# Patient Record
Sex: Female | Born: 1965 | Race: White | Hispanic: No | Marital: Married | Smoking: Former smoker
Health system: Southern US, Community
[De-identification: ages and names within clinical notes are randomized; demographics above are authoritative.]

## PROBLEM LIST (undated history)

## (undated) DIAGNOSIS — M797 Fibromyalgia: Secondary | ICD-10-CM

## (undated) DIAGNOSIS — G4733 Obstructive sleep apnea (adult) (pediatric): Secondary | ICD-10-CM

## (undated) DIAGNOSIS — M1990 Unspecified osteoarthritis, unspecified site: Secondary | ICD-10-CM

## (undated) DIAGNOSIS — G629 Polyneuropathy, unspecified: Secondary | ICD-10-CM

## (undated) DIAGNOSIS — J45909 Unspecified asthma, uncomplicated: Secondary | ICD-10-CM

---

## 2004-09-22 ENCOUNTER — Emergency Department: Payer: Self-pay | Admitting: Emergency Medicine

## 2004-12-11 ENCOUNTER — Emergency Department: Payer: Self-pay | Admitting: Emergency Medicine

## 2005-06-23 ENCOUNTER — Emergency Department: Payer: Self-pay | Admitting: Emergency Medicine

## 2005-06-23 ENCOUNTER — Ambulatory Visit: Payer: Self-pay | Admitting: Emergency Medicine

## 2005-06-23 IMAGING — US US EXTREM LOW VENOUS*R*
1 series · 17 of 24 positions shown · non-contrast
Comparison: none

REASON FOR EXAM: Right lower extremity pain, evaluate for DVT
COMMENTS:

[Series 1: us extrem low venous*right* · 17 of 27 slices shown]
[im 1/27]
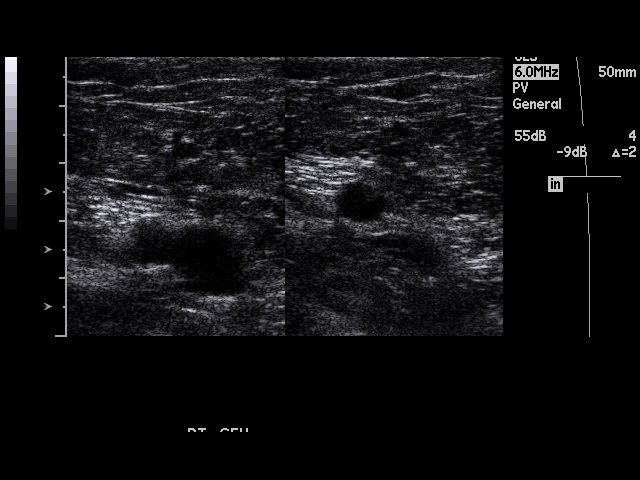
[im 3/27]
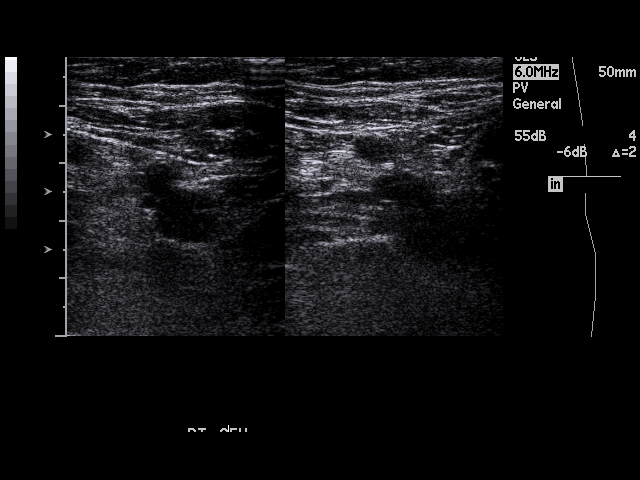
[im 4/27]
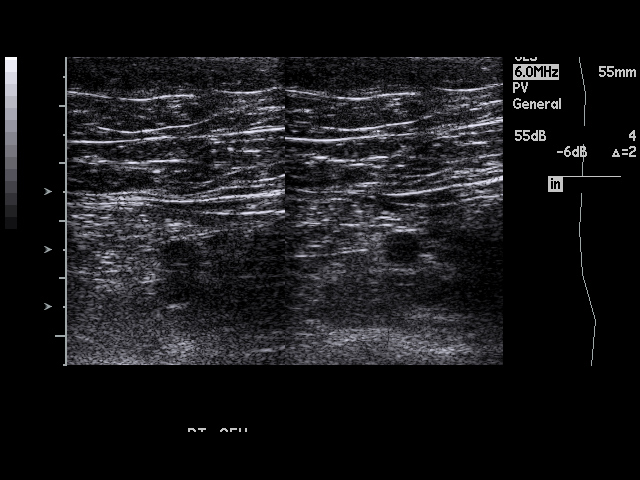
[im 5/27]
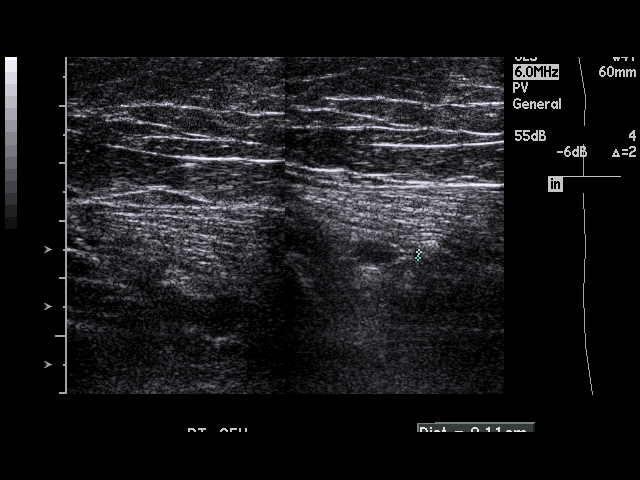
[im 7/27]
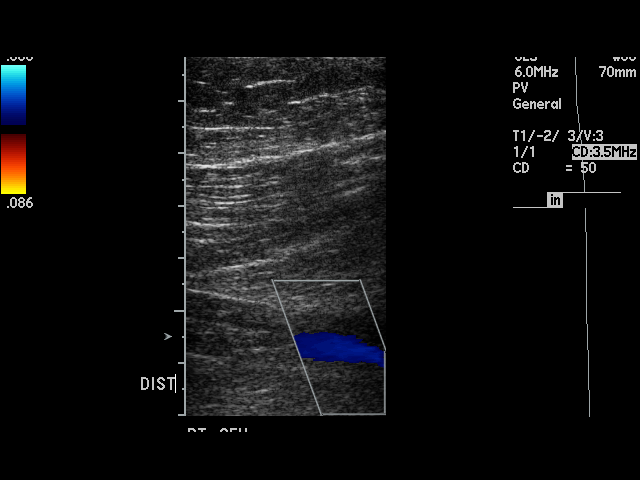
[im 8/27]
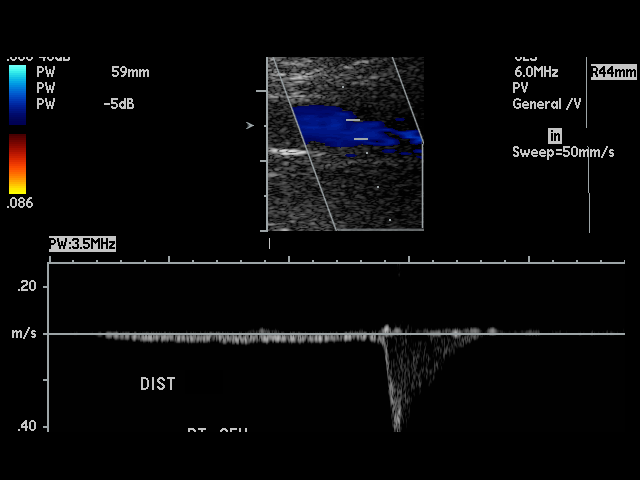
[im 11/27]
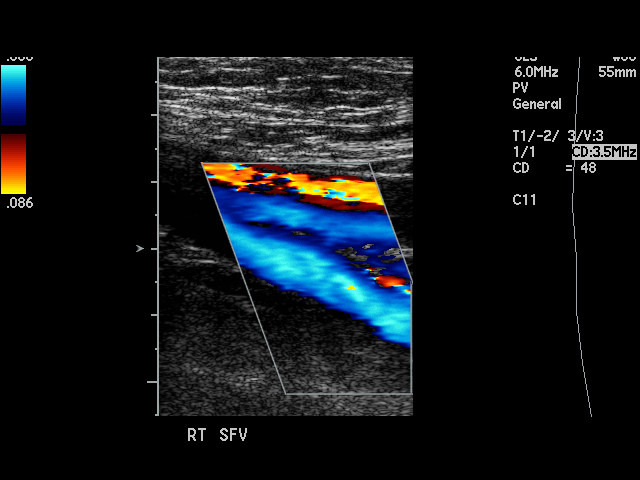
[im 12/27]
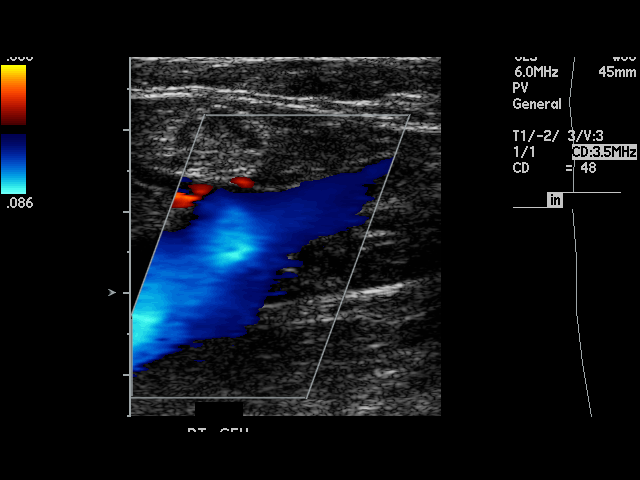
[im 14/27]
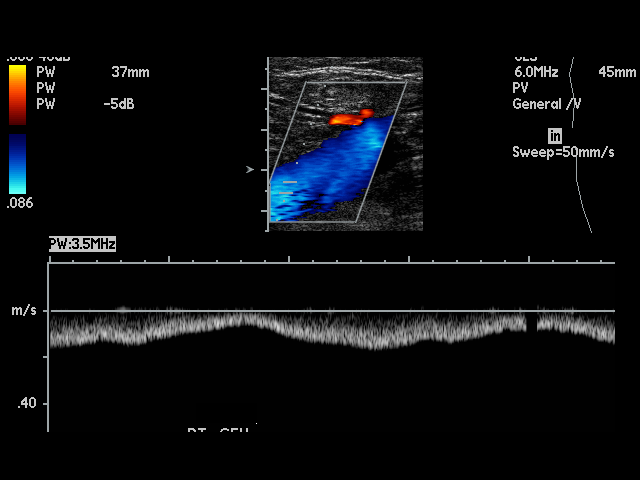
[im 15/27]
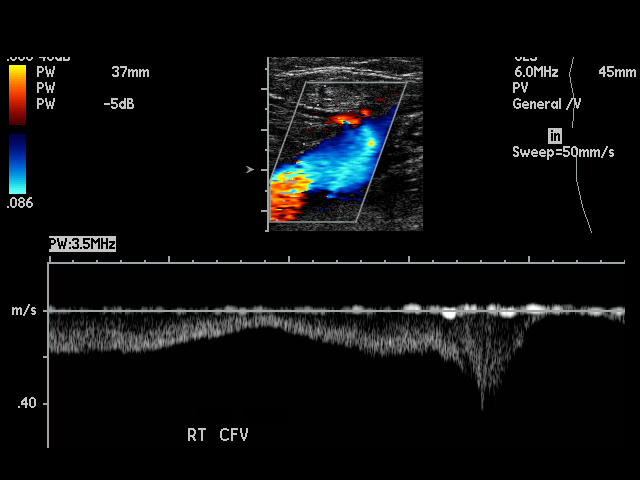
[im 16/27]
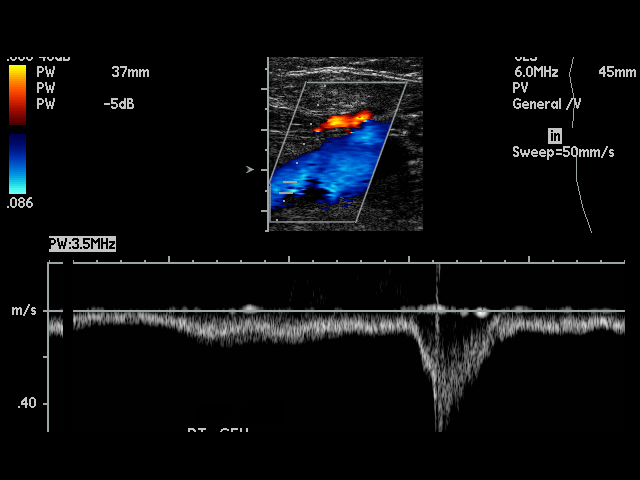
[im 19/27]
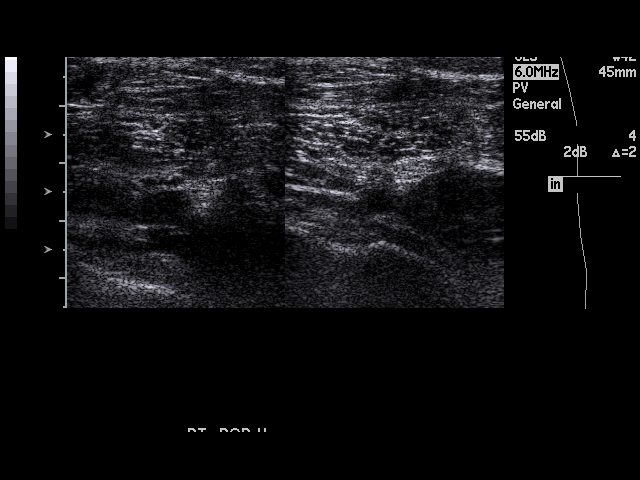
[im 20/27]
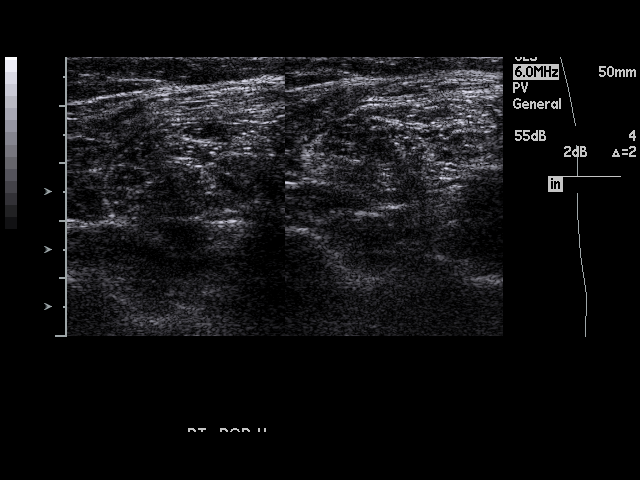
[im 22/27]
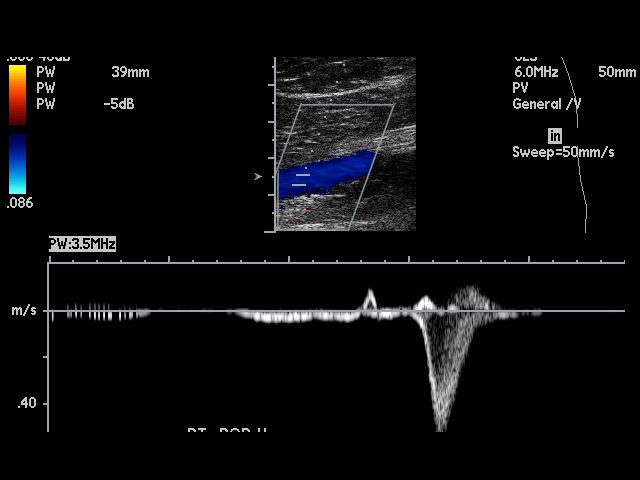
[im 23/27]
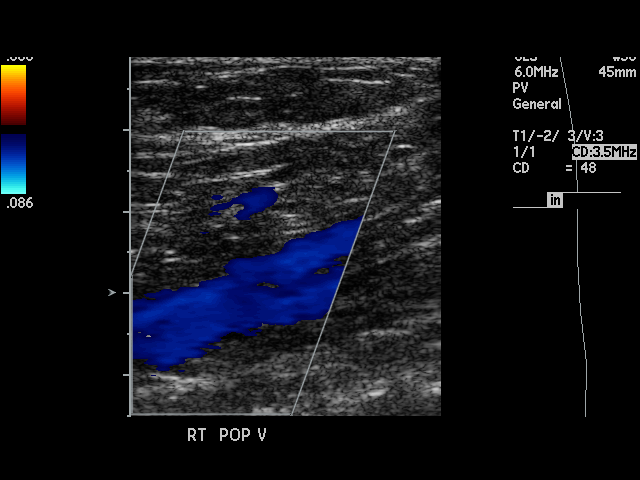
[im 24/27]
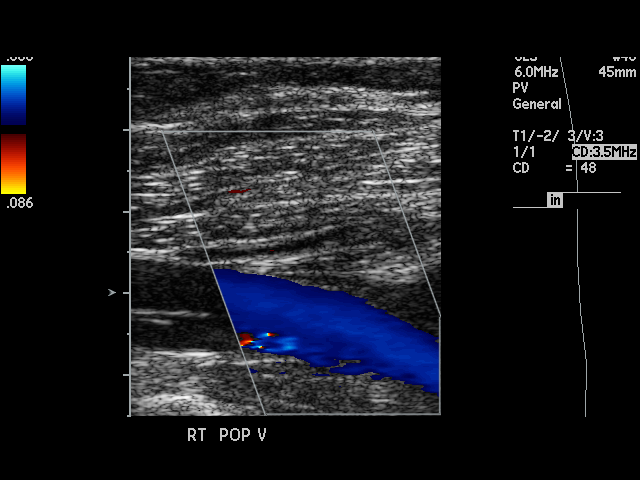
[im 27/27]
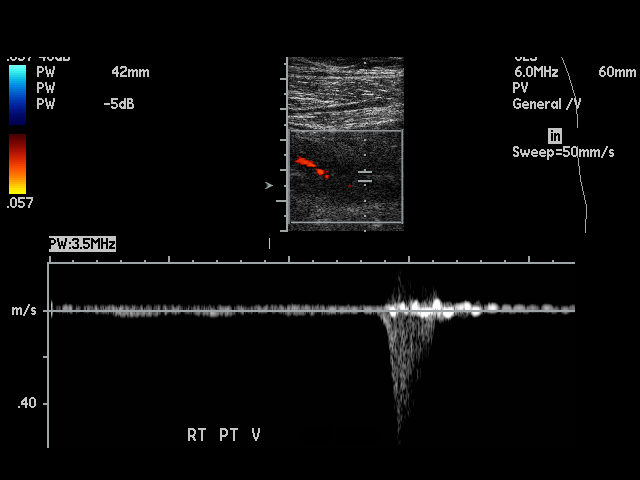

[17 of 24 positions shown; findings below may reference images not displayed]

PROCEDURE:     US  - US DOPPLER LOW EXTR RIGHT  - [DATE] [DATE]

RESULT:     RIGHT lower extremity deep venous ultrasound and color Doppler
examination is performed.

The phasic, augmentation and Valsalva flow waveforms are normal. The RIGHT
femoral and popliteal vein shows normal compressibility. Doppler examination
shows no occlusion or evidence of deep venous thrombosis.
IMPRESSION: Normal study. No deep venous thrombosis is identified in the
RIGHT lower extremity.

## 2005-07-09 ENCOUNTER — Ambulatory Visit: Payer: Self-pay | Admitting: Family Medicine

## 2010-03-08 ENCOUNTER — Encounter: Payer: Self-pay | Admitting: Neurology

## 2010-11-19 ENCOUNTER — Ambulatory Visit: Payer: Self-pay | Admitting: Internal Medicine

## 2010-11-19 IMAGING — CR DG KNEE COMPLETE 4+V*R*
1 series · 5 of 5 positions shown · non-contrast
Comparison: none

REASON FOR EXAM: pain of rt Knee
COMMENTS:

PROCEDURE:     DXR - DXR KNEE RT COMP WITH OBLIQUES  - [DATE]  [DATE]
RESULT:     Images of the right knee demonstrate no evidence of fracture,
dislocation or radiopaque foreign body. Moderate DJD is demonstrated.

[Series 1: view not recorded · 0.17mm/px · 5 of 5 slices shown]
[im 1/5]
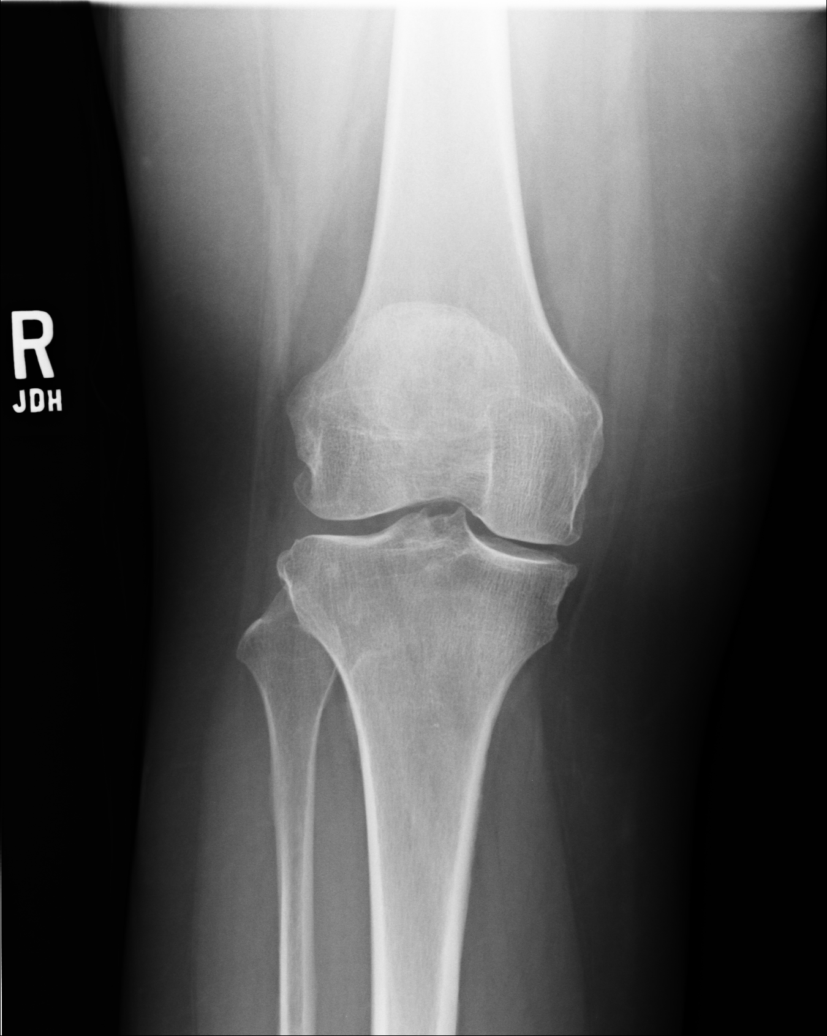
[im 2/5]
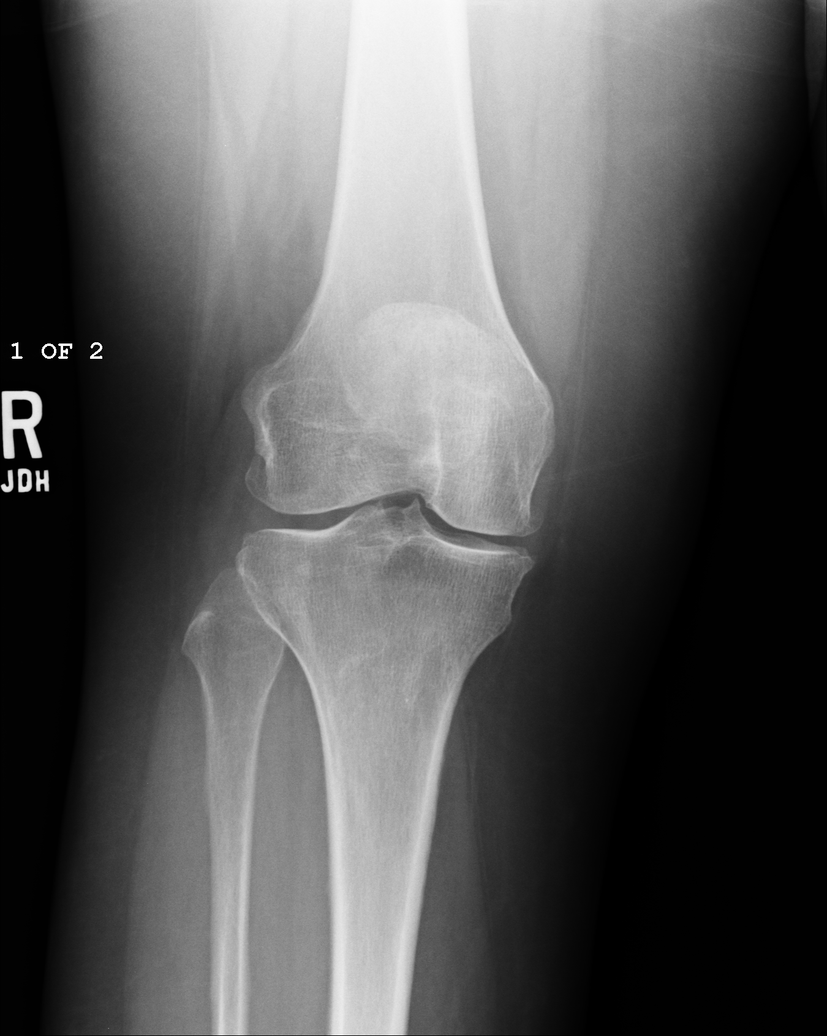
[im 3/5]
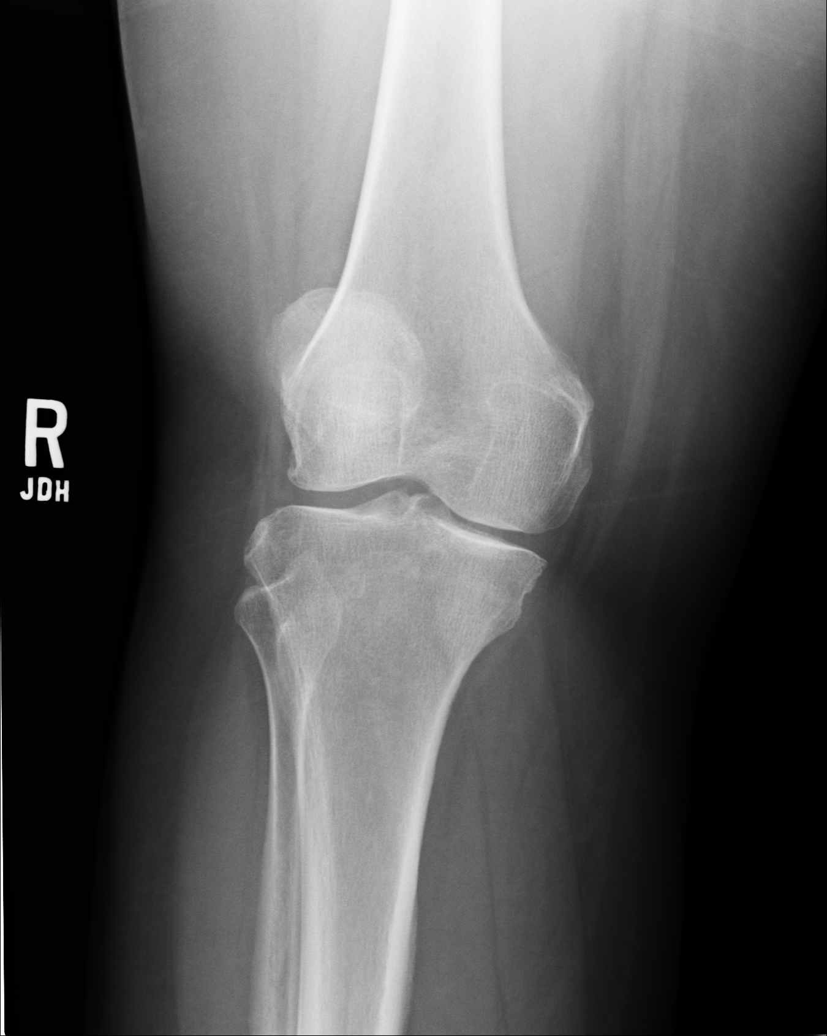
[im 4/5]
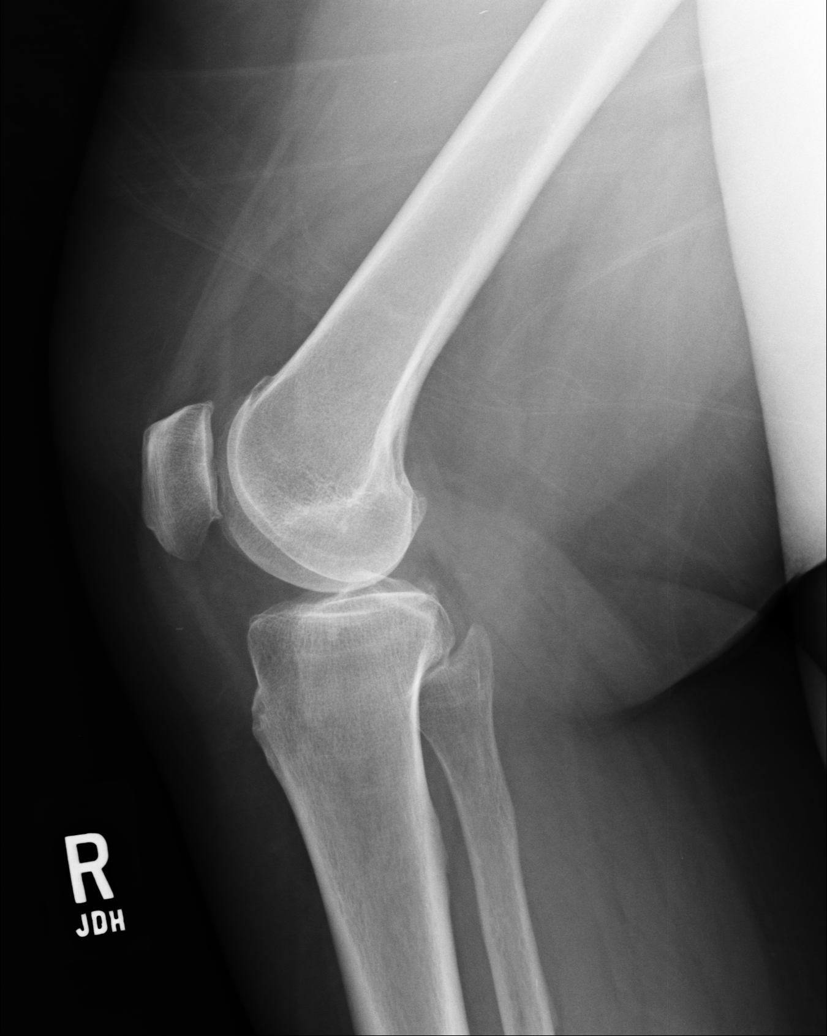
[im 5/5]
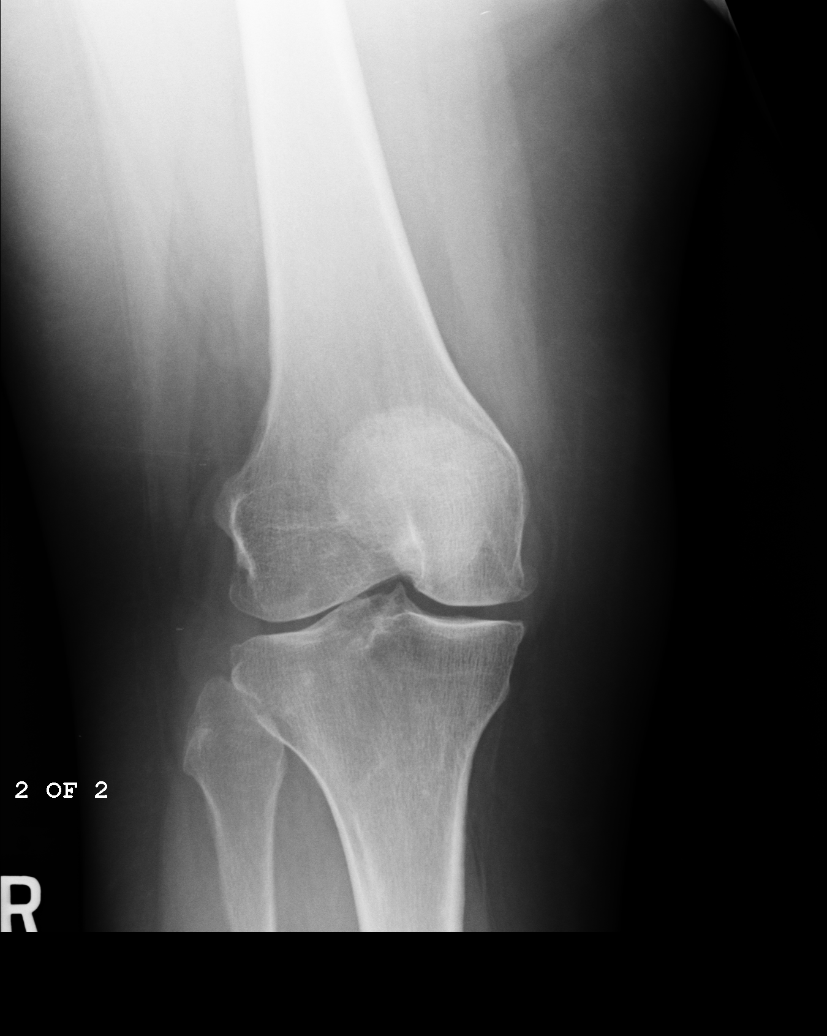

[5 of 5 positions shown; findings below may reference images not displayed]

IMPRESSION: Please see above.

## 2011-10-13 ENCOUNTER — Ambulatory Visit: Payer: Self-pay

## 2011-10-13 IMAGING — CR DG LUMBAR SPINE 2-3V
1 series · 3 of 3 positions shown · non-contrast
Comparison: none

REASON FOR EXAM: Chronic Pain, Back and Balance problems, FAX TO
[REDACTED]
COMMENTS:

PROCEDURE:     DXR - DXR LUMBAR SPINE AP AND LATERAL  - [DATE]  [DATE]
RESULT:     Lumbar spine images show grossly normal alignment. Vertebral
body heights are maintained. Minimal hypertrophic degenerative endplate
spurring is present. Facet hypertrophy is present in the lower lumbar region.

[Series 1: ap · 0.17mm/px · 3 of 3 slices shown]
[im 1/3]
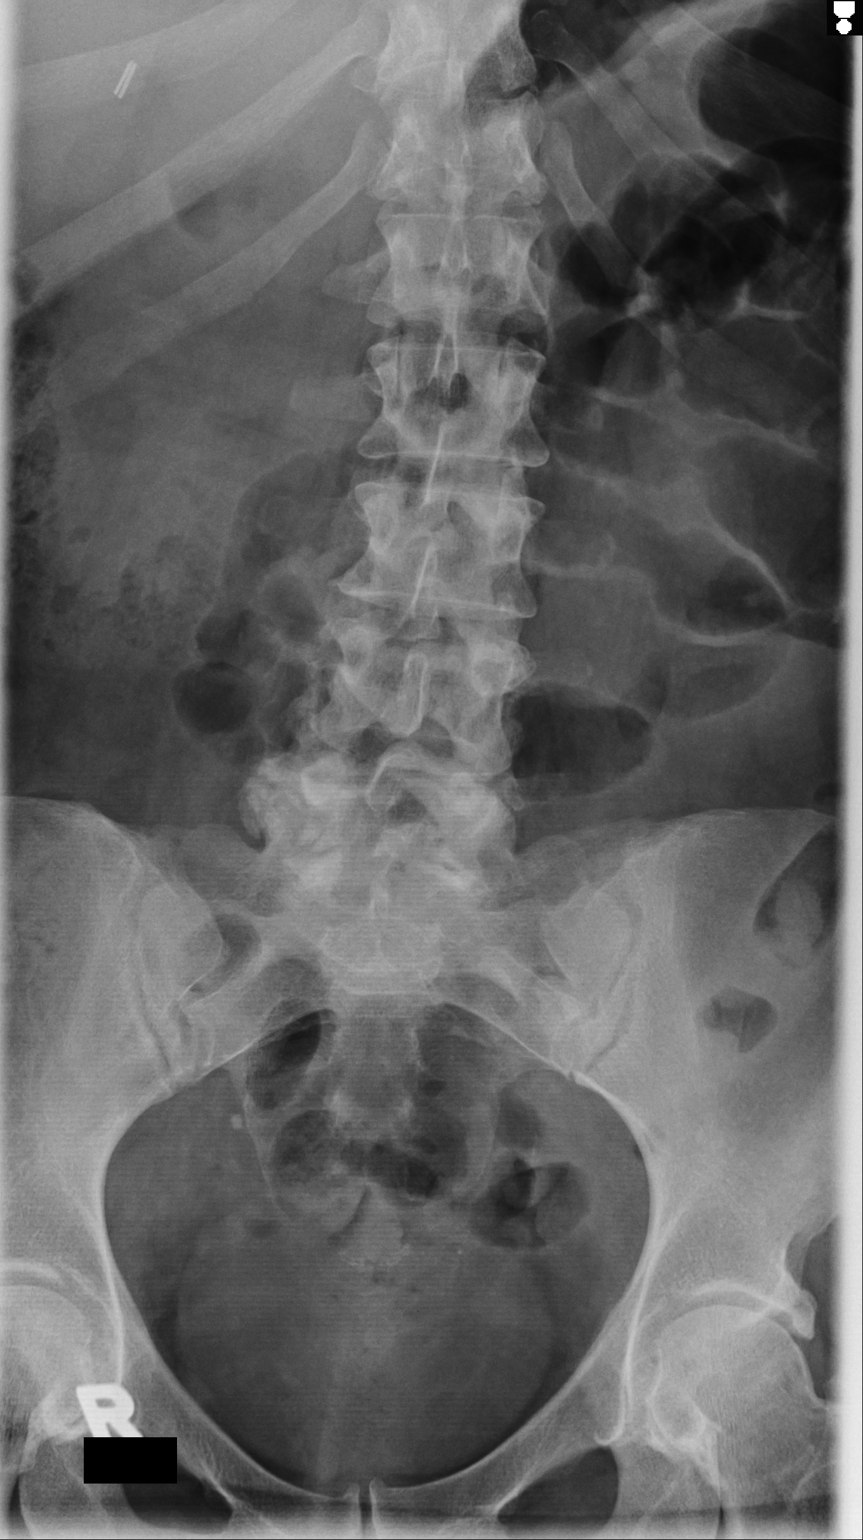
[im 2/3]
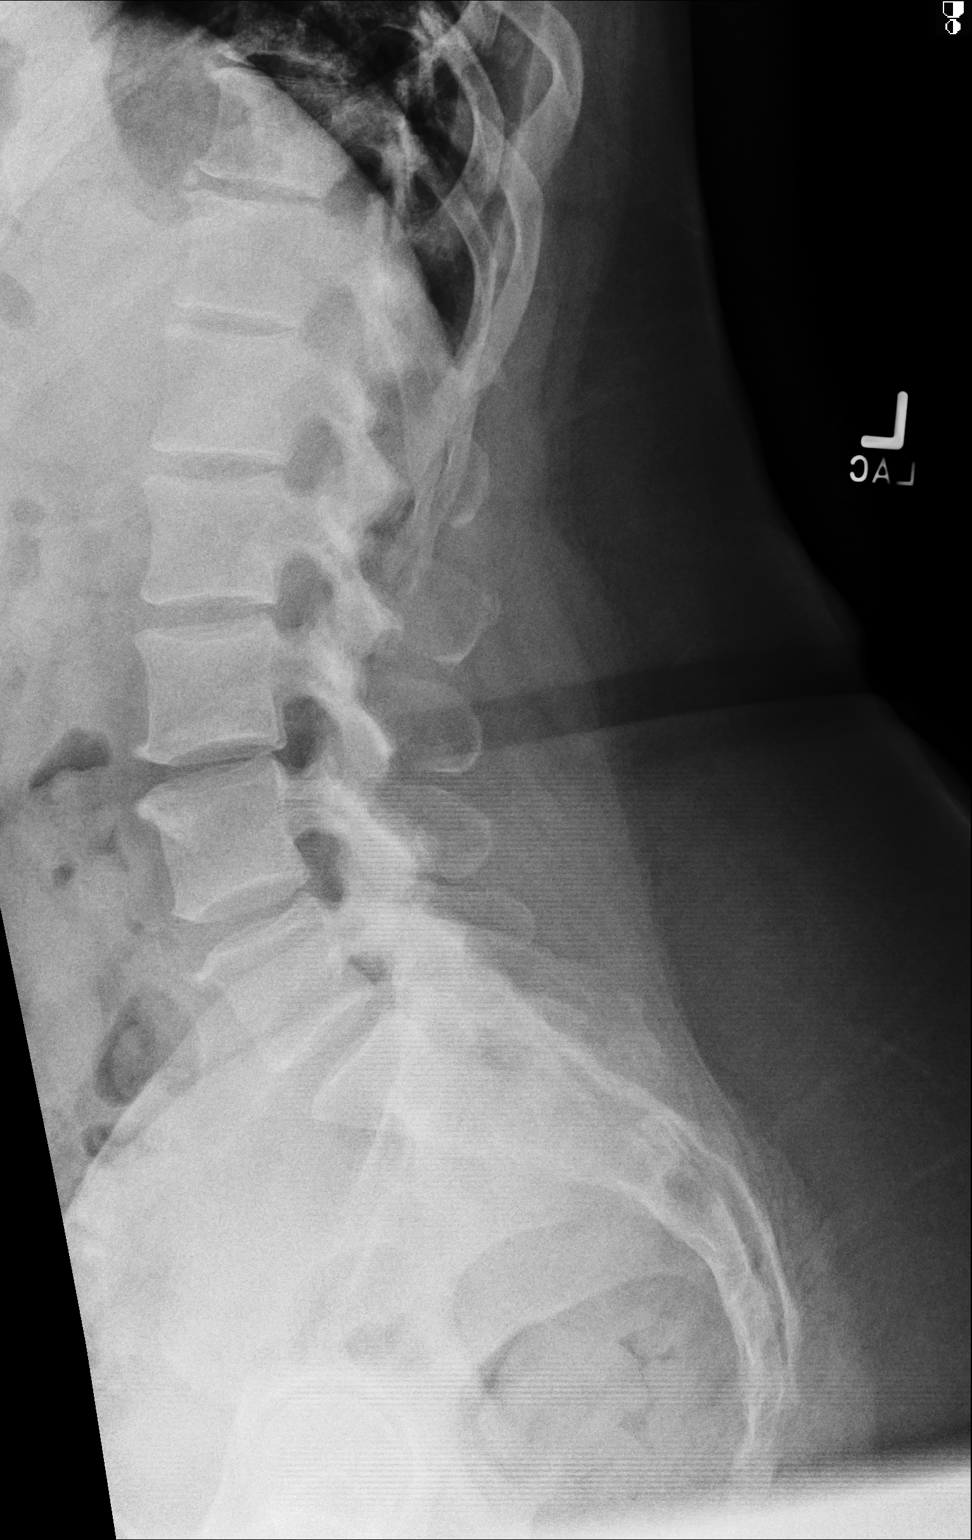
[im 3/3]
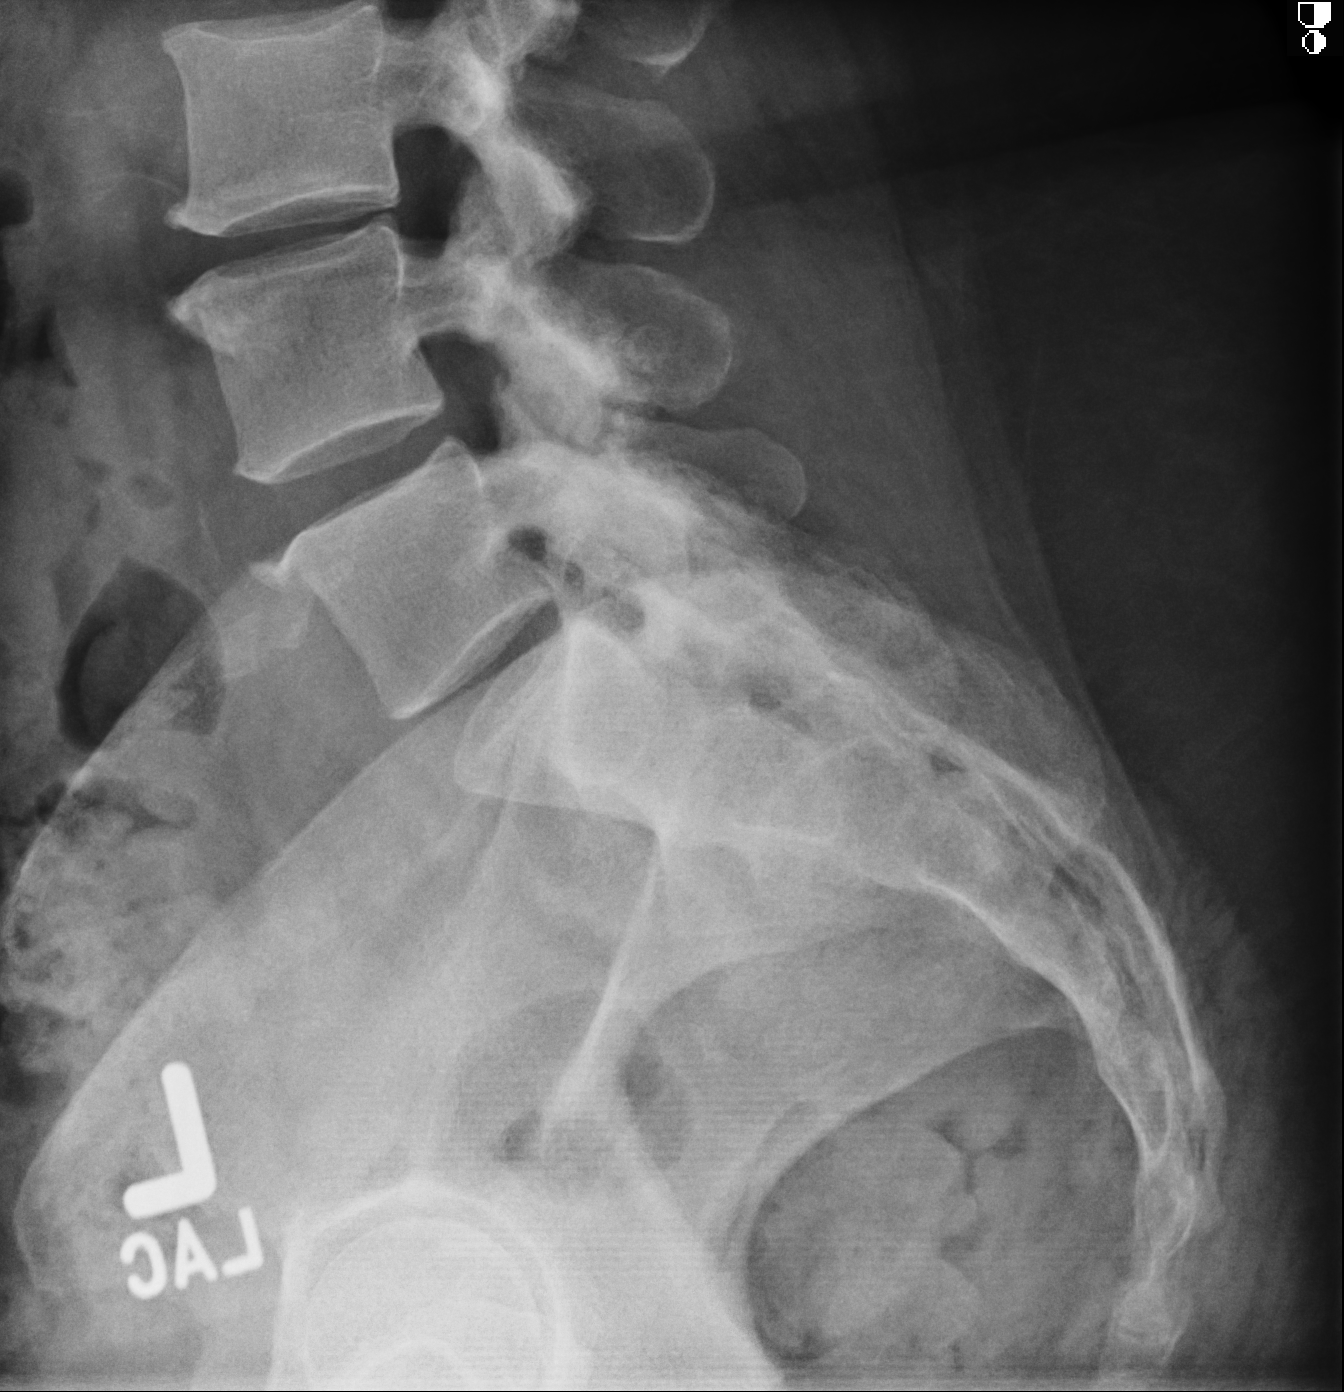

[3 of 3 positions shown; findings below may reference images not displayed]

IMPRESSION: 1. No acute bony abnormality evident.

[REDACTED]

## 2013-04-23 ENCOUNTER — Emergency Department: Payer: Self-pay | Admitting: Emergency Medicine

## 2013-04-23 LAB — URINALYSIS, COMPLETE
BILIRUBIN, UR: NEGATIVE
Blood: NEGATIVE
Glucose,UR: NEGATIVE mg/dL (ref 0–75)
KETONE: NEGATIVE
Leukocyte Esterase: NEGATIVE
Nitrite: NEGATIVE
PROTEIN: NEGATIVE
Ph: 7 (ref 4.5–8.0)
SPECIFIC GRAVITY: 1.004 (ref 1.003–1.030)

## 2013-04-23 LAB — DRUG SCREEN, URINE

## 2013-04-23 LAB — COMPREHENSIVE METABOLIC PANEL
AST: 10 U/L — AB (ref 15–37)
Albumin: 3.3 g/dL — ABNORMAL LOW (ref 3.4–5.0)
Alkaline Phosphatase: 125 U/L — ABNORMAL HIGH
Anion Gap: 4 — ABNORMAL LOW (ref 7–16)
BUN: 7 mg/dL (ref 7–18)
Bilirubin,Total: 0.2 mg/dL (ref 0.2–1.0)
CALCIUM: 9.2 mg/dL (ref 8.5–10.1)
CHLORIDE: 104 mmol/L (ref 98–107)
Co2: 28 mmol/L (ref 21–32)
Creatinine: 0.79 mg/dL (ref 0.60–1.30)
GLUCOSE: 87 mg/dL (ref 65–99)
OSMOLALITY: 269 (ref 275–301)
Potassium: 4 mmol/L (ref 3.5–5.1)
SGPT (ALT): 10 U/L — ABNORMAL LOW (ref 12–78)
SODIUM: 136 mmol/L (ref 136–145)
TOTAL PROTEIN: 8.4 g/dL — AB (ref 6.4–8.2)

## 2013-04-23 LAB — CBC
HCT: 43.8 % (ref 35.0–47.0)
HGB: 15 g/dL (ref 12.0–16.0)
MCH: 32.4 pg (ref 26.0–34.0)
MCHC: 34.1 g/dL (ref 32.0–36.0)
MCV: 95 fL (ref 80–100)
Platelet: 197 10*3/uL (ref 150–440)
RBC: 4.61 10*6/uL (ref 3.80–5.20)
RDW: 15.1 % — ABNORMAL HIGH (ref 11.5–14.5)
WBC: 8.6 10*3/uL (ref 3.6–11.0)

## 2013-04-23 LAB — ETHANOL: Ethanol %: <0.003 % (ref 0.000–0.080)

## 2013-04-23 LAB — ACETAMINOPHEN LEVEL

## 2013-04-23 LAB — SALICYLATE LEVEL: Salicylates, Serum: 5.4 mg/dL — ABNORMAL HIGH

## 2013-12-03 ENCOUNTER — Emergency Department: Payer: Self-pay | Admitting: Emergency Medicine

## 2013-12-03 LAB — CBC
HCT: 39.7 % (ref 35.0–47.0)
HGB: 12.7 g/dL (ref 12.0–16.0)
MCH: 30.6 pg (ref 26.0–34.0)
MCHC: 32.1 g/dL (ref 32.0–36.0)
MCV: 95 fL (ref 80–100)
PLATELETS: 202 10*3/uL (ref 150–440)
RBC: 4.17 10*6/uL (ref 3.80–5.20)
RDW: 16.4 % — ABNORMAL HIGH (ref 11.5–14.5)
WBC: 10.8 10*3/uL (ref 3.6–11.0)

## 2013-12-03 LAB — COMPREHENSIVE METABOLIC PANEL
ANION GAP: 6 — AB (ref 7–16)
AST: 15 U/L (ref 15–37)
Albumin: 3 g/dL — ABNORMAL LOW (ref 3.4–5.0)
Alkaline Phosphatase: 121 U/L — ABNORMAL HIGH
BUN: 4 mg/dL — ABNORMAL LOW (ref 7–18)
Bilirubin,Total: 0.2 mg/dL (ref 0.2–1.0)
CO2: 29 mmol/L (ref 21–32)
Calcium, Total: 8.6 mg/dL (ref 8.5–10.1)
Chloride: 104 mmol/L (ref 98–107)
Creatinine: 0.91 mg/dL (ref 0.60–1.30)
EGFR (Non-African Amer.): >60
Glucose: 108 mg/dL — ABNORMAL HIGH (ref 65–99)
OSMOLALITY: 275 (ref 275–301)
POTASSIUM: 4 mmol/L (ref 3.5–5.1)
SGPT (ALT): 13 U/L — ABNORMAL LOW
Sodium: 139 mmol/L (ref 136–145)
Total Protein: 7.7 g/dL (ref 6.4–8.2)

## 2013-12-03 LAB — URINALYSIS, COMPLETE
BILIRUBIN, UR: NEGATIVE
Blood: NEGATIVE
GLUCOSE, UR: NEGATIVE mg/dL (ref 0–75)
KETONE: NEGATIVE
LEUKOCYTE ESTERASE: NEGATIVE
Nitrite: NEGATIVE
PROTEIN: NEGATIVE
Ph: 6 (ref 4.5–8.0)
SPECIFIC GRAVITY: 1.003 (ref 1.003–1.030)
Squamous Epithelial: 7
WBC UR: 2 /HPF (ref 0–5)

## 2013-12-03 LAB — DRUG SCREEN, URINE
Amphetamines, Ur Screen: NEGATIVE (ref ?–1000)
Barbiturates, Ur Screen: NEGATIVE (ref ?–200)
Benzodiazepine, Ur Scrn: POSITIVE (ref ?–200)
CANNABINOID 50 NG, UR ~~LOC~~: NEGATIVE (ref ?–50)
Cocaine Metabolite,Ur ~~LOC~~: NEGATIVE (ref ?–300)
MDMA (ECSTASY) UR SCREEN: NEGATIVE (ref ?–500)
Methadone, Ur Screen: NEGATIVE (ref ?–300)
OPIATE, UR SCREEN: NEGATIVE (ref ?–300)
Phencyclidine (PCP) Ur S: NEGATIVE (ref ?–25)
TRICYCLIC, UR SCREEN: NEGATIVE (ref ?–1000)

## 2013-12-03 LAB — SALICYLATE LEVEL: Salicylates, Serum: 4.3 mg/dL — ABNORMAL HIGH

## 2013-12-03 LAB — ACETAMINOPHEN LEVEL: Acetaminophen: <2 ug/mL

## 2013-12-03 LAB — ETHANOL: Ethanol: <3 mg/dL (ref 0–80)

## 2014-06-08 NOTE — Consult Note (Signed)
PATIENT NAME:  Brandi Holland, Brandi Holland MR#:  [id number] DATE OF BIRTH:  06/06/1965  DATE OF CONSULTATION:  12/04/2013  REFERRING PHYSICIAN:   CONSULTING PHYSICIAN:  Brandi Amel, MD  IDENTIFYING INFORMATION AND REASON FOR CONSULTATION: A 49 year old woman with a long history of PTSD came to the hospital voluntarily for anxiety symptoms.   HISTORY OF PRESENT ILLNESS: Information obtained from the patient and the chart. The patient says that her flashbacks have been getting worse and she has been feeling more panicky. Feels anxious most of the time. Has panic-like symptoms several times a day. She has flashbacks but she is not psychotic about them. She had a particularly bad one yesterday when smell of kerosene got her upset and agitated and she could not calm down. The patient is not reporting psychotic symptoms. She does not have any suicidal or homicidal ideation whatsoever. She is currently getting follow-up treatment at [facility] regularly and has a doctor and a therapist there. She has not been compliant with the recommended medicine that they have given her.   PAST PSYCHIATRIC HISTORY: The patient denies any past psychiatric hospitalization, has no history of suicide attempts in the past. She is currently taking Seroquel 200 mg at night and Xanax 1 mg twice a day and has been prescribed Paxil but is not taking it. She says she has had other medicines in the past including Latuda, Celexa and Cymbalta which were not helpful.   SOCIAL HISTORY: The patient lives at home with her husband. Feels the husband is supportive, but he works during the day leaving her alone which makes her increasingly anxious. The patient is not able to work outside the home.   FAMILY HISTORY: Sister and mother both have significant mental health problems.   SUBSTANCE ABUSE HISTORY: Does not drink, does not abuse drugs, has no history of alcohol or drug abuse, does smoke cigarettes.   PAST MEDICAL HISTORY: The patient has  chronic pain related to fibromyalgia, is overweight but does not have any other clear medical problems.   CURRENT MEDICATIONS: Seroquel 200 mg at bedtime and Xanax 1 mg b.i.d. Does not report any other medicines.   ALLERGIES: PREVACID, SULFA DRUGS.   REVIEW OF SYSTEMS: The patient is reporting anxiety, nervousness, poor sleep at night, poor energy during the day. Denies suicidal ideation. Denies homicidal ideation. Denies any hallucinations or psychotic symptoms. The rest of the physical review of systems, besides chronic joint pain, is negative.   MENTAL STATUS EXAMINATION: A somewhat disheveled-looking woman, looks her stated age, cooperative with the interview. Eye contact good. Psychomotor activity normal. Speech normal rate, tone and volume. Affect dysphoric and anxious. Mood stated as being nervous. Thoughts are lucid. No loosening of associations or delusions. No evidence of psychotic thinking. Denies suicidal or homicidal ideation. She is alert and oriented x4. She can remember 3/3 objects immediately and at three minutes. Judgment and insight appear to be adequate. Normal intelligence. Normal fund of knowledge.   LABORATORY RESULTS: The pregnancy test is negative. Salicylates just slightly elevated at 4.3. Chemistry panel: Slightly elevated glucose 108, low ALT at 13, elevated alkaline phosphatase 121. CBC normal. Urinalysis unremarkable. Drug screen positive for benzodiazepines.   VITAL SIGNS: In the Emergency Room, blood pressure is 148/75, respirations 16, pulse 85, and temperature 98.6.   ASSESSMENT: A 49 year old woman with posttraumatic stress disorder, panic-like symptoms. No suicidality. No psychosis. No homicidality. No evidence of acute dangerousness to self or others. Has been recommended by her outpatient doctor to take Paxil,  but has resisted because she is afraid of it. She has an [facility] this [facility]. At this point, she does not meet criteria for inpatient  hospitalization.   TREATMENT PLAN: Supportive and educational counseling done. Reviewed use of medicine and encouraged her to try the Paxil. Encouraged her to get in touch with [facility] for questions about her treatment. She can be released from the Emergency Room. Involuntary commitment is not required.   DIAGNOSIS, PRINCIPAL AND PRIMARY:  AXIS I: Posttraumatic stress disorder.   SECONDARY DIAGNOSES: AXIS I: Anxiety disorder, not otherwise specified.  AXIS II: Deferred.  AXIS III: Fibromyalgia.  AXIS V: Functioning at time of evaluation is 49. ____________________________ Brandi AmelJohn T. Edin Kon, MD jtc:sb D: 12/04/2013 17:03:24 ET T: 12/04/2013 17:16:00 ET JOB#: [id number]  cc: Brandi AmelJohn T. Diamonds Lippard, MD, <Dictator> Brandi AmelJOHN T Heidee Audi MD ELECTRONICALLY SIGNED 12/05/2013 16:16
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
## 2020-06-18 ENCOUNTER — Emergency Department
Admission: EM | Admit: 2020-06-18 | Discharge: 2020-06-18 | Disposition: A | Discharge status: Home / Self Care | Payer: Medicaid Other | Attending: Emergency Medicine | Admitting: Emergency Medicine

## 2020-06-18 ENCOUNTER — Other Ambulatory Visit: Payer: Self-pay

## 2020-06-18 ENCOUNTER — Emergency Department: Payer: Medicaid Other

## 2020-06-18 DIAGNOSIS — Z5321 Procedure and treatment not carried out due to patient leaving prior to being seen by health care provider: Secondary | ICD-10-CM | POA: Diagnosis not present

## 2020-06-18 DIAGNOSIS — M7989 Other specified soft tissue disorders: Secondary | ICD-10-CM | POA: Insufficient documentation

## 2020-06-18 LAB — BASIC METABOLIC PANEL
Anion gap: 10 (ref 5–15)
BUN: 19 mg/dL (ref 6–20)
CO2: 22 mmol/L (ref 22–32)
Calcium: 8.8 mg/dL — ABNORMAL LOW (ref 8.9–10.3)
Chloride: 90 mmol/L — ABNORMAL LOW (ref 98–111)
Creatinine, Ser: 1.16 mg/dL — ABNORMAL HIGH (ref 0.44–1.00)
GFR, Estimated: 55 mL/min — ABNORMAL LOW (ref >60)
Glucose, Bld: 144 mg/dL — ABNORMAL HIGH (ref 70–99)
Potassium: 3.9 mmol/L (ref 3.5–5.1)
Sodium: 122 mmol/L — ABNORMAL LOW (ref 135–145)

## 2020-06-18 LAB — CBC
HCT: 29.2 % — ABNORMAL LOW (ref 36.0–46.0)
Hemoglobin: 9.8 g/dL — ABNORMAL LOW (ref 12.0–15.0)
MCH: 31.2 pg (ref 26.0–34.0)
MCHC: 33.6 g/dL (ref 30.0–36.0)
MCV: 93 fL (ref 80.0–100.0)
Platelets: 255 10*3/uL (ref 150–400)
RBC: 3.14 MIL/uL — ABNORMAL LOW (ref 3.87–5.11)
RDW: 14.5 % (ref 11.5–15.5)
WBC: 10.4 10*3/uL (ref 4.0–10.5)
nRBC: 0.2 % (ref 0.0–0.2)

## 2020-06-18 IMAGING — CR DG CHEST 2V
1 series · 2 of 2 positions shown · non-contrast
Comparison: Chest x-ray [DATE].

CLINICAL DATA: 55-year-old female with complaint of bilateral leg
swelling for the past several weeks.

EXAM:
CHEST - 2 VIEW

[Series 1: dg chest 2 view · 0.14mm/px · 2 of 2 slices shown]
[im 1/2]
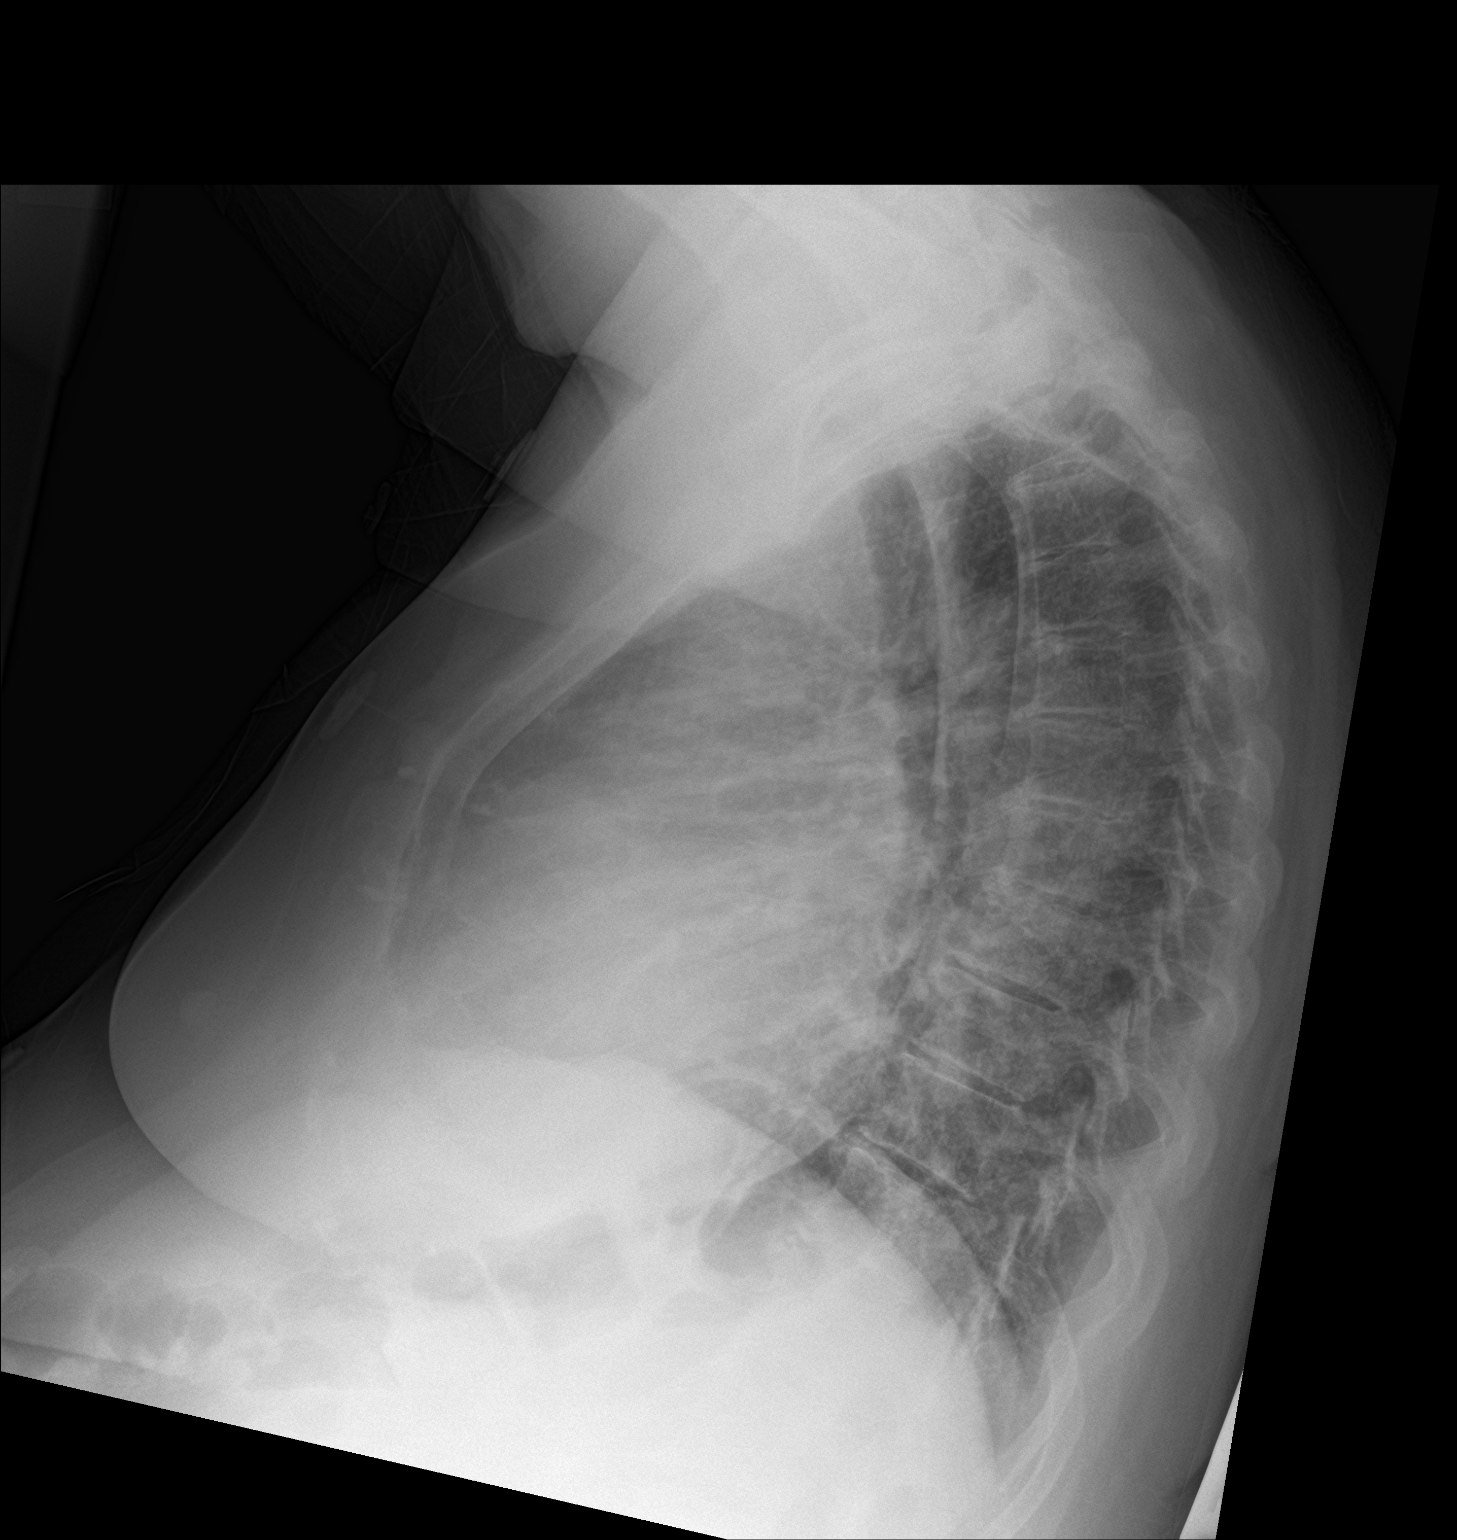
[im 2/2]
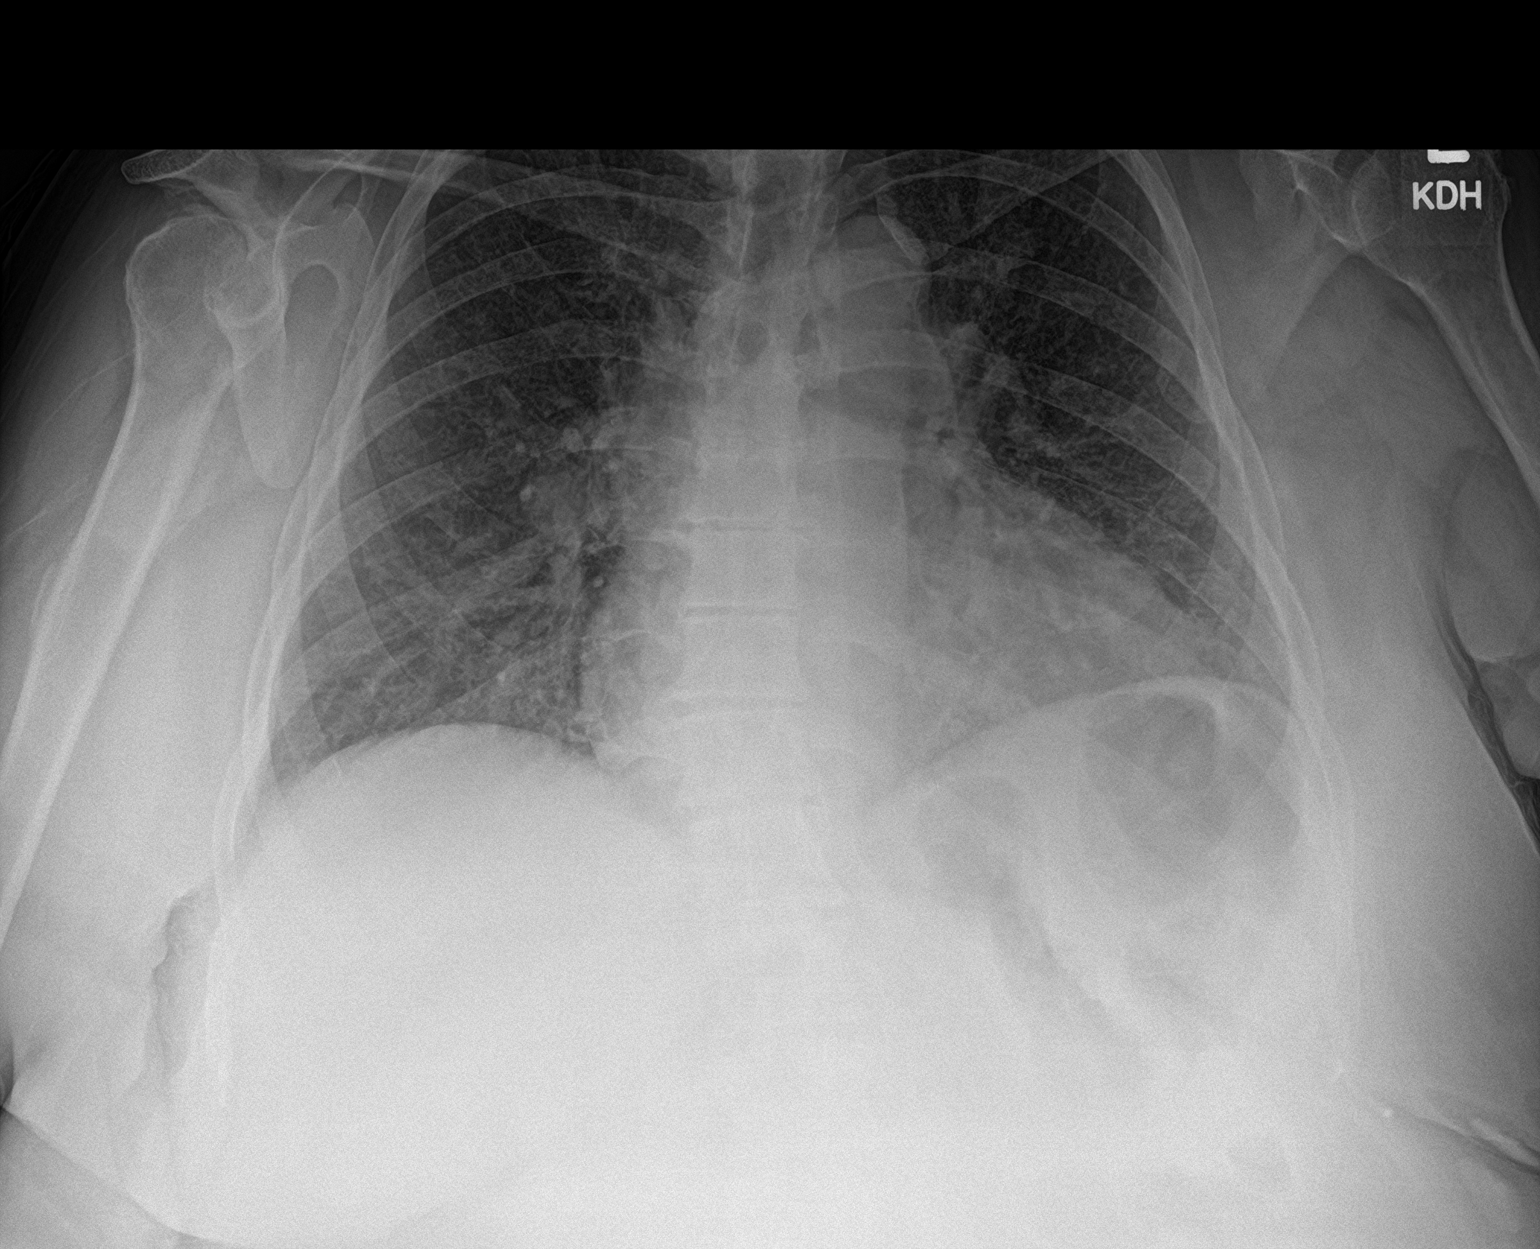

[2 of 2 positions shown; findings below may reference images not displayed]

FINDINGS: Lung volumes are normal. No consolidative airspace disease. There is
cephalization of the pulmonary vasculature and slight indistinctness
of the interstitial markings suggestive of mild pulmonary edema. No
definite pleural effusions. No pneumothorax. No definite suspicious
appearing pulmonary nodules or masses are noted. Mild cardiomegaly.
Upper mediastinal contours are within normal limits.
IMPRESSION: 1. The appearance of the chest suggests mild congestive heart
failure, as above.

## 2020-06-18 NOTE — ED Triage Notes (Signed)
Pt to ED POV for bilateral leg swelling for a couple weeks. Denies cp States she is unsure if an insect bit her but is having swelling in both legs Pain relieved with elevation  Minimal swelling noted in BLE

## 2020-06-18 NOTE — ED Triage Notes (Signed)
First Nurse Note: Arrives from [facility] for ED evaluation of bilateral lower extremity swelling x 1 week.  Patient states the concern was DVT vs CHF.

## 2020-06-20 ENCOUNTER — Telehealth: Payer: Self-pay | Admitting: Emergency Medicine

## 2020-06-20 NOTE — Telephone Encounter (Signed)
Called patient due to left emergency department before provider exam to inquire about condition and follow up plans. No answer and no voicemail 

## 2020-07-16 ENCOUNTER — Emergency Department: Payer: Medicaid Other

## 2020-07-16 ENCOUNTER — Inpatient Hospital Stay
Admission: EM | Admit: 2020-07-16 | Discharge: 2020-08-01 | DRG: 064 | Disposition: A | Discharge status: Home Health | Payer: Medicaid Other | Attending: Internal Medicine | Admitting: Internal Medicine

## 2020-07-16 DIAGNOSIS — F17210 Nicotine dependence, cigarettes, uncomplicated: Secondary | ICD-10-CM | POA: Diagnosis present

## 2020-07-16 DIAGNOSIS — Z9049 Acquired absence of other specified parts of digestive tract: Secondary | ICD-10-CM

## 2020-07-16 DIAGNOSIS — M797 Fibromyalgia: Secondary | ICD-10-CM | POA: Diagnosis present

## 2020-07-16 DIAGNOSIS — E611 Iron deficiency: Secondary | ICD-10-CM | POA: Diagnosis present

## 2020-07-16 DIAGNOSIS — G928 Other toxic encephalopathy: Secondary | ICD-10-CM | POA: Diagnosis present

## 2020-07-16 DIAGNOSIS — Z882 Allergy status to sulfonamides status: Secondary | ICD-10-CM

## 2020-07-16 DIAGNOSIS — I5033 Acute on chronic diastolic (congestive) heart failure: Secondary | ICD-10-CM

## 2020-07-16 DIAGNOSIS — I351 Nonrheumatic aortic (valve) insufficiency: Secondary | ICD-10-CM

## 2020-07-16 DIAGNOSIS — Z9119 Patient's noncompliance with other medical treatment and regimen: Secondary | ICD-10-CM

## 2020-07-16 DIAGNOSIS — E538 Deficiency of other specified B group vitamins: Secondary | ICD-10-CM

## 2020-07-16 DIAGNOSIS — Z8262 Family history of osteoporosis: Secondary | ICD-10-CM

## 2020-07-16 DIAGNOSIS — Z825 Family history of asthma and other chronic lower respiratory diseases: Secondary | ICD-10-CM

## 2020-07-16 DIAGNOSIS — Z23 Encounter for immunization: Secondary | ICD-10-CM

## 2020-07-16 DIAGNOSIS — W06XXXA Fall from bed, initial encounter: Secondary | ICD-10-CM | POA: Diagnosis present

## 2020-07-16 DIAGNOSIS — E874 Mixed disorder of acid-base balance: Secondary | ICD-10-CM | POA: Diagnosis present

## 2020-07-16 DIAGNOSIS — H9190 Unspecified hearing loss, unspecified ear: Secondary | ICD-10-CM | POA: Diagnosis present

## 2020-07-16 DIAGNOSIS — R739 Hyperglycemia, unspecified: Secondary | ICD-10-CM | POA: Diagnosis present

## 2020-07-16 DIAGNOSIS — R7989 Other specified abnormal findings of blood chemistry: Secondary | ICD-10-CM | POA: Diagnosis present

## 2020-07-16 DIAGNOSIS — Z8349 Family history of other endocrine, nutritional and metabolic diseases: Secondary | ICD-10-CM

## 2020-07-16 DIAGNOSIS — E871 Hypo-osmolality and hyponatremia: Secondary | ICD-10-CM

## 2020-07-16 DIAGNOSIS — N179 Acute kidney failure, unspecified: Secondary | ICD-10-CM

## 2020-07-16 DIAGNOSIS — I480 Paroxysmal atrial fibrillation: Secondary | ICD-10-CM | POA: Diagnosis present

## 2020-07-16 DIAGNOSIS — Z823 Family history of stroke: Secondary | ICD-10-CM

## 2020-07-16 DIAGNOSIS — J45901 Unspecified asthma with (acute) exacerbation: Secondary | ICD-10-CM | POA: Diagnosis present

## 2020-07-16 DIAGNOSIS — E785 Hyperlipidemia, unspecified: Secondary | ICD-10-CM | POA: Diagnosis present

## 2020-07-16 DIAGNOSIS — Z881 Allergy status to other antibiotic agents status: Secondary | ICD-10-CM

## 2020-07-16 DIAGNOSIS — Z8249 Family history of ischemic heart disease and other diseases of the circulatory system: Secondary | ICD-10-CM

## 2020-07-16 DIAGNOSIS — R0602 Shortness of breath: Secondary | ICD-10-CM

## 2020-07-16 DIAGNOSIS — G4733 Obstructive sleep apnea (adult) (pediatric): Secondary | ICD-10-CM

## 2020-07-16 DIAGNOSIS — R609 Edema, unspecified: Secondary | ICD-10-CM

## 2020-07-16 DIAGNOSIS — Z6841 Body Mass Index (BMI) 40.0 and over, adult: Secondary | ICD-10-CM

## 2020-07-16 DIAGNOSIS — Z8673 Personal history of transient ischemic attack (TIA), and cerebral infarction without residual deficits: Secondary | ICD-10-CM

## 2020-07-16 DIAGNOSIS — J45909 Unspecified asthma, uncomplicated: Secondary | ICD-10-CM

## 2020-07-16 DIAGNOSIS — Z79899 Other long term (current) drug therapy: Secondary | ICD-10-CM

## 2020-07-16 DIAGNOSIS — J9621 Acute and chronic respiratory failure with hypoxia: Secondary | ICD-10-CM | POA: Diagnosis present

## 2020-07-16 DIAGNOSIS — R630 Anorexia: Secondary | ICD-10-CM | POA: Diagnosis present

## 2020-07-16 DIAGNOSIS — I808 Phlebitis and thrombophlebitis of other sites: Secondary | ICD-10-CM | POA: Diagnosis present

## 2020-07-16 DIAGNOSIS — G9340 Encephalopathy, unspecified: Secondary | ICD-10-CM | POA: Diagnosis present

## 2020-07-16 DIAGNOSIS — E878 Other disorders of electrolyte and fluid balance, not elsewhere classified: Secondary | ICD-10-CM | POA: Diagnosis present

## 2020-07-16 DIAGNOSIS — J9601 Acute respiratory failure with hypoxia: Secondary | ICD-10-CM

## 2020-07-16 DIAGNOSIS — N1831 Chronic kidney disease, stage 3a: Secondary | ICD-10-CM | POA: Diagnosis present

## 2020-07-16 DIAGNOSIS — N3289 Other specified disorders of bladder: Secondary | ICD-10-CM | POA: Diagnosis present

## 2020-07-16 DIAGNOSIS — Z20822 Contact with and (suspected) exposure to covid-19: Secondary | ICD-10-CM | POA: Diagnosis present

## 2020-07-16 DIAGNOSIS — R4182 Altered mental status, unspecified: Secondary | ICD-10-CM | POA: Diagnosis present

## 2020-07-16 DIAGNOSIS — I130 Hypertensive heart and chronic kidney disease with heart failure and stage 1 through stage 4 chronic kidney disease, or unspecified chronic kidney disease: Secondary | ICD-10-CM | POA: Diagnosis present

## 2020-07-16 DIAGNOSIS — D693 Immune thrombocytopenic purpura: Secondary | ICD-10-CM

## 2020-07-16 DIAGNOSIS — D696 Thrombocytopenia, unspecified: Secondary | ICD-10-CM | POA: Diagnosis present

## 2020-07-16 DIAGNOSIS — J441 Chronic obstructive pulmonary disease with (acute) exacerbation: Secondary | ICD-10-CM | POA: Diagnosis present

## 2020-07-16 DIAGNOSIS — Z7401 Bed confinement status: Secondary | ICD-10-CM

## 2020-07-16 DIAGNOSIS — G629 Polyneuropathy, unspecified: Secondary | ICD-10-CM | POA: Diagnosis present

## 2020-07-16 DIAGNOSIS — I6340 Cerebral infarction due to embolism of unspecified cerebral artery: Principal | ICD-10-CM | POA: Diagnosis present

## 2020-07-16 DIAGNOSIS — D500 Iron deficiency anemia secondary to blood loss (chronic): Secondary | ICD-10-CM

## 2020-07-16 DIAGNOSIS — A419 Sepsis, unspecified organism: Secondary | ICD-10-CM

## 2020-07-16 DIAGNOSIS — I639 Cerebral infarction, unspecified: Secondary | ICD-10-CM

## 2020-07-16 DIAGNOSIS — E8809 Other disorders of plasma-protein metabolism, not elsewhere classified: Secondary | ICD-10-CM | POA: Diagnosis present

## 2020-07-16 DIAGNOSIS — D589 Hereditary hemolytic anemia, unspecified: Secondary | ICD-10-CM | POA: Diagnosis present

## 2020-07-16 DIAGNOSIS — Z809 Family history of malignant neoplasm, unspecified: Secondary | ICD-10-CM

## 2020-07-16 DIAGNOSIS — F05 Delirium due to known physiological condition: Secondary | ICD-10-CM | POA: Diagnosis present

## 2020-07-16 DIAGNOSIS — Z818 Family history of other mental and behavioral disorders: Secondary | ICD-10-CM

## 2020-07-16 HISTORY — DX: Unspecified osteoarthritis, unspecified site: M19.90

## 2020-07-16 HISTORY — DX: Polyneuropathy, unspecified: G62.9

## 2020-07-16 HISTORY — DX: Obstructive sleep apnea (adult) (pediatric): G47.33

## 2020-07-16 HISTORY — DX: Unspecified asthma, uncomplicated: J45.909

## 2020-07-16 HISTORY — DX: Fibromyalgia: M79.7

## 2020-07-16 LAB — BLOOD GAS, VENOUS
Acid-Base Excess: 0.8 mmol/L (ref 0.0–2.0)
Bicarbonate: 25.2 mmol/L (ref 20.0–28.0)
O2 Saturation: 80.2 %
Patient temperature: 37
pCO2, Ven: 38 mmHg — ABNORMAL LOW (ref 44.0–60.0)
pH, Ven: 7.43 (ref 7.250–7.430)
pO2, Ven: 43 mmHg (ref 32.0–45.0)

## 2020-07-16 LAB — COMPREHENSIVE METABOLIC PANEL
ALT: 12 U/L (ref 0–44)
AST: 30 U/L (ref 15–41)
Albumin: 2.8 g/dL — ABNORMAL LOW (ref 3.5–5.0)
Alkaline Phosphatase: 64 U/L (ref 38–126)
Anion gap: 15 (ref 5–15)
BUN: 70 mg/dL — ABNORMAL HIGH (ref 6–20)
CO2: 22 mmol/L (ref 22–32)
Calcium: 8.8 mg/dL — ABNORMAL LOW (ref 8.9–10.3)
Chloride: 93 mmol/L — ABNORMAL LOW (ref 98–111)
Creatinine, Ser: 3.15 mg/dL — ABNORMAL HIGH (ref 0.44–1.00)
GFR, Estimated: 17 mL/min — ABNORMAL LOW (ref >60)
Glucose, Bld: 123 mg/dL — ABNORMAL HIGH (ref 70–99)
Potassium: 4.3 mmol/L (ref 3.5–5.1)
Sodium: 130 mmol/L — ABNORMAL LOW (ref 135–145)
Total Bilirubin: 2.6 mg/dL — ABNORMAL HIGH (ref 0.3–1.2)
Total Protein: 7 g/dL (ref 6.5–8.1)

## 2020-07-16 LAB — RESP PANEL BY RT-PCR (FLU A&B, COVID) ARPGX2
Influenza A by PCR: NEGATIVE
Influenza B by PCR: NEGATIVE
SARS Coronavirus 2 by RT PCR: NEGATIVE

## 2020-07-16 LAB — LACTIC ACID, PLASMA: Lactic Acid, Venous: 3.2 mmol/L (ref 0.5–1.9)

## 2020-07-16 LAB — PROCALCITONIN: Procalcitonin: 0.35 ng/mL

## 2020-07-16 LAB — TROPONIN I (HIGH SENSITIVITY): Troponin I (High Sensitivity): 708 ng/L (ref <18)

## 2020-07-16 LAB — CK: Total CK: 266 U/L — ABNORMAL HIGH (ref 38–234)

## 2020-07-16 LAB — ETHANOL: Alcohol, Ethyl (B): <10 mg/dL (ref <10)

## 2020-07-16 LAB — LIPASE, BLOOD: Lipase: 32 U/L (ref 11–51)

## 2020-07-16 LAB — D-DIMER, QUANTITATIVE: D-Dimer, Quant: 2.6 ug/mL-FEU — ABNORMAL HIGH (ref 0.00–0.50)

## 2020-07-16 IMAGING — DX DG CHEST 1V PORT
1 series · 1 of 1 positions shown · non-contrast
Comparison: None.

CLINICAL DATA: Shortness of breath

EXAM:
PORTABLE CHEST 1 VIEW

[chest ap]
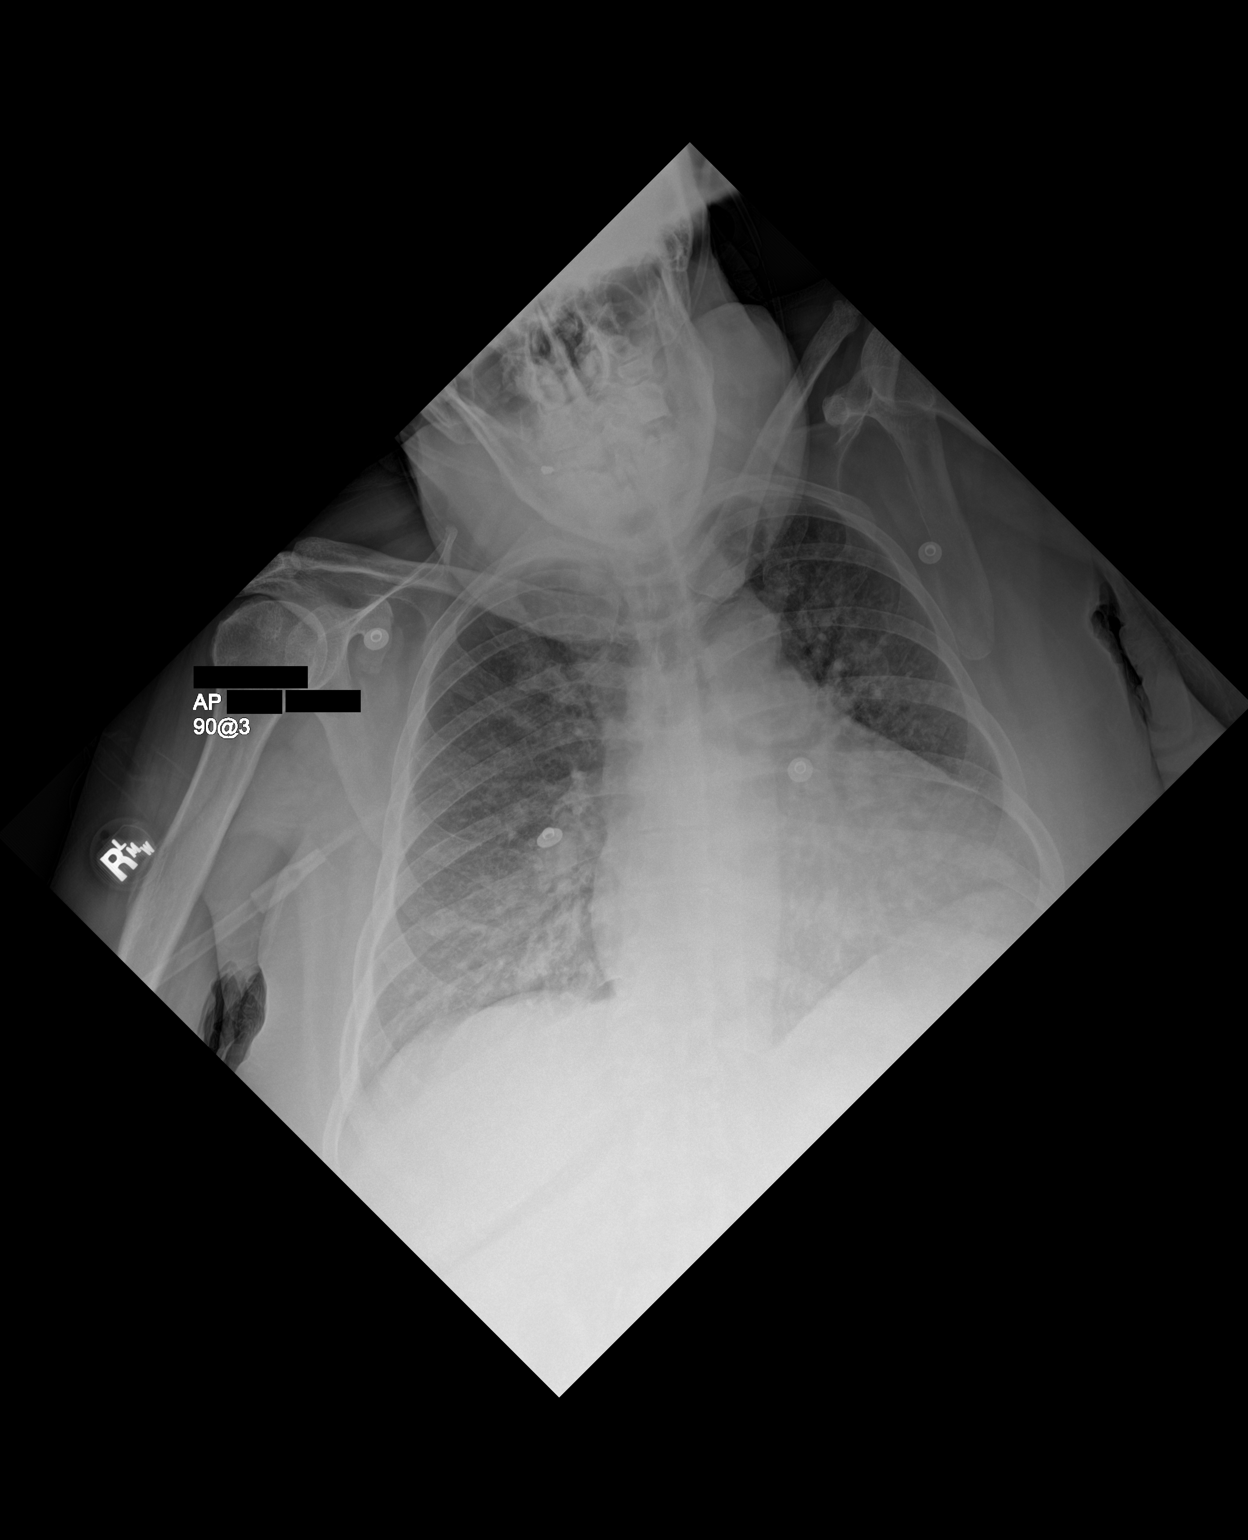

[1 of 1 positions shown; findings below may reference images not displayed]

FINDINGS: Low lung volumes. Mild cardiomegaly. Diffuse interstitial prominence
throughout the lungs. No effusions or pneumothorax. No acute bony
abnormality.
IMPRESSION: Cardiomegaly. Diffuse interstitial prominence could reflect
interstitial edema or less likely atypical infection.

## 2020-07-16 IMAGING — CT CT HEAD W/O CM
3 series · 16 of 46 positions shown, 19 images · non-contrast
Comparison: None.

CLINICAL DATA: Dizziness

EXAM:
CT HEAD WITHOUT CONTRAST
TECHNIQUE: Contiguous axial images were obtained from the base of the skull
through the vertex without intravenous contrast.

[Series 3: head wo · axial · 0.41mm/px · z∈[-61,+59]mm · 10 of 29 slices shown, 13 images]
[im 3/29  brain]
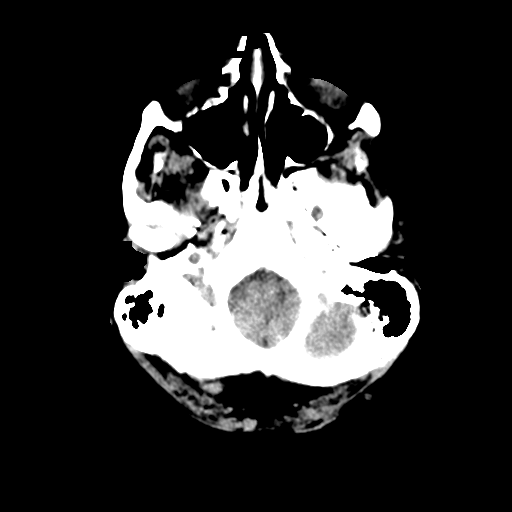
[im 3/29  bone]
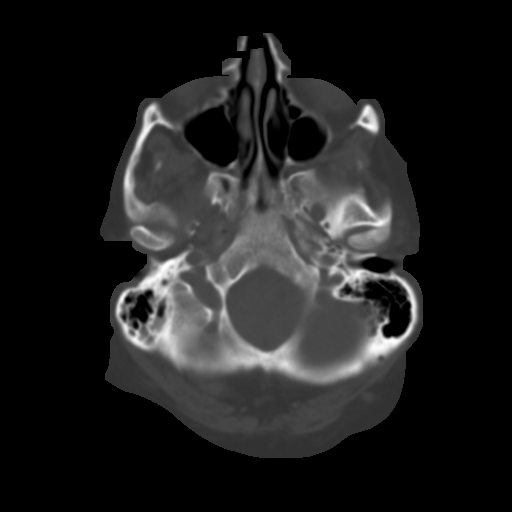
[im 6/29  brain]
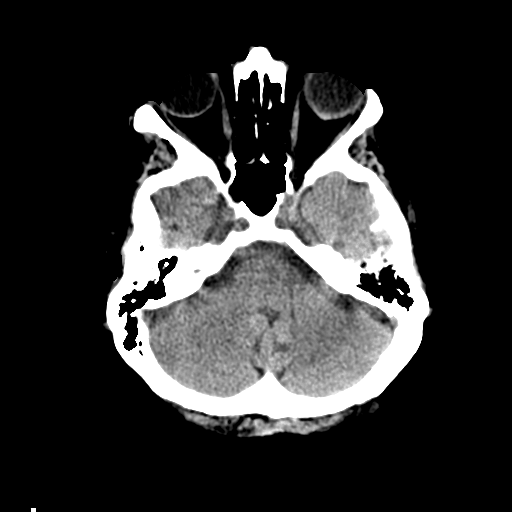
[im 8/29  brain]
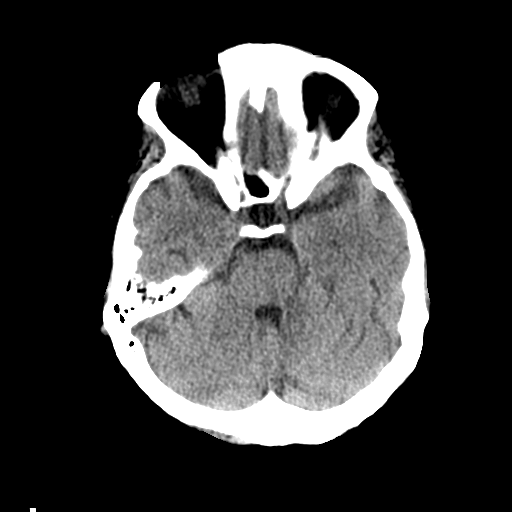
[im 11/29  brain]
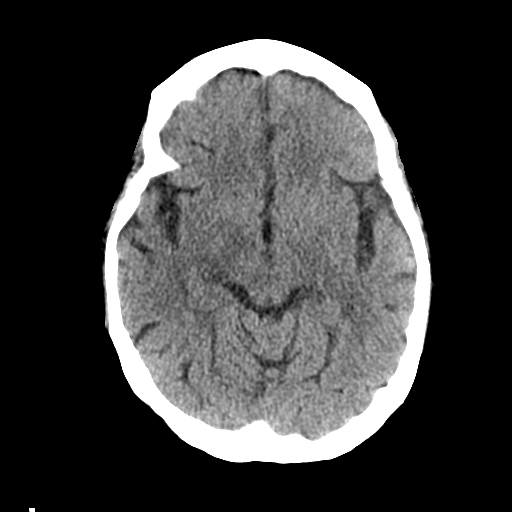
[im 14/29  brain]
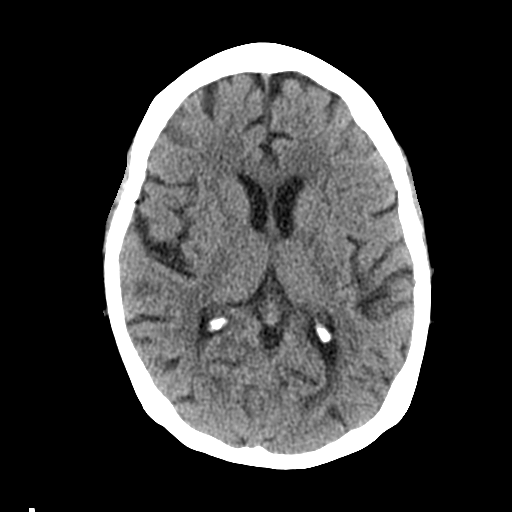
[im 14/29  bone]
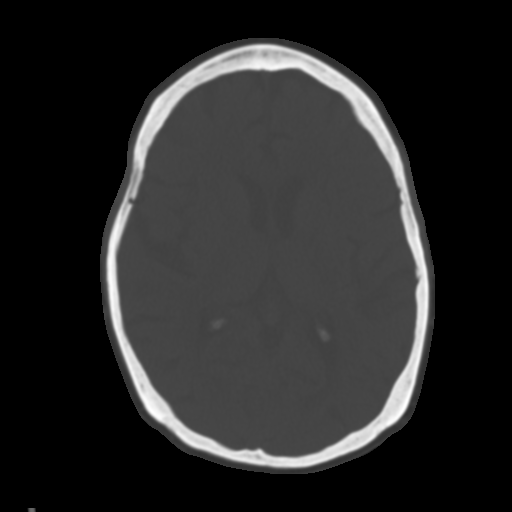
[im 16/29  brain]
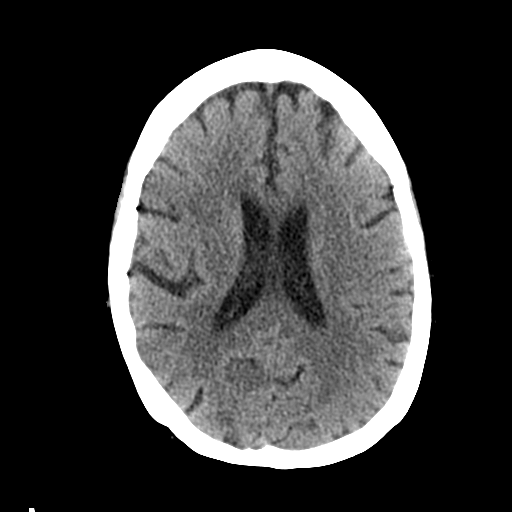
[im 19/29  brain]
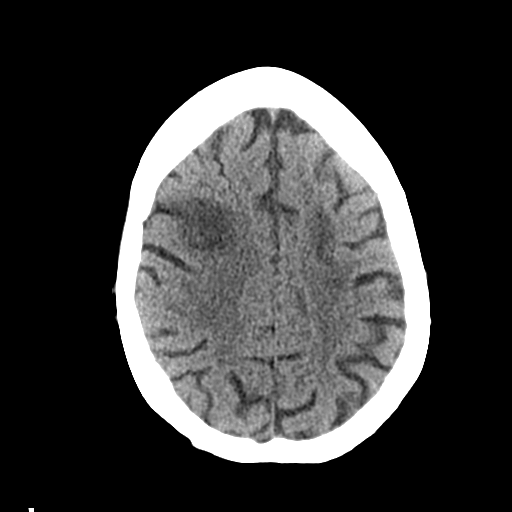
[im 22/29  brain]
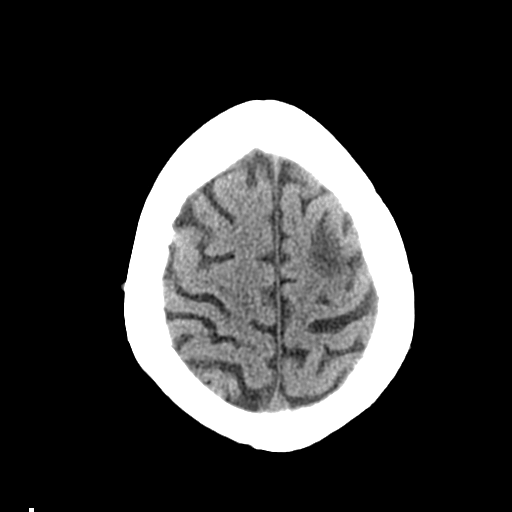
[im 24/29  brain]
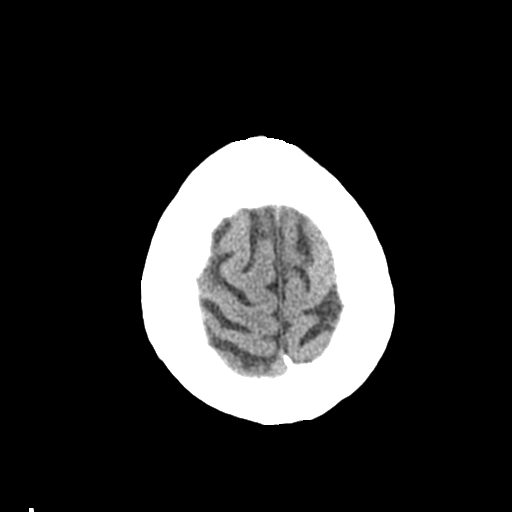
[im 24/29  bone]
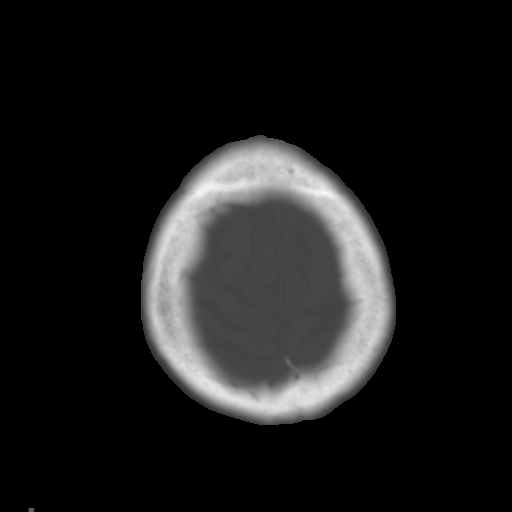
[im 27/29  brain]
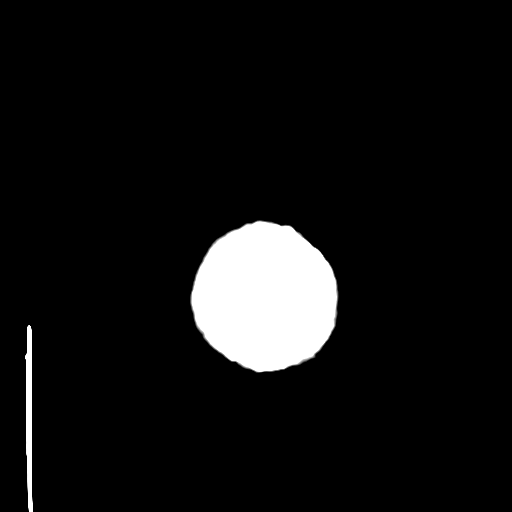

[Series 4: coronal soft tissue · coronal · 0.30mm/px · 3 of 61 slices shown]
[im 21/61  brain]
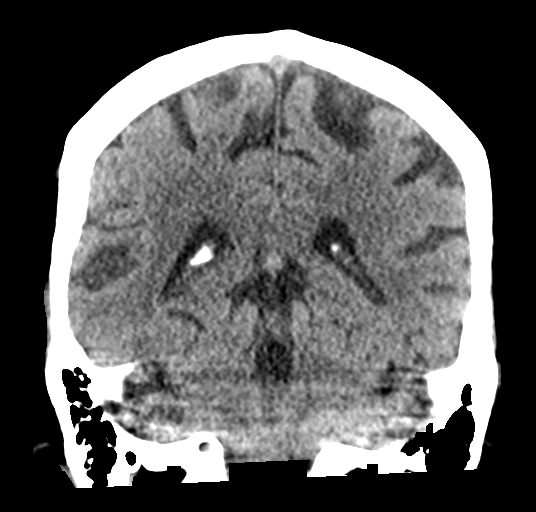
[im 27/61  brain]
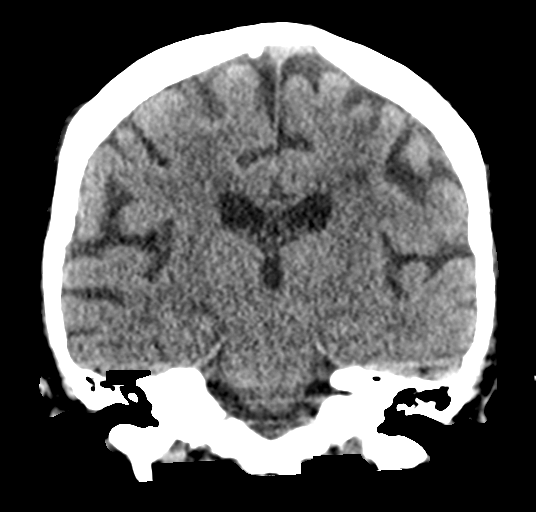
[im 34/61  brain]
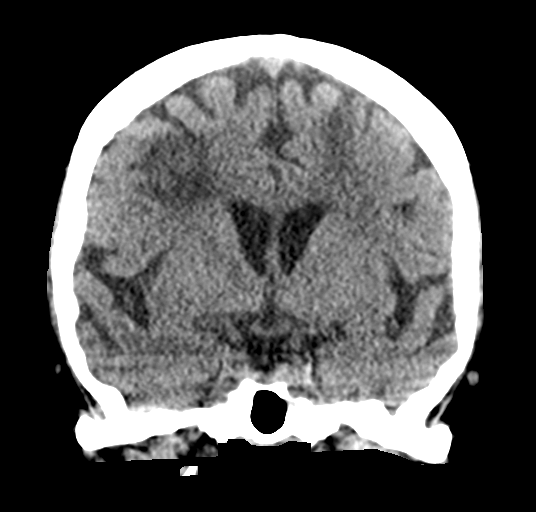

[Series 5: sagittal soft tissue · sagittal · 0.28mm/px · 3 of 48 slices shown]
[im 16/48  brain]
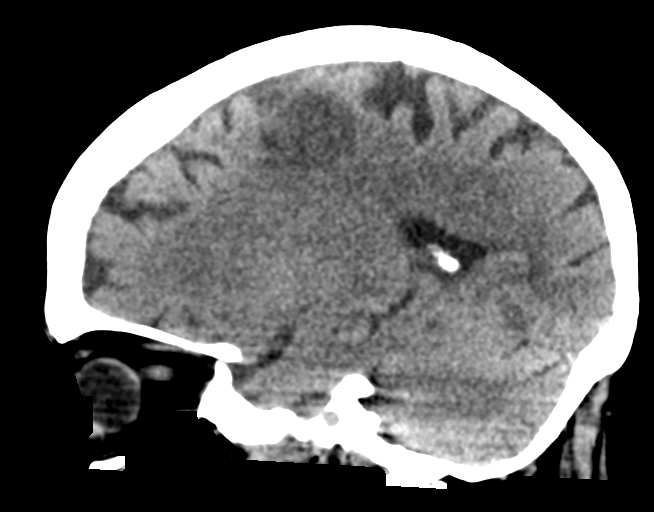
[im 24/48  brain]
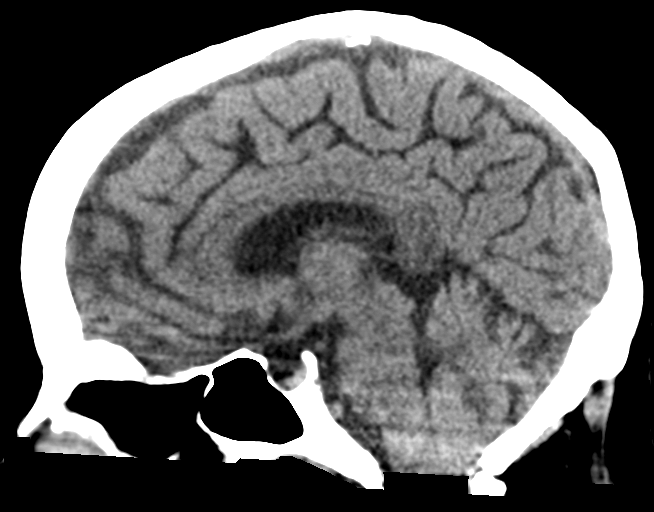
[im 32/48  brain]
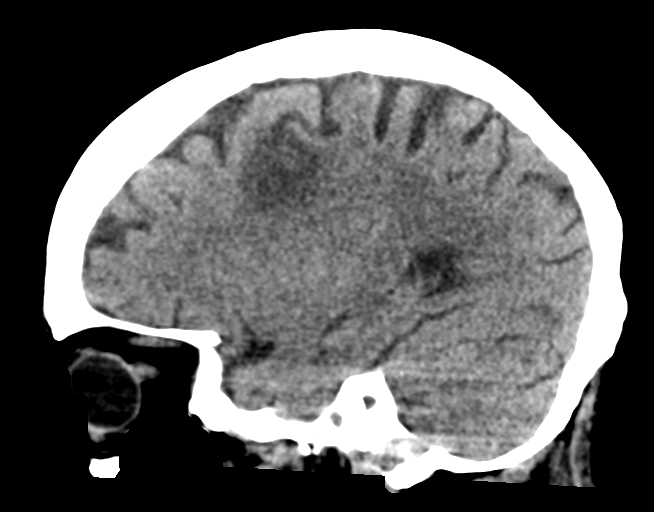

[16 of 46 positions shown; findings below may reference images not displayed]

FINDINGS: Brain: Areas of low-density noted in both frontal lobes concerning
for acute to subacute infarcts. No hemorrhage or hydrocephalus.

Vascular: No hyperdense vessel or unexpected calcification.

Skull: No acute calvarial abnormality.

Sinuses/Orbits: No acute findings

Other: None
IMPRESSION: Areas of low-density in both frontal lobes concerning for acute to
subacute infarcts.

## 2020-07-16 IMAGING — CT CT CHEST-ABD-PELV W/O CM
2 of 4 series · 13 of 36 positions shown, 18 images · non-contrast
Comparison: None.

CLINICAL DATA: Shortness of breath.

EXAM:
CT CHEST, ABDOMEN AND PELVIS WITHOUT CONTRAST
TECHNIQUE: Multidetector CT imaging of the chest, abdomen and pelvis was
performed following the standard protocol without IV contrast.

[Series 2: cap wo st · axial · 0.91mm/px · z∈[-733,-218]mm · 10 of 123 slices shown, 15 images]
[im 10/123  mediastinal]
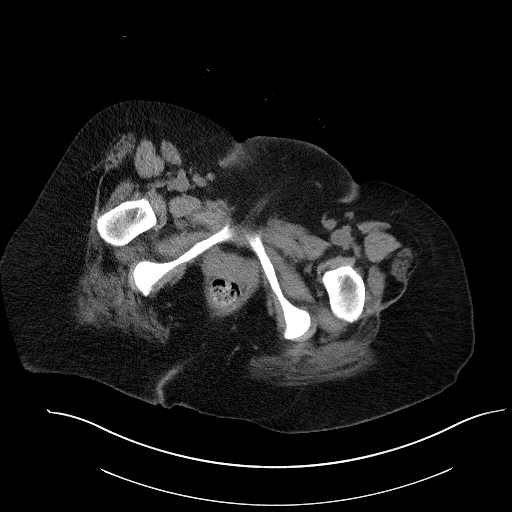
[im 10/123  bone]
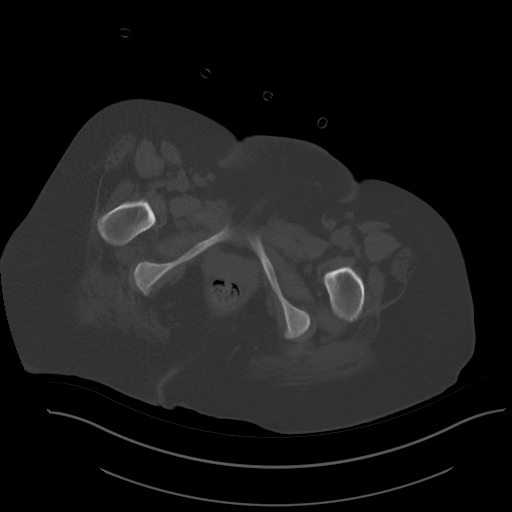
[im 29/123  mediastinal]
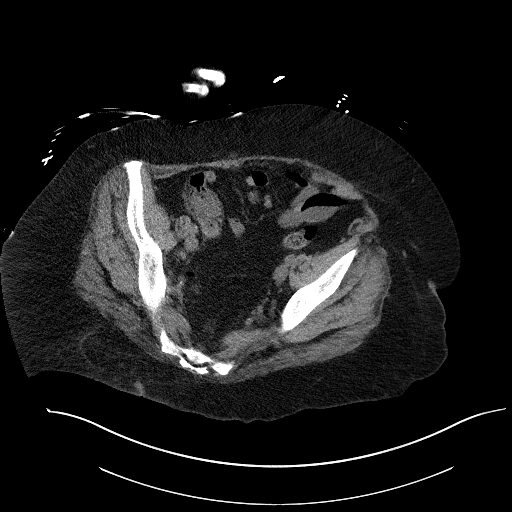
[im 38/123  mediastinal]
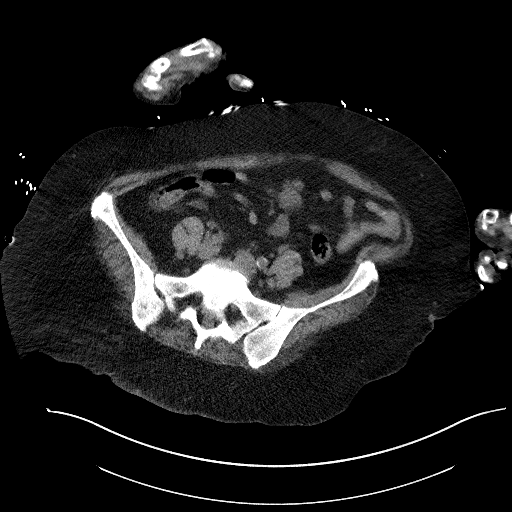
[im 47/123  mediastinal]
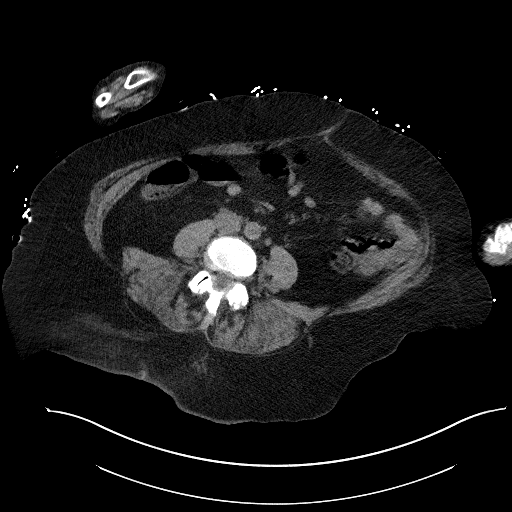
[im 66/123  mediastinal]
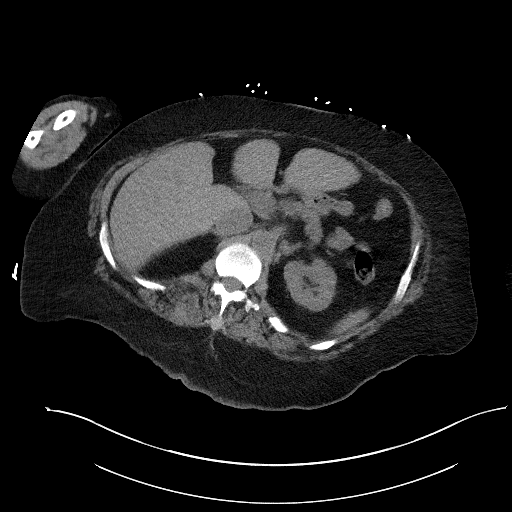
[im 76/123  mediastinal]
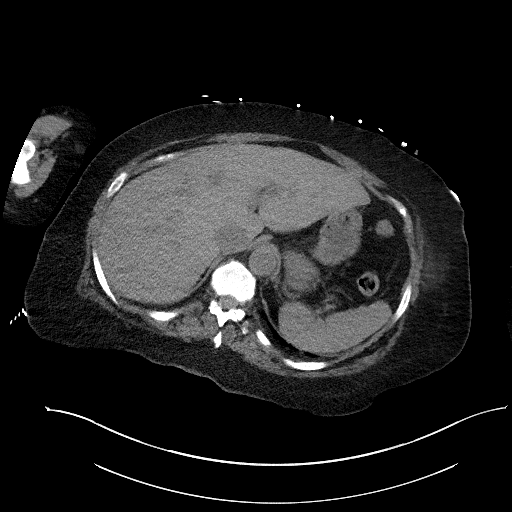
[im 85/123  mediastinal]
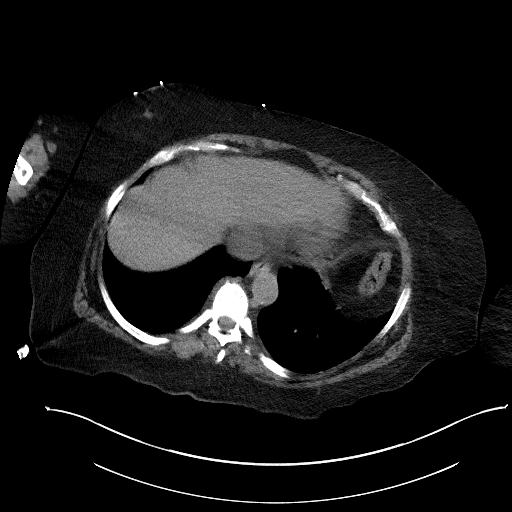
[im 85/123  lung]
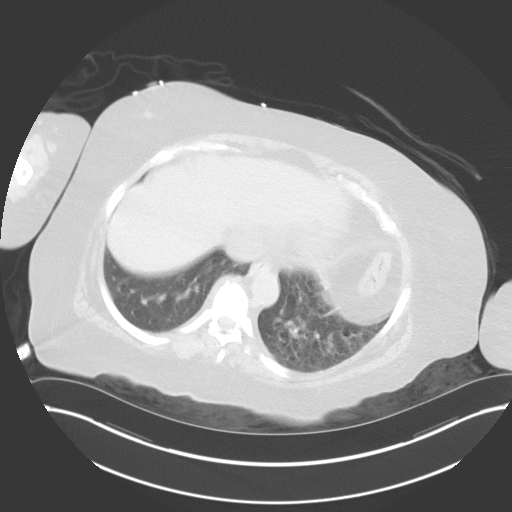
[im 94/123  lung]
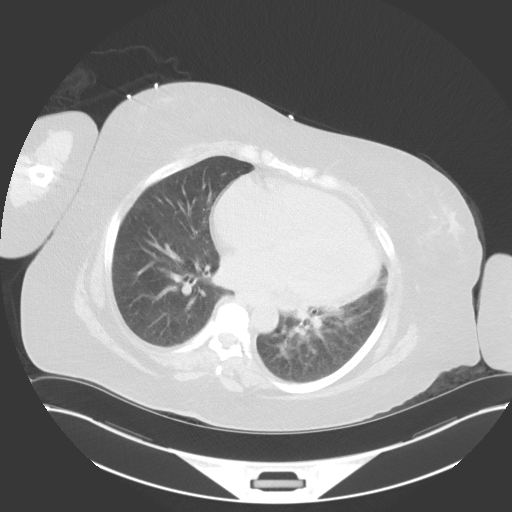
[im 104/123  mediastinal]
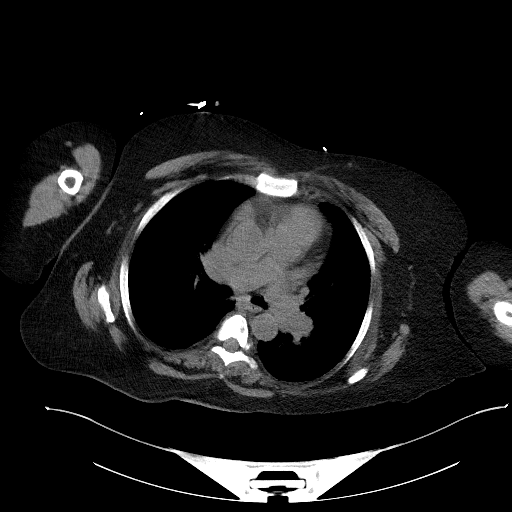
[im 104/123  lung]
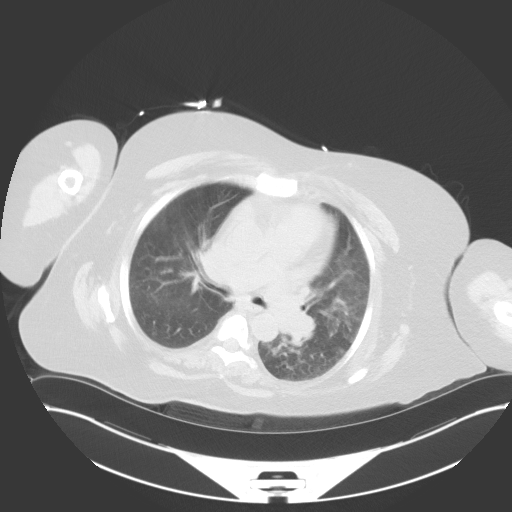
[im 113/123  mediastinal]
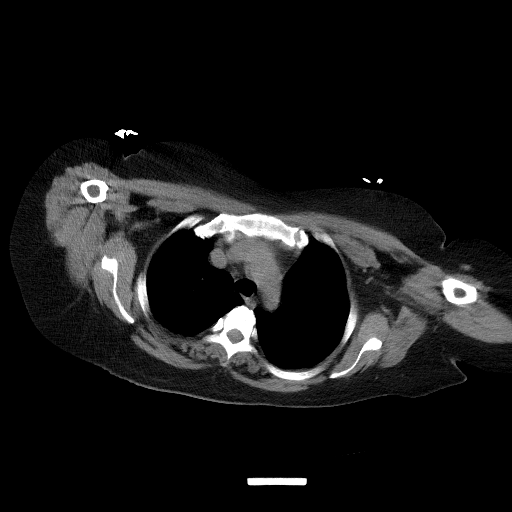
[im 113/123  lung]
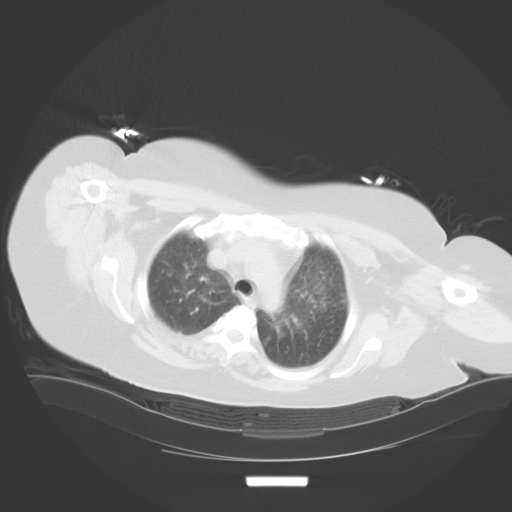
[im 113/123  bone]
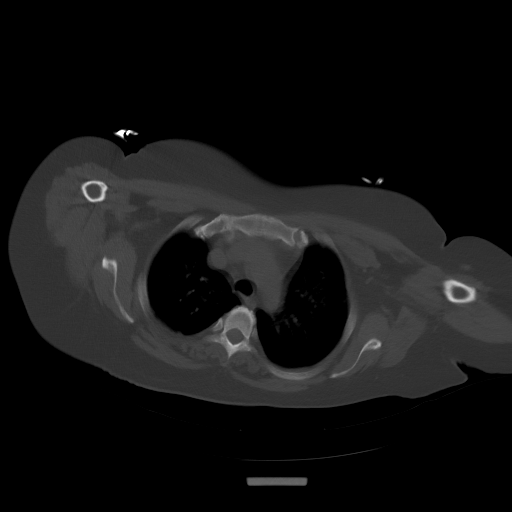

[Series 5: coronal · coronal · 0.84mm/px · 3 of 151 slices shown]
[im 31/151  mediastinal]
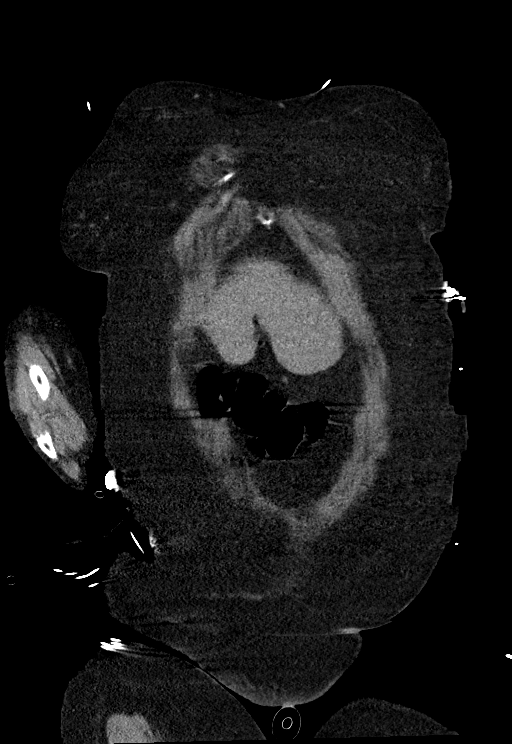
[im 61/151  mediastinal]
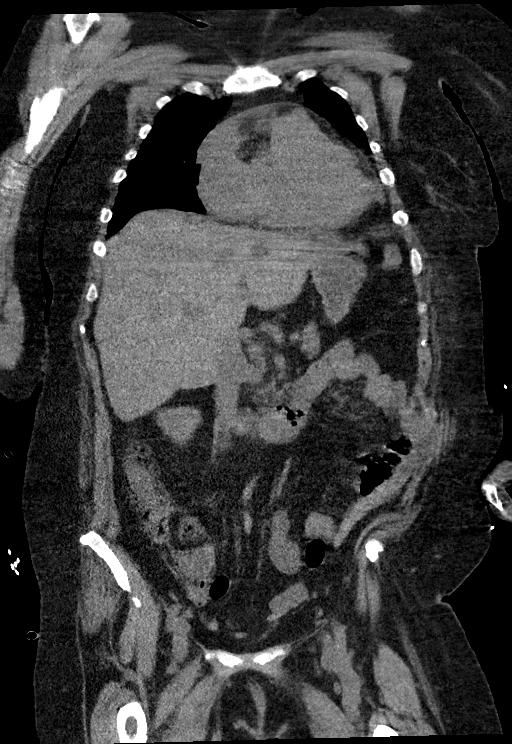
[im 91/151  mediastinal]
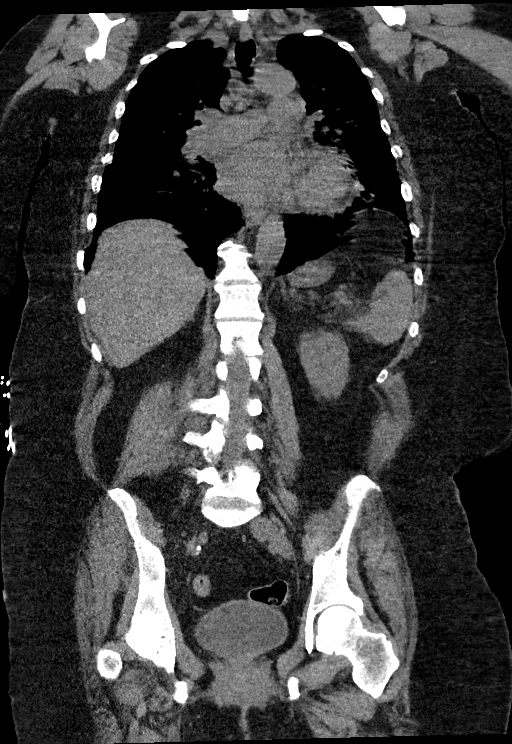

[13 of 36 positions shown; findings below may reference images not displayed]

FINDINGS: CT CHEST FINDINGS

Cardiovascular: Heart is mildly enlarged.  Aorta normal caliber.

Mediastinum/Nodes: No mediastinal, hilar, or axillary adenopathy.
Trachea and esophagus are unremarkable. Thyroid unremarkable.

Lungs/Pleura: Ground-glass airspace opacities within the upper lobes
bilaterally, left greater than right as well as left lower lobe. No
effusions or pneumothorax.

Musculoskeletal: Chest wall soft tissues are unremarkable. No acute
bony abnormality.

CT ABDOMEN PELVIS FINDINGS

Hepatobiliary: No focal liver abnormality is seen. Status post
cholecystectomy. No biliary dilatation.

Pancreas: No focal abnormality or ductal dilatation.

Spleen: No focal abnormality.  Normal size.

Adrenals/Urinary Tract: Fullness of the left adrenal gland
compatible with hyperplasia. Right adrenal gland and kidneys
unremarkable. No stones or hydronephrosis.

Stomach/Bowel: Stomach, large and small bowel grossly unremarkable.

Vascular/Lymphatic: Aortic atherosclerosis. No evidence of aneurysm
or adenopathy.

Reproductive: Prior hysterectomy.  No adnexal masses.

Other: No free fluid or free air.

Musculoskeletal: No acute bony abnormality.
IMPRESSION: Cardiomegaly.

Mild ground-glass opacities in the lungs, left greater than right.
This could reflect asymmetric edema or infection.

No acute findings in the abdomen or pelvis.

Aortic atherosclerosis.

## 2020-07-16 MED ORDER — SODIUM CHLORIDE 0.9 % IV BOLUS
500.0000 mL | Freq: Once | INTRAVENOUS | Status: AC
  Administered 2020-07-16: 500 mL via INTRAVENOUS

## 2020-07-16 MED ORDER — VANCOMYCIN HCL 1500 MG/300ML IV SOLN
1500.0000 mg | Freq: Once | INTRAVENOUS | Status: AC
  Administered 2020-07-17: 1500 mg via INTRAVENOUS
  Filled 2020-07-16: qty 300

## 2020-07-16 MED ORDER — VANCOMYCIN HCL IN DEXTROSE 1-5 GM/200ML-% IV SOLN
1000.0000 mg | Freq: Once | INTRAVENOUS | Status: DC
  Administered 2020-07-17: 1000 mg via INTRAVENOUS

## 2020-07-16 MED ORDER — VANCOMYCIN HCL IN DEXTROSE 1-5 GM/200ML-% IV SOLN
1000.0000 mg | Freq: Once | INTRAVENOUS | Status: AC
  Administered 2020-07-16: 1000 mg via INTRAVENOUS
  Filled 2020-07-16: qty 200

## 2020-07-16 MED ORDER — SODIUM CHLORIDE 0.9 % IV SOLN
2.0000 g | Freq: Once | INTRAVENOUS | Status: AC
  Administered 2020-07-16: 2 g via INTRAVENOUS
  Filled 2020-07-16: qty 2

## 2020-07-16 NOTE — Progress Notes (Signed)
PHARMACY -  BRIEF ANTIBIOTIC NOTE   Pharmacy has received consult(s) for Cefepime and Vancomycin from an ED provider.  The patient's profile has been reviewed for ht/wt/allergies/indication/available labs.    One time order(s) placed for Cefepime 2 gm and Vancomycin 2500 mg per pt wt: 105 kg  Further antibiotics/pharmacy consults should be ordered by admitting physician if indicated.                       Otelia Sergeant, PharmD, [facility] 07/16/2020 10:50 PM

## 2020-07-16 NOTE — ED Triage Notes (Signed)
Pt is from home where ems states they  Were called for sob , pt was reportedly found with a sp02 og 65% and ALOC. . Pt has been bed confined for 2 wks

## 2020-07-16 NOTE — ED Notes (Signed)
Brandi Holland ( son)   [phone]

## 2020-07-16 NOTE — Progress Notes (Signed)
CODE SEPSIS - PHARMACY COMMUNICATION  **Broad Spectrum Antibiotics should be administered within 1 hour of Sepsis diagnosis**  Time Code Sepsis Called/Page Received: 2233  Antibiotics Ordered: Cefepime and Vancomycin  Time of 1st antibiotic administration: 2315  Otelia Sergeant, PharmD, [facility] 07/16/2020 10:46 PM

## 2020-07-16 NOTE — ED Provider Notes (Signed)
[facility] Emergency Department Provider Note  ____________________________________________   Event Date/Time   First MD Initiated Contact with Patient 07/16/20 2001     (approximate)  I have reviewed the triage vital signs and the nursing notes.   HISTORY  Chief Complaint Shortness of Breath    HPI Brandi Holland is a 55 y.o. female with asthma, OSA, fibromyalgia who comes in with weakness, low oxygen levels and altered mental status.  According to EMS patient's been sitting in her bed for the past 2 weeks change smoking but has been acting more confused and EMS was called out found her to be satting in the 60s with reported wheezing so patient was given Solu-Medrol and 2 DuoNeb's.  Patient is currently on 3 L with normal oxygen levels.  However when I asked patient questions she is definitely confused.  Unable to get full HPI to due to patient's confusion    Medical: Asthma, OSA   Allergies Biaxin [clarithromycin] and Sulfa antibiotics  No family history on file.  Social History Positive smoking.  Unknown alcohol or drug   Review of Systems Unable to get full HPI due to patient's altered mental status ____________________________________________   PHYSICAL EXAM:  VITAL SIGNS: Blood pressure 105/64, pulse 84, temperature (!) 97.5 F (36.4 C), resp. rate 20, height 4\' 9"  (1.448 m), weight 105 kg, SpO2 94 %.   Constitutional: Sitting up in bed, answering questions but often times confused Eyes: Conjunctivae are normal. EOMI. Head: Atraumatic. Nose: No congestion/rhinnorhea. Mouth/Throat: Mucous membranes are moist.   Neck: No stridor. Trachea Midline. FROM Cardiovascular: Normal rate, regular rhythm. Grossly normal heart sounds.  Good peripheral circulation. Respiratory: On 3 L, mild increased work of breathing, no wheezing at this time Gastrointestinal: Soft and nontender. No distention. No abdominal bruits.  Musculoskeletal: No  lower extremity tenderness nor edema.  No joint effusions. Neurologic:  Normal speech and language. No gross focal neurologic deficits are appreciated.  Able to weakly squeeze her bilateral hands and move her bilateral feet Skin:  Skin is warm, dry and intact. No rash noted. Psychiatric: Mood and affect are normal. Speech and behavior are normal. GU: Deferred   ____________________________________________   LABS (all labs ordered are listed, but only abnormal results are displayed)  Labs Reviewed  LACTIC ACID, PLASMA - Abnormal; Notable for the following components:      Result Value   Lactic Acid, Venous 3.2 (*)    All other components within normal limits  COMPREHENSIVE METABOLIC PANEL - Abnormal; Notable for the following components:   Sodium 130 (*)    Chloride 93 (*)    Glucose, Bld 123 (*)    BUN 70 (*)    Creatinine, Ser 3.15 (*)    Calcium 8.8 (*)    Albumin 2.8 (*)    Total Bilirubin 2.6 (*)    GFR, Estimated 17 (*)    All other components within normal limits  D-DIMER, QUANTITATIVE - Abnormal; Notable for the following components:   D-Dimer, Quant 2.60 (*)    All other components within normal limits  BLOOD GAS, VENOUS - Abnormal; Notable for the following components:   pCO2, Ven 38 (*)    All other components within normal limits  CK - Abnormal; Notable for the following components:   Total CK 266 (*)    All other components within normal limits  TROPONIN I (HIGH SENSITIVITY) - Abnormal; Notable for the following components:   Troponin I (High Sensitivity) 708 (*)  All other components within normal limits  RESP PANEL BY RT-PCR (FLU A&B, COVID) ARPGX2  CULTURE, BLOOD (SINGLE)  CULTURE, BLOOD (SINGLE)  LIPASE, BLOOD  PROCALCITONIN  ETHANOL  LACTIC ACID, PLASMA  CBC WITH DIFFERENTIAL/PLATELET  PROCALCITONIN  URINE DRUG SCREEN, QUALITATIVE ([facility] ONLY)  URINALYSIS, COMPLETE (UACMP) WITH MICROSCOPIC  BRAIN NATRIURETIC PEPTIDE  BRAIN NATRIURETIC PEPTIDE  CBC   PROTIME-INR  APTT  POC URINE PREG, ED  TYPE AND SCREEN  TROPONIN I (HIGH SENSITIVITY)   ____________________________________________   ED ECG REPORT I, Concha Se, the attending physician, personally viewed and interpreted this ECG.  Normal sinus rate of 86, no st elevation, no twi, normal intervals.  ____________________________________________  RADIOLOGY Vela Prose, personally viewed and evaluated these images (plain radiographs) as part of my medical decision making, as well as reviewing the written report by the radiologist.  ED MD interpretation:  Some patchiness bilaterally on chest xray   Official radiology report(s): CT Head Wo Contrast  Result Date: 07/16/2020 CLINICAL DATA:  Dizziness EXAM: CT HEAD WITHOUT CONTRAST TECHNIQUE: Contiguous axial images were obtained from the base of the skull through the vertex without intravenous contrast. COMPARISON:  None. FINDINGS: Brain: Areas of low-density noted in both frontal lobes concerning for acute to subacute infarcts. No hemorrhage or hydrocephalus. Vascular: No hyperdense vessel or unexpected calcification. Skull: No acute calvarial abnormality. Sinuses/Orbits: No acute findings Other: None IMPRESSION: Areas of low-density in both frontal lobes concerning for acute to subacute infarcts. Electronically Signed   By: Charlett Nose M.D.   On: 07/16/2020 22:08   DG Chest Portable 1 View  Result Date: 07/16/2020 CLINICAL DATA:  Shortness of breath EXAM: PORTABLE CHEST 1 VIEW COMPARISON:  None. FINDINGS: Low lung volumes. Mild cardiomegaly. Diffuse interstitial prominence throughout the lungs. No effusions or pneumothorax. No acute bony abnormality. IMPRESSION: Cardiomegaly. Diffuse interstitial prominence could reflect interstitial edema or less likely atypical infection. Electronically Signed   By: Charlett Nose M.D.   On: 07/16/2020 20:41   CT CHEST ABDOMEN PELVIS WO CONTRAST  Result Date: 07/16/2020 CLINICAL DATA:  Shortness of  breath. EXAM: CT CHEST, ABDOMEN AND PELVIS WITHOUT CONTRAST TECHNIQUE: Multidetector CT imaging of the chest, abdomen and pelvis was performed following the standard protocol without IV contrast. COMPARISON:  None. FINDINGS: CT CHEST FINDINGS Cardiovascular: Heart is mildly enlarged.  Aorta normal caliber. Mediastinum/Nodes: No mediastinal, hilar, or axillary adenopathy. Trachea and esophagus are unremarkable. Thyroid unremarkable. Lungs/Pleura: Ground-glass airspace opacities within the upper lobes bilaterally, left greater than right as well as left lower lobe. No effusions or pneumothorax. Musculoskeletal: Chest wall soft tissues are unremarkable. No acute bony abnormality. CT ABDOMEN PELVIS FINDINGS Hepatobiliary: No focal liver abnormality is seen. Status post cholecystectomy. No biliary dilatation. Pancreas: No focal abnormality or ductal dilatation. Spleen: No focal abnormality.  Normal size. Adrenals/Urinary Tract: Fullness of the left adrenal gland compatible with hyperplasia. Right adrenal gland and kidneys unremarkable. No stones or hydronephrosis. Stomach/Bowel: Stomach, large and small bowel grossly unremarkable. Vascular/Lymphatic: Aortic atherosclerosis. No evidence of aneurysm or adenopathy. Reproductive: Prior hysterectomy.  No adnexal masses. Other: No free fluid or free air. Musculoskeletal: No acute bony abnormality. IMPRESSION: Cardiomegaly. Mild ground-glass opacities in the lungs, left greater than right. This could reflect asymmetric edema or infection. No acute findings in the abdomen or pelvis. Aortic atherosclerosis. Electronically Signed   By: Charlett Nose M.D.   On: 07/16/2020 22:13    ____________________________________________   PROCEDURES  Procedure(s) performed (including Critical Care):  .1-3 Lead EKG  Interpretation Performed by: Concha SeFunke, Shameer Molstad E, MD Authorized by: Concha SeFunke, Alicianna Litchford E, MD     Interpretation: normal     ECG rate:  80s    ECG rate assessment: normal      Rhythm: sinus rhythm     Ectopy: none     Conduction: normal   .Critical Care Performed by: Concha SeFunke, Smita Lesh E, MD Authorized by: Concha SeFunke, Chia Rock E, MD   Critical care provider statement:    Critical care time (minutes):  45   Critical care was necessary to treat or prevent imminent or life-threatening deterioration of the following conditions:  Sepsis and respiratory failure   Critical care was time spent personally by me on the following activities:  Discussions with consultants, evaluation of patient's response to treatment, examination of patient, ordering and performing treatments and interventions, ordering and review of laboratory studies, ordering and review of radiographic studies, pulse oximetry, re-evaluation of patient's condition, obtaining history from patient or surrogate and review of old charts     ____________________________________________   INITIAL IMPRESSION / ASSESSMENT AND PLAN / ED COURSE   Brandi Holland was evaluated in Emergency Department on 07/16/2020 for the symptoms described in the history of present illness. She was evaluated in the context of the global COVID-19 pandemic, which necessitated consideration that the patient might be at risk for infection with the SARS-CoV-2 virus that causes COVID-19. Institutional protocols and algorithms that pertain to the evaluation of patients at risk for COVID-19 are in a state of rapid change based on information released by regulatory bodies including the CDC and federal and state organizations. These policies and algorithms were followed during the patient's care in the ED.    Patient comes in with shortness of breath and altered mental status.  Labs were ordered to evaluate for ACS, PE, pneumonia.  Patient does not look fluid overloaded on exam although she had recent ER visit for concerns for edema.  She is got no swelling noted in her legs.  Patient is confused although has a nonfocal neuro exam and unclear last known normal  but will get CT head to evaluate for intercranial hemorrhage.  Chest x-ray shows concern for possible edema versus atypical infection.  BNP hemolyzed but again she does not look fluid overloaded so we trialed 500 cc of fluid patient's oxygen levels did not get any worse.  Her lactate and kidney function came back significantly elevated so I suspect that she is dehydrated given she tolerated the 500 cc fluid bolus we will give additional 1 L of fluid.  I added on CT imaging to evaluate for any other causes of patient's altered mental status.  Her CT head was concerning for stroke, CT chest does have some mild groundglass opacities consider infection versus edema.  Given her procalcitonin is elevated I will treat this as an infection started on broad-spectrum antibiotics.  I suspect that patient could have aspirated from her altered mental status from a stroke.  Her troponin is elevated but I do not want to start heparin due to the risk of hemorrhagic conversion if she did have a new large stroke.  Patient does not meet criteria for stroke code given unclear last known normal and after talking to the son it sounds like she has been abnormal for over a week   10:44 PM discussed with the son- just got here from [place]. She was not making sense on the phone- confusion has been going on 1 week. Noted some SOB and was limp  and not moving. Not bathing herself. Recent admission for fluid overload but not taking meds.  No prior procedures with plates or contraindication for MRI per son  Will discuss possible team for admission due to the above concerns     ____________________________________________   FINAL CLINICAL IMPRESSION(S) / ED DIAGNOSES   Final diagnoses:  Sepsis, due to unspecified organism, unspecified whether acute organ dysfunction present (HCC)  Acute respiratory failure with hypoxia (HCC)     MEDICATIONS GIVEN DURING THIS VISIT:  Medications  vancomycin (VANCOCIN) IVPB 1000 mg/200 mL  premix (1,000 mg Intravenous New Bag/Given 07/16/20 2315)    Followed by  vancomycin (VANCOREADY) IVPB 1500 mg/300 mL (has no administration in time range)  sodium chloride 0.9 % bolus 1,000 mL (has no administration in time range)  sodium chloride 0.9 % bolus 500 mL (500 mLs Intravenous New Bag/Given 07/16/20 2225)  ceFEPIme (MAXIPIME) 2 g in sodium chloride 0.9 % 100 mL IVPB (2 g Intravenous New Bag/Given 07/16/20 2315)     ED Discharge Orders    None       Note:  This document was prepared using Dragon voice recognition software and may include unintentional dictation errors.   Concha Se, MD 07/17/20 Perlie Mayo

## 2020-07-17 ENCOUNTER — Inpatient Hospital Stay: Payer: Medicaid Other

## 2020-07-17 ENCOUNTER — Inpatient Hospital Stay (HOSPITAL_COMMUNITY)
Admit: 2020-07-17 | Discharge: 2020-07-17 | Disposition: A | Discharge status: Home / Self Care | Payer: Medicaid Other | Attending: Internal Medicine | Admitting: Internal Medicine

## 2020-07-17 ENCOUNTER — Other Ambulatory Visit: Payer: Medicaid Other

## 2020-07-17 ENCOUNTER — Encounter: Payer: Self-pay | Admitting: Internal Medicine

## 2020-07-17 ENCOUNTER — Emergency Department: Payer: Medicaid Other

## 2020-07-17 DIAGNOSIS — G9340 Encephalopathy, unspecified: Secondary | ICD-10-CM | POA: Diagnosis not present

## 2020-07-17 DIAGNOSIS — E538 Deficiency of other specified B group vitamins: Secondary | ICD-10-CM | POA: Diagnosis not present

## 2020-07-17 DIAGNOSIS — E871 Hypo-osmolality and hyponatremia: Secondary | ICD-10-CM

## 2020-07-17 DIAGNOSIS — I639 Cerebral infarction, unspecified: Secondary | ICD-10-CM | POA: Diagnosis not present

## 2020-07-17 DIAGNOSIS — Z20822 Contact with and (suspected) exposure to covid-19: Secondary | ICD-10-CM | POA: Diagnosis present

## 2020-07-17 DIAGNOSIS — W06XXXA Fall from bed, initial encounter: Secondary | ICD-10-CM | POA: Diagnosis present

## 2020-07-17 DIAGNOSIS — F05 Delirium due to known physiological condition: Secondary | ICD-10-CM | POA: Diagnosis present

## 2020-07-17 DIAGNOSIS — R4182 Altered mental status, unspecified: Secondary | ICD-10-CM | POA: Diagnosis present

## 2020-07-17 DIAGNOSIS — J441 Chronic obstructive pulmonary disease with (acute) exacerbation: Secondary | ICD-10-CM | POA: Diagnosis present

## 2020-07-17 DIAGNOSIS — I340 Nonrheumatic mitral (valve) insufficiency: Secondary | ICD-10-CM | POA: Diagnosis not present

## 2020-07-17 DIAGNOSIS — J9601 Acute respiratory failure with hypoxia: Secondary | ICD-10-CM

## 2020-07-17 DIAGNOSIS — D500 Iron deficiency anemia secondary to blood loss (chronic): Secondary | ICD-10-CM | POA: Diagnosis not present

## 2020-07-17 DIAGNOSIS — J45901 Unspecified asthma with (acute) exacerbation: Secondary | ICD-10-CM | POA: Diagnosis present

## 2020-07-17 DIAGNOSIS — Z8673 Personal history of transient ischemic attack (TIA), and cerebral infarction without residual deficits: Secondary | ICD-10-CM | POA: Diagnosis not present

## 2020-07-17 DIAGNOSIS — J45909 Unspecified asthma, uncomplicated: Secondary | ICD-10-CM | POA: Diagnosis not present

## 2020-07-17 DIAGNOSIS — R778 Other specified abnormalities of plasma proteins: Secondary | ICD-10-CM | POA: Diagnosis not present

## 2020-07-17 DIAGNOSIS — I130 Hypertensive heart and chronic kidney disease with heart failure and stage 1 through stage 4 chronic kidney disease, or unspecified chronic kidney disease: Secondary | ICD-10-CM | POA: Diagnosis present

## 2020-07-17 DIAGNOSIS — D649 Anemia, unspecified: Secondary | ICD-10-CM

## 2020-07-17 DIAGNOSIS — D696 Thrombocytopenia, unspecified: Secondary | ICD-10-CM | POA: Diagnosis present

## 2020-07-17 DIAGNOSIS — R7881 Bacteremia: Secondary | ICD-10-CM

## 2020-07-17 DIAGNOSIS — Z23 Encounter for immunization: Secondary | ICD-10-CM | POA: Diagnosis not present

## 2020-07-17 DIAGNOSIS — Z6841 Body Mass Index (BMI) 40.0 and over, adult: Secondary | ICD-10-CM | POA: Diagnosis not present

## 2020-07-17 DIAGNOSIS — I5033 Acute on chronic diastolic (congestive) heart failure: Secondary | ICD-10-CM | POA: Diagnosis present

## 2020-07-17 DIAGNOSIS — M797 Fibromyalgia: Secondary | ICD-10-CM | POA: Diagnosis present

## 2020-07-17 DIAGNOSIS — G4733 Obstructive sleep apnea (adult) (pediatric): Secondary | ICD-10-CM

## 2020-07-17 DIAGNOSIS — G928 Other toxic encephalopathy: Secondary | ICD-10-CM | POA: Diagnosis present

## 2020-07-17 DIAGNOSIS — J9621 Acute and chronic respiratory failure with hypoxia: Secondary | ICD-10-CM

## 2020-07-17 DIAGNOSIS — I6389 Other cerebral infarction: Secondary | ICD-10-CM | POA: Diagnosis not present

## 2020-07-17 DIAGNOSIS — N1831 Chronic kidney disease, stage 3a: Secondary | ICD-10-CM | POA: Diagnosis present

## 2020-07-17 DIAGNOSIS — D6489 Other specified anemias: Secondary | ICD-10-CM

## 2020-07-17 DIAGNOSIS — I480 Paroxysmal atrial fibrillation: Secondary | ICD-10-CM | POA: Diagnosis present

## 2020-07-17 DIAGNOSIS — R0602 Shortness of breath: Secondary | ICD-10-CM | POA: Diagnosis not present

## 2020-07-17 DIAGNOSIS — E874 Mixed disorder of acid-base balance: Secondary | ICD-10-CM | POA: Diagnosis present

## 2020-07-17 DIAGNOSIS — D589 Hereditary hemolytic anemia, unspecified: Secondary | ICD-10-CM | POA: Diagnosis present

## 2020-07-17 DIAGNOSIS — I351 Nonrheumatic aortic (valve) insufficiency: Secondary | ICD-10-CM | POA: Diagnosis present

## 2020-07-17 DIAGNOSIS — I749 Embolism and thrombosis of unspecified artery: Secondary | ICD-10-CM | POA: Diagnosis not present

## 2020-07-17 DIAGNOSIS — D693 Immune thrombocytopenic purpura: Secondary | ICD-10-CM | POA: Diagnosis present

## 2020-07-17 DIAGNOSIS — A419 Sepsis, unspecified organism: Secondary | ICD-10-CM | POA: Diagnosis not present

## 2020-07-17 DIAGNOSIS — G629 Polyneuropathy, unspecified: Secondary | ICD-10-CM | POA: Diagnosis present

## 2020-07-17 DIAGNOSIS — I6340 Cerebral infarction due to embolism of unspecified cerebral artery: Secondary | ICD-10-CM | POA: Diagnosis present

## 2020-07-17 DIAGNOSIS — N179 Acute kidney failure, unspecified: Secondary | ICD-10-CM

## 2020-07-17 DIAGNOSIS — N189 Chronic kidney disease, unspecified: Secondary | ICD-10-CM | POA: Diagnosis not present

## 2020-07-17 LAB — BLOOD CULTURE ID PANEL (REFLEXED) - BCID2

## 2020-07-17 LAB — URINE DRUG SCREEN, QUALITATIVE (ARMC ONLY)
Amphetamines, Ur Screen: NOT DETECTED
Barbiturates, Ur Screen: NOT DETECTED
Benzodiazepine, Ur Scrn: NOT DETECTED
Cannabinoid 50 Ng, Ur ~~LOC~~: NOT DETECTED
Cocaine Metabolite,Ur ~~LOC~~: NOT DETECTED
MDMA (Ecstasy)Ur Screen: NOT DETECTED
Methadone Scn, Ur: NOT DETECTED
Opiate, Ur Screen: NOT DETECTED
Phencyclidine (PCP) Ur S: NOT DETECTED
Tricyclic, Ur Screen: NOT DETECTED

## 2020-07-17 LAB — CBC
HCT: 16.2 % — ABNORMAL LOW (ref 36.0–46.0)
HCT: 18.3 % — ABNORMAL LOW (ref 36.0–46.0)
Hemoglobin: 5 g/dL — ABNORMAL LOW (ref 12.0–15.0)
Hemoglobin: 5.7 g/dL — ABNORMAL LOW (ref 12.0–15.0)
MCH: 30.5 pg (ref 26.0–34.0)
MCH: 30.5 pg (ref 26.0–34.0)
MCHC: 30.9 g/dL (ref 30.0–36.0)
MCHC: 31.1 g/dL (ref 30.0–36.0)
MCV: 98.8 fL (ref 80.0–100.0)
MCV: 97.9 fL (ref 80.0–100.0)
Platelets: 101 10*3/uL — ABNORMAL LOW (ref 150–400)
Platelets: 97 10*3/uL — ABNORMAL LOW (ref 150–400)
RBC: 1.64 MIL/uL — ABNORMAL LOW (ref 3.87–5.11)
RBC: 1.87 MIL/uL — ABNORMAL LOW (ref 3.87–5.11)
RDW: 22 % — ABNORMAL HIGH (ref 11.5–15.5)
RDW: 22.8 % — ABNORMAL HIGH (ref 11.5–15.5)
WBC: 6.8 10*3/uL (ref 4.0–10.5)
WBC: 6 10*3/uL (ref 4.0–10.5)
nRBC: 0.7 % — ABNORMAL HIGH (ref 0.0–0.2)
nRBC: 0.8 % — ABNORMAL HIGH (ref 0.0–0.2)

## 2020-07-17 LAB — PROTIME-INR
INR: 1.5 — ABNORMAL HIGH (ref 0.8–1.2)
INR: 1.5 — ABNORMAL HIGH (ref 0.8–1.2)
Prothrombin Time: 18.5 seconds — ABNORMAL HIGH (ref 11.4–15.2)
Prothrombin Time: 17.6 seconds — ABNORMAL HIGH (ref 11.4–15.2)

## 2020-07-17 LAB — CBC WITH DIFFERENTIAL/PLATELET
Abs Immature Granulocytes: 0.09 10*3/uL — ABNORMAL HIGH (ref 0.00–0.07)
Basophils Absolute: 0 10*3/uL (ref 0.0–0.1)
Basophils Relative: 0 %
Eosinophils Absolute: 0 10*3/uL (ref 0.0–0.5)
Eosinophils Relative: 0 %
HCT: 20.6 % — ABNORMAL LOW (ref 36.0–46.0)
Hemoglobin: 6.5 g/dL — ABNORMAL LOW (ref 12.0–15.0)
Immature Granulocytes: 1 %
Lymphocytes Relative: 21 %
Lymphs Abs: 1.3 10*3/uL (ref 0.7–4.0)
MCH: 30.5 pg (ref 26.0–34.0)
MCHC: 31.6 g/dL (ref 30.0–36.0)
MCV: 96.7 fL (ref 80.0–100.0)
Monocytes Absolute: 0.3 10*3/uL (ref 0.1–1.0)
Monocytes Relative: 5 %
Neutro Abs: 4.5 10*3/uL (ref 1.7–7.7)
Neutrophils Relative %: 73 %
Platelets: 80 10*3/uL — ABNORMAL LOW (ref 150–400)
RBC: 2.13 MIL/uL — ABNORMAL LOW (ref 3.87–5.11)
RDW: 22 % — ABNORMAL HIGH (ref 11.5–15.5)
Smear Review: NORMAL
WBC: 6.3 10*3/uL (ref 4.0–10.5)
nRBC: 1 % — ABNORMAL HIGH (ref 0.0–0.2)

## 2020-07-17 LAB — LIPID PANEL
Cholesterol: 76 mg/dL (ref 0–200)
HDL: 13 mg/dL — ABNORMAL LOW (ref >40)
Total CHOL/HDL Ratio: 5.8 RATIO
Triglycerides: 69 mg/dL (ref <150)
VLDL: 14 mg/dL (ref 0–40)

## 2020-07-17 LAB — OSMOLALITY, URINE: Osmolality, Ur: 439 mOsm/kg (ref 300–900)

## 2020-07-17 LAB — PREPARE RBC (CROSSMATCH)

## 2020-07-17 LAB — COMPREHENSIVE METABOLIC PANEL
ALT: 11 U/L (ref 0–44)
AST: 25 U/L (ref 15–41)
Albumin: 2.7 g/dL — ABNORMAL LOW (ref 3.5–5.0)
Alkaline Phosphatase: 57 U/L (ref 38–126)
Anion gap: 13 (ref 5–15)
BUN: 73 mg/dL — ABNORMAL HIGH (ref 6–20)
CO2: 21 mmol/L — ABNORMAL LOW (ref 22–32)
Calcium: 8.6 mg/dL — ABNORMAL LOW (ref 8.9–10.3)
Chloride: 96 mmol/L — ABNORMAL LOW (ref 98–111)
Creatinine, Ser: 2.99 mg/dL — ABNORMAL HIGH (ref 0.44–1.00)
GFR, Estimated: 18 mL/min — ABNORMAL LOW (ref >60)
Glucose, Bld: 148 mg/dL — ABNORMAL HIGH (ref 70–99)
Potassium: 4.5 mmol/L (ref 3.5–5.1)
Sodium: 130 mmol/L — ABNORMAL LOW (ref 135–145)
Total Bilirubin: 2.5 mg/dL — ABNORMAL HIGH (ref 0.3–1.2)
Total Protein: 6.7 g/dL (ref 6.5–8.1)

## 2020-07-17 LAB — ECHOCARDIOGRAM COMPLETE
AR max vel: 1.37 cm2
AV Area VTI: 1.59 cm2
AV Area mean vel: 1.2 cm2
AV Mean grad: 13 mmHg
AV Peak grad: 20.8 mmHg
Ao pk vel: 2.28 m/s
Area-P 1/2: 4.89 cm2
Height: 57 in
MV VTI: 2.1 cm2
S' Lateral: 3.4 cm
Weight: 3703.73 oz

## 2020-07-17 LAB — BLOOD GAS, ARTERIAL
Acid-base deficit: 2.8 mmol/L — ABNORMAL HIGH (ref 0.0–2.0)
Bicarbonate: 20.6 mmol/L (ref 20.0–28.0)
FIO2: 1
O2 Saturation: 99.8 %
Patient temperature: 37
pCO2 arterial: 29 mmHg — ABNORMAL LOW (ref 32.0–48.0)
pH, Arterial: 7.46 — ABNORMAL HIGH (ref 7.350–7.450)
pO2, Arterial: 204 mmHg — ABNORMAL HIGH (ref 83.0–108.0)

## 2020-07-17 LAB — TROPONIN I (HIGH SENSITIVITY)
Troponin I (High Sensitivity): 869 ng/L (ref <18)
Troponin I (High Sensitivity): 846 ng/L (ref <18)
Troponin I (High Sensitivity): 904 ng/L (ref <18)

## 2020-07-17 LAB — LACTIC ACID, PLASMA
Lactic Acid, Venous: 2.5 mmol/L (ref 0.5–1.9)
Lactic Acid, Venous: 1.9 mmol/L (ref 0.5–1.9)

## 2020-07-17 LAB — VITAMIN B12: Vitamin B-12: 659 pg/mL (ref 180–914)

## 2020-07-17 LAB — RETIC PANEL
Immature Retic Fract: 33.9 % — ABNORMAL HIGH (ref 2.3–15.9)
RBC.: 2.24 MIL/uL — ABNORMAL LOW (ref 3.87–5.11)
Retic Count, Absolute: 138.9 10*3/uL (ref 19.0–186.0)
Retic Ct Pct: 6.2 % — ABNORMAL HIGH (ref 0.4–3.1)
Reticulocyte Hemoglobin: 30.4 pg (ref >27.9)

## 2020-07-17 LAB — URINALYSIS, COMPLETE (UACMP) WITH MICROSCOPIC
Bacteria, UA: NONE SEEN
Bilirubin Urine: NEGATIVE
Glucose, UA: NEGATIVE mg/dL
Hgb urine dipstick: NEGATIVE
Ketones, ur: NEGATIVE mg/dL
Leukocytes,Ua: NEGATIVE
Nitrite: NEGATIVE
Protein, ur: NEGATIVE mg/dL
Specific Gravity, Urine: 1.023 (ref 1.005–1.030)
pH: 5 (ref 5.0–8.0)

## 2020-07-17 LAB — IRON AND TIBC
Iron: 24 ug/dL — ABNORMAL LOW (ref 28–170)
Saturation Ratios: 6 % — ABNORMAL LOW (ref 10.4–31.8)
TIBC: 389 ug/dL (ref 250–450)
UIBC: 365 ug/dL

## 2020-07-17 LAB — HEMOGLOBIN AND HEMATOCRIT, BLOOD
HCT: 20.9 % — ABNORMAL LOW (ref 36.0–46.0)
Hemoglobin: 6.6 g/dL — ABNORMAL LOW (ref 12.0–15.0)

## 2020-07-17 LAB — PROCALCITONIN: Procalcitonin: 0.34 ng/mL

## 2020-07-17 LAB — BILIRUBIN, DIRECT: Bilirubin, Direct: 0.8 mg/dL — ABNORMAL HIGH (ref 0.0–0.2)

## 2020-07-17 LAB — FERRITIN: Ferritin: 108 ng/mL (ref 11–307)

## 2020-07-17 LAB — APTT: aPTT: 31 seconds (ref 24–36)

## 2020-07-17 LAB — HIV ANTIBODY (ROUTINE TESTING W REFLEX): HIV Screen 4th Generation wRfx: NONREACTIVE

## 2020-07-17 LAB — ABO/RH: ABO/RH(D): O POS

## 2020-07-17 LAB — DAT, POLYSPECIFIC AHG (ARMC ONLY): Polyspecific AHG test: NEGATIVE

## 2020-07-17 LAB — LACTATE DEHYDROGENASE: LDH: 283 U/L — ABNORMAL HIGH (ref 98–192)

## 2020-07-17 LAB — BRAIN NATRIURETIC PEPTIDE
B Natriuretic Peptide: 1768.8 pg/mL — ABNORMAL HIGH (ref 0.0–100.0)
B Natriuretic Peptide: 1893.4 pg/mL — ABNORMAL HIGH (ref 0.0–100.0)

## 2020-07-17 LAB — OSMOLALITY: Osmolality: 300 mOsm/kg — ABNORMAL HIGH (ref 275–295)

## 2020-07-17 LAB — PATHOLOGIST SMEAR REVIEW

## 2020-07-17 LAB — TSH: TSH: 3.567 u[IU]/mL (ref 0.350–4.500)

## 2020-07-17 LAB — LDL CHOLESTEROL, DIRECT: Direct LDL: 46.4 mg/dL (ref 0–99)

## 2020-07-17 LAB — PREGNANCY, URINE: Preg Test, Ur: NEGATIVE

## 2020-07-17 LAB — IMMATURE PLATELET FRACTION: Immature Platelet Fraction: 15.8 % — ABNORMAL HIGH (ref 1.2–8.6)

## 2020-07-17 LAB — FOLATE: Folate: 4 ng/mL — ABNORMAL LOW (ref >5.9)

## 2020-07-17 LAB — SAVE SMEAR(SSMR), FOR PROVIDER SLIDE REVIEW

## 2020-07-17 IMAGING — US US ABDOMEN COMPLETE
1 series · 14 of 25 positions shown · non-contrast
Comparison: [DATE], [DATE]

CLINICAL DATA: Hyperbilirubinemia

EXAM:
ABDOMEN ULTRASOUND COMPLETE

[Series 1: us abdomen complete · 14 of 68 slices shown]
[im 1/68]
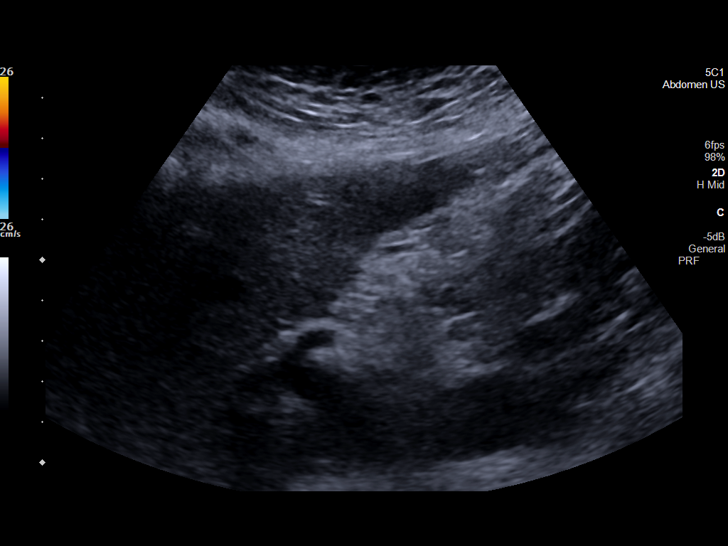
[im 6/68]
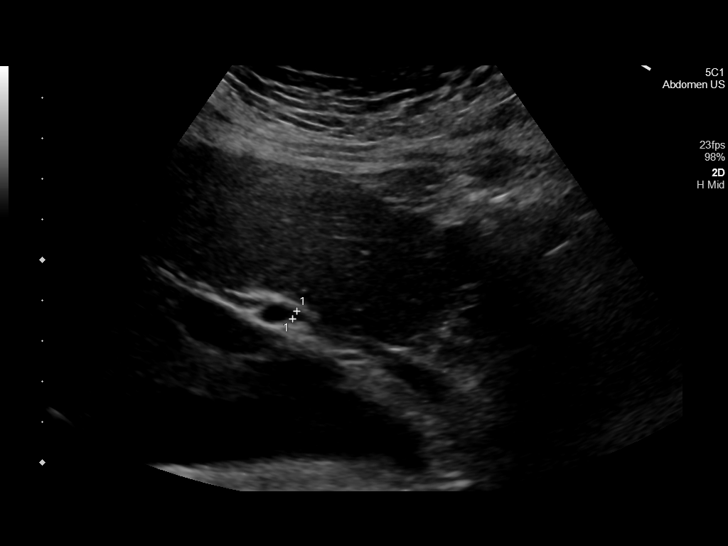
[im 12/68]
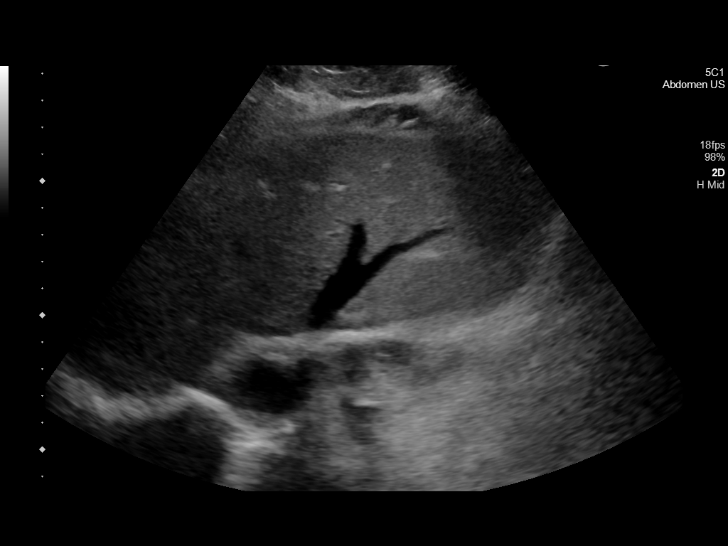
[im 17/68]
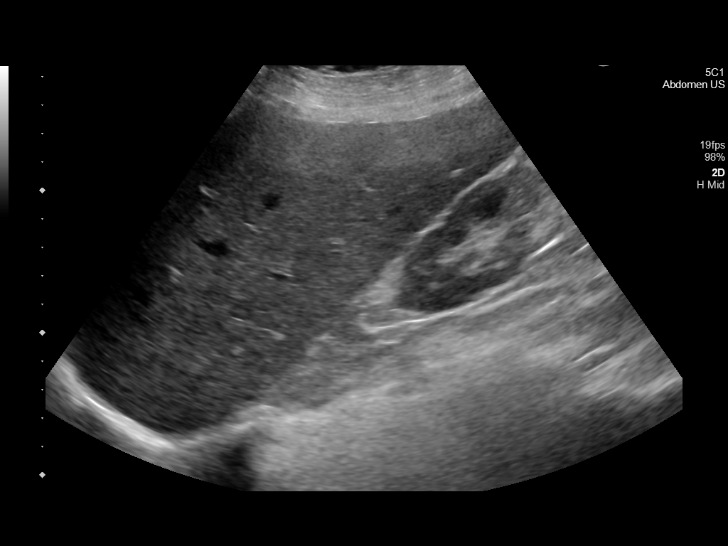
[im 23/68]
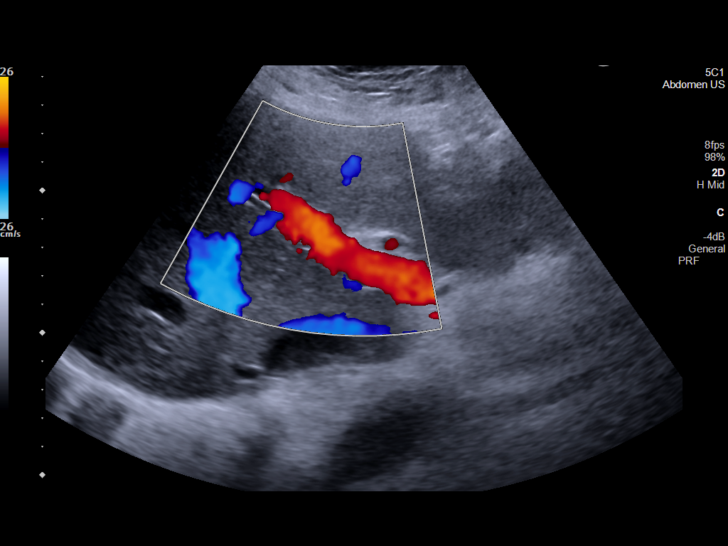
[im 26/68]
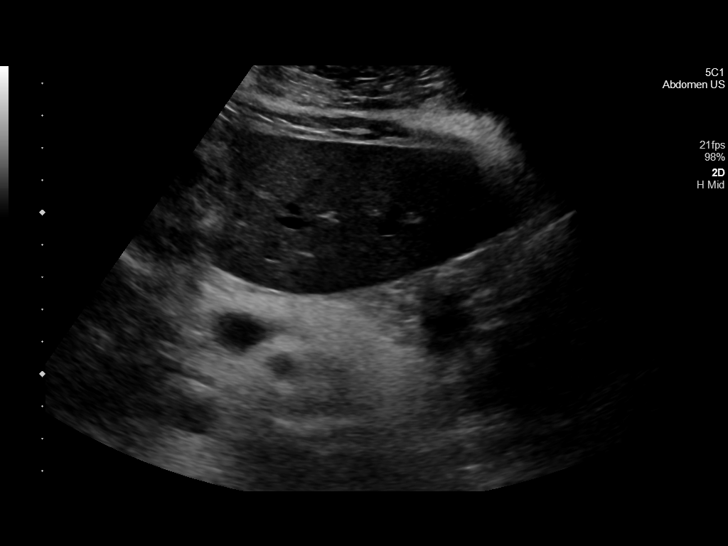
[im 31/68]
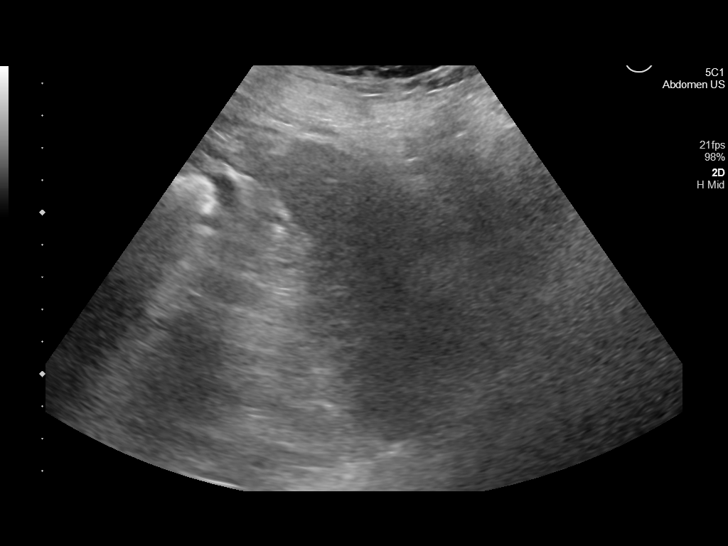
[im 37/68]
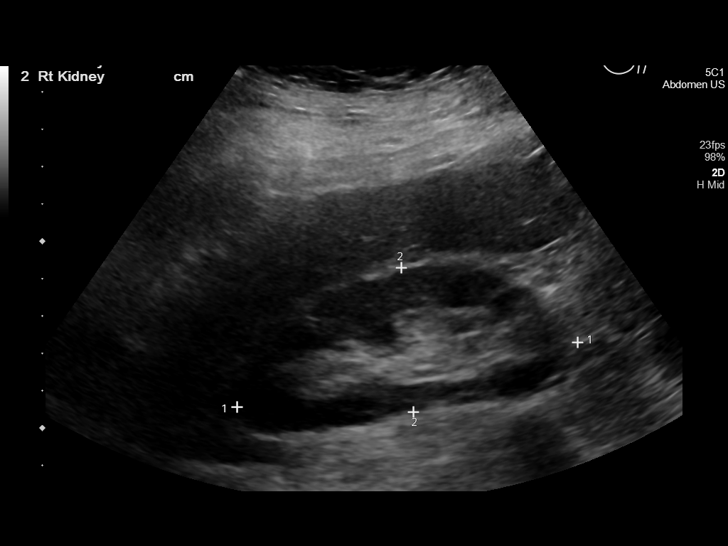
[im 42/68]
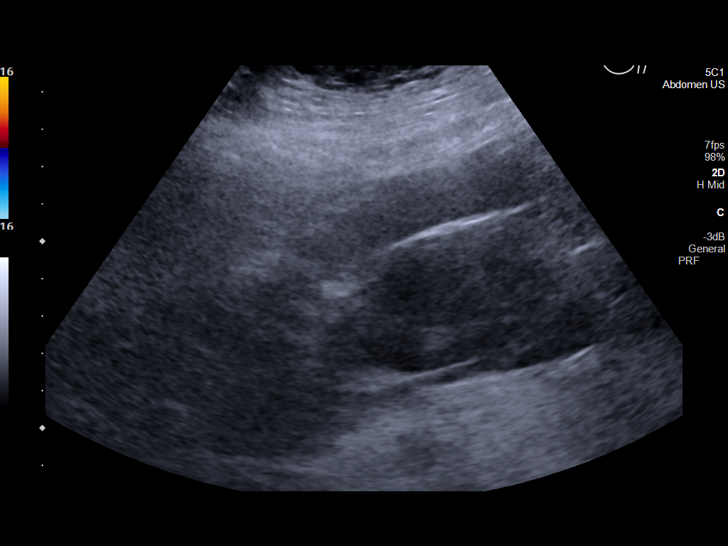
[im 45/68]
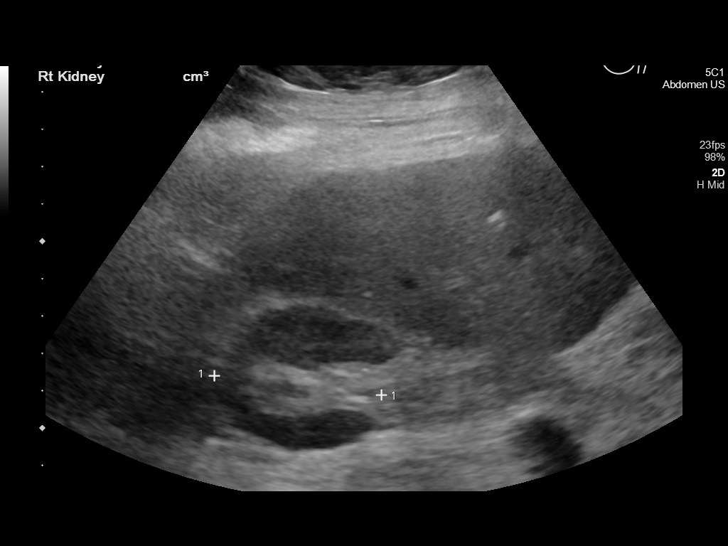
[im 51/68]
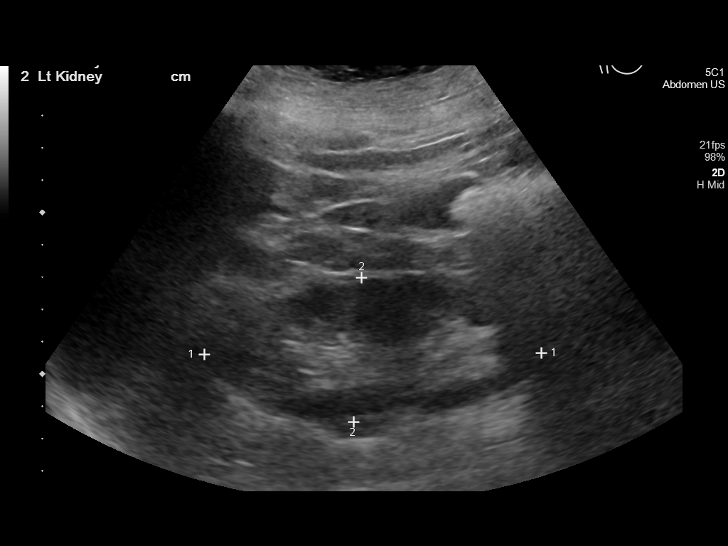
[im 56/68]
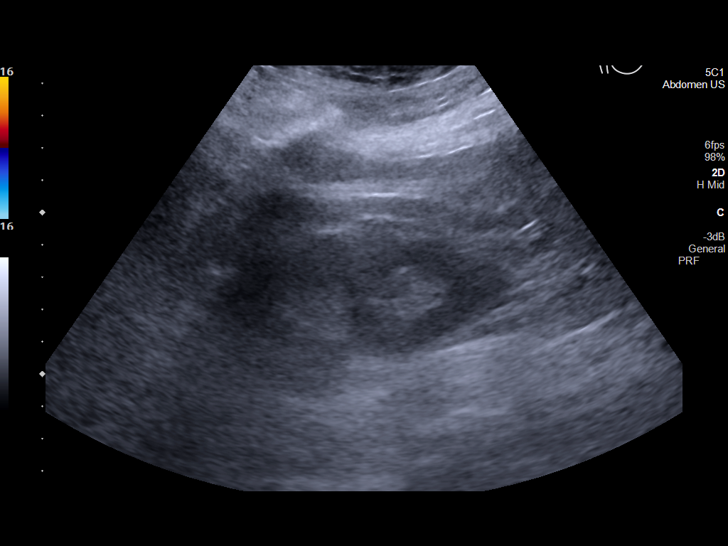
[im 62/68]
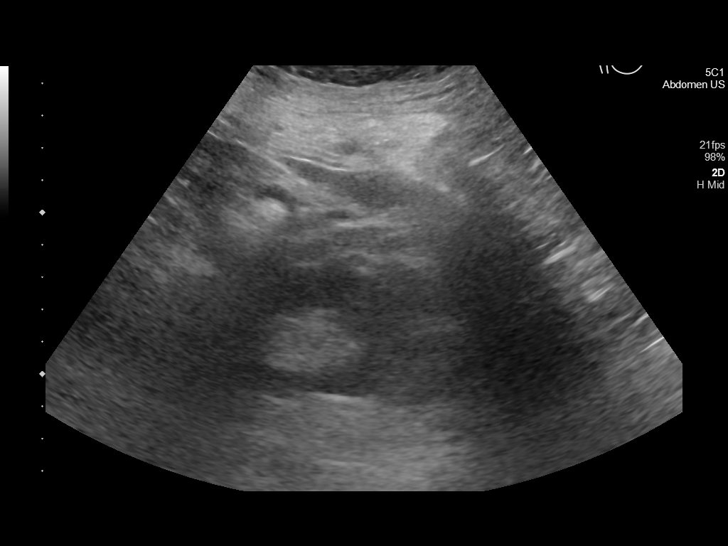
[im 68/68]
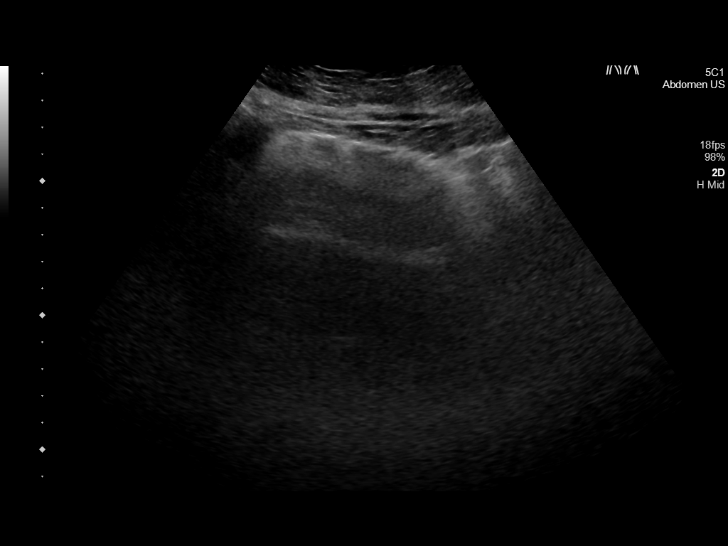

[14 of 25 positions shown; findings below may reference images not displayed]

FINDINGS: Gallbladder: Surgically absent

Common bile duct: Diameter: 2 mm

Liver: No focal lesion identified. Within normal limits in
parenchymal echogenicity. Portal vein is patent on color Doppler
imaging with normal direction of blood flow towards the liver.

IVC: No abnormality visualized.

Pancreas: Visualized portion unremarkable.

Spleen: Size and appearance within normal limits.

Right Kidney: Length: 9.3 cm. Echogenicity within normal limits. No
mass or hydronephrosis visualized.

Left Kidney: Length: 10.4 cm. Echogenicity within normal limits. No
mass or hydronephrosis visualized.

Abdominal aorta: Proximal aorta is unremarkable measuring up to
cm. Distal aorta and bifurcation obscured by bowel gas.

Other findings: None.
IMPRESSION: 1. Grossly unremarkable abdominal ultrasound in this patient status
post cholecystectomy.

## 2020-07-17 IMAGING — MR MR MRA HEAD W/O CM
1 series · 41 of 48 positions shown · non-contrast
Comparison: No pertinent prior exam.

CLINICAL DATA: Subarachnoid hemorrhage

EXAM:
MRA HEAD WITHOUT CONTRAST
TECHNIQUE: Angiographic images of the Circle of Willis were acquired using MRA
technique without intravenous contrast.

[Series 24: TOF · axial · 0.6mm · 0.52mm/px · z∈[-229,-154]mm · 41 of 133 slices shown]
[im 1/133]
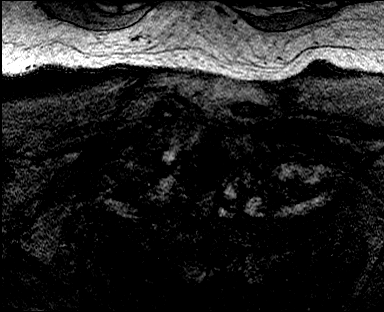
[im 3/133]
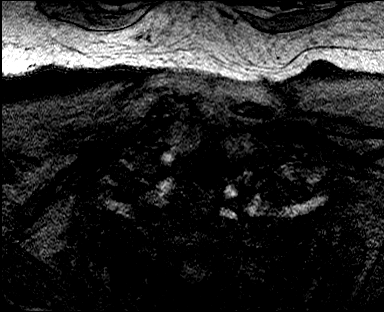
[im 6/133]
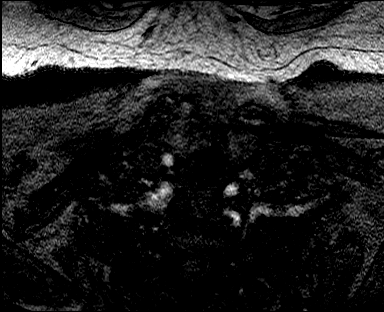
[im 9/133]
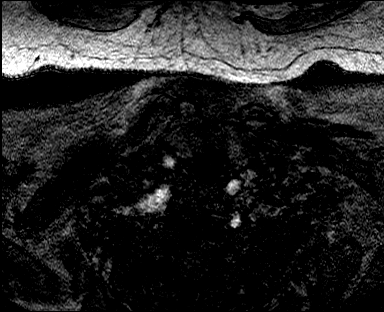
[im 12/133]
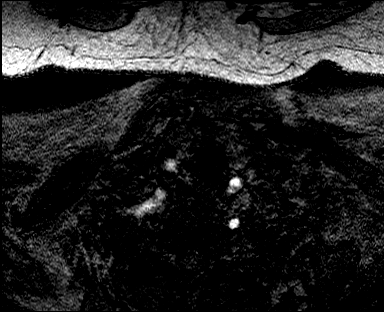
[im 15/133]
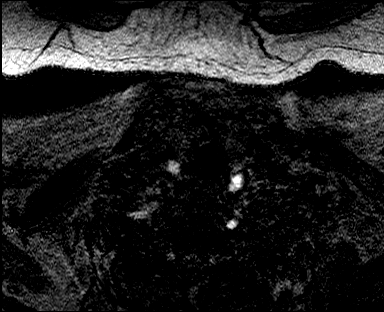
[im 17/133]
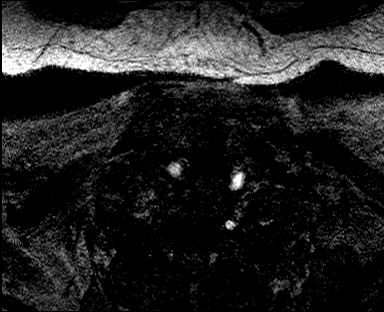
[im 20/133]
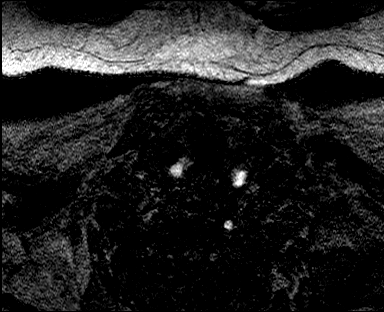
[im 23/133]
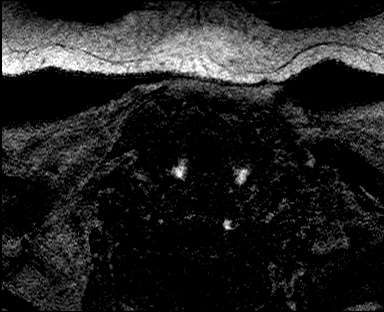
[im 26/133]
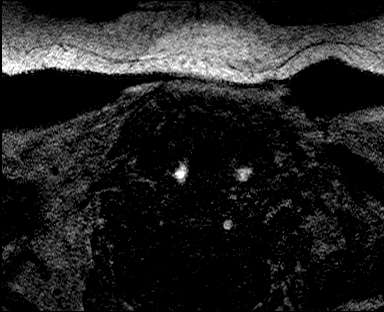
[im 29/133]
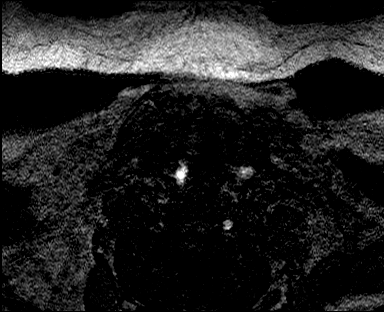
[im 31/133]
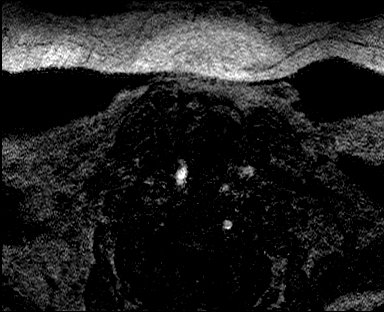
[im 34/133]
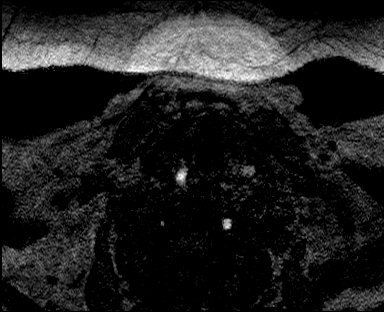
[im 37/133]
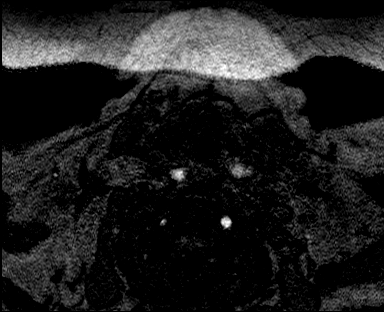
[im 40/133]
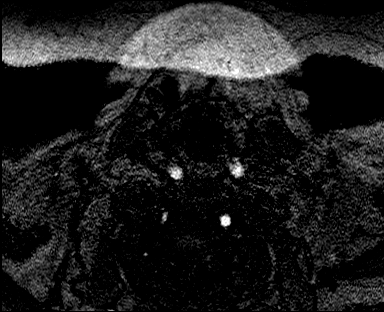
[im 43/133]
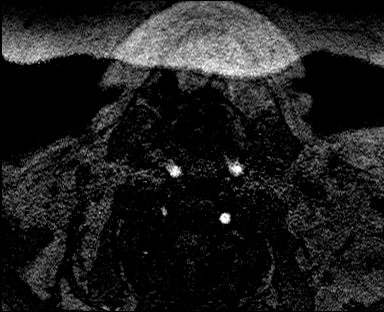
[im 45/133]
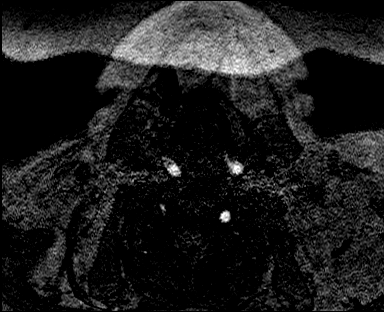
[im 48/133]
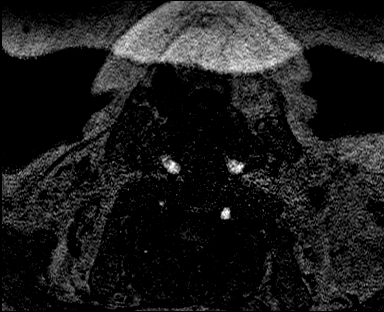
[im 51/133]
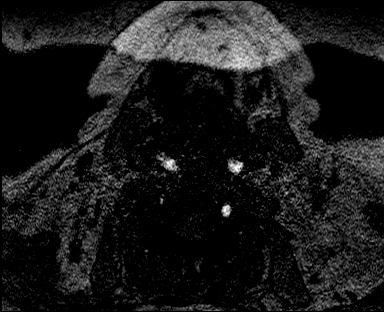
[im 54/133]
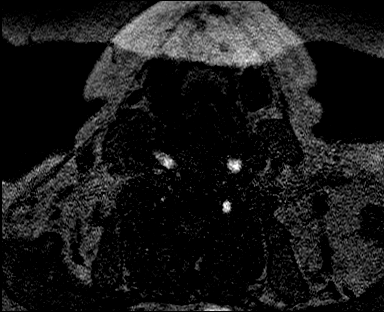
[im 57/133]
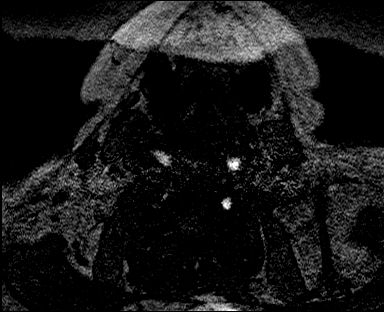
[im 59/133]
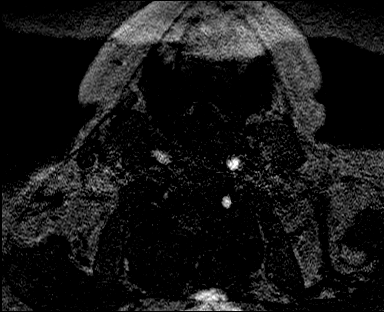
[im 62/133]
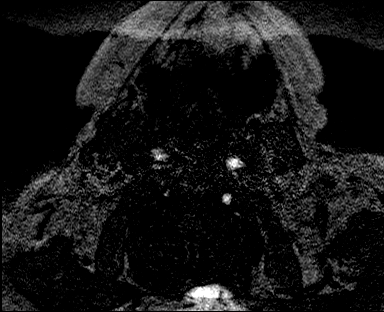
[im 65/133]
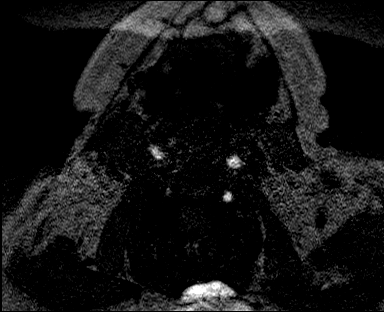
[im 68/133]
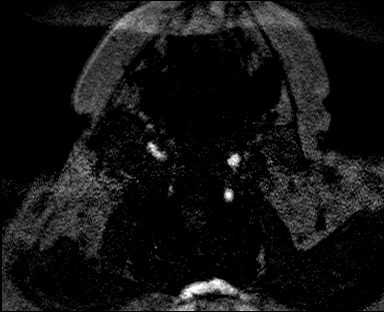
[im 71/133]
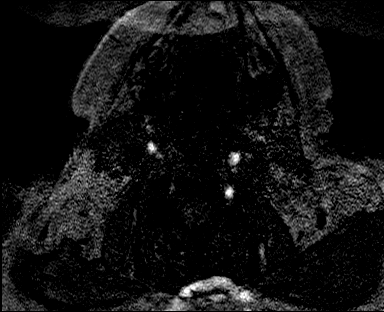
[im 74/133]
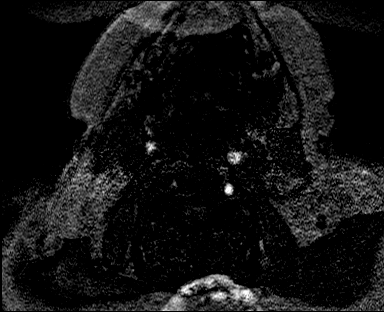
[im 76/133]
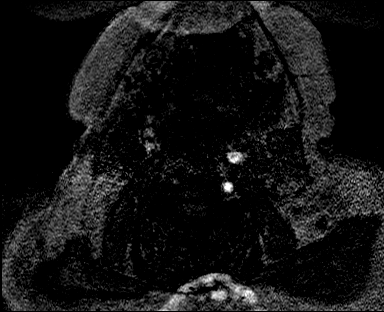
[im 79/133]
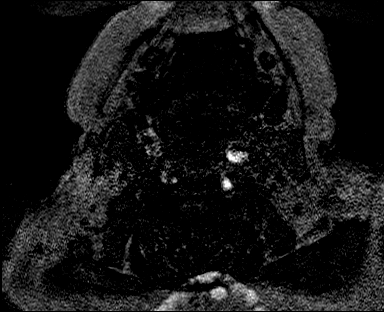
[im 82/133]
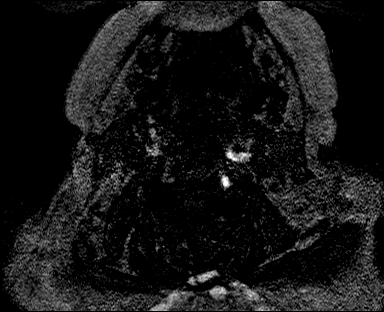
[im 85/133]
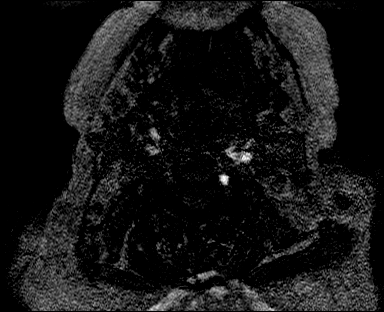
[im 88/133]
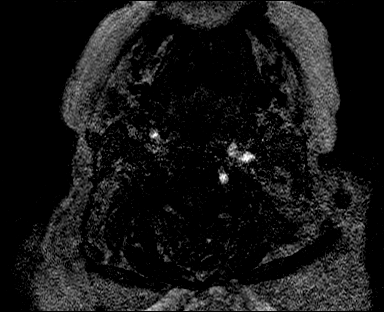
[im 90/133]
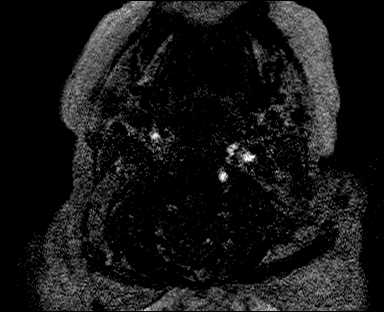
[im 93/133]
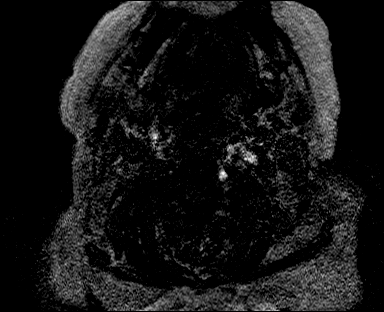
[im 96/133]
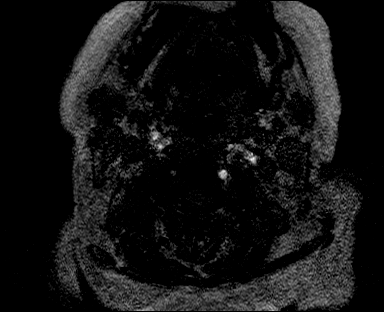
[im 99/133]
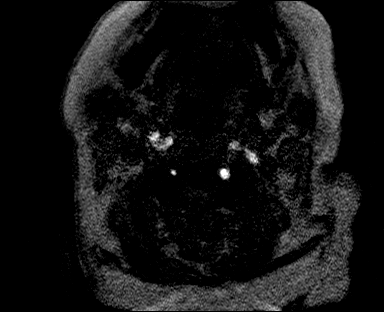
[im 102/133]
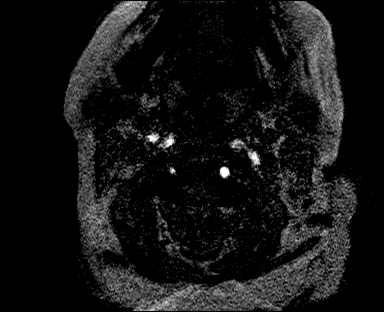
[im 104/133]
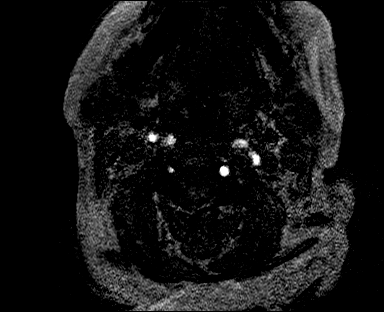
[im 110/133]
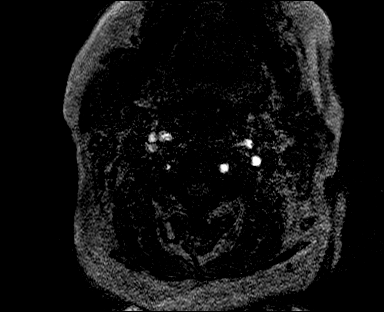
[im 113/133]
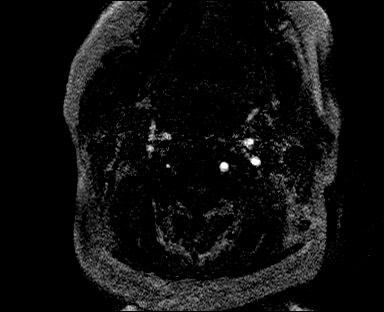
[im 127/133]
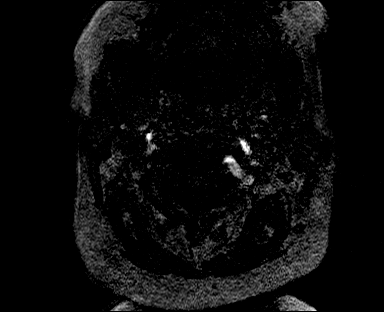

[41 of 48 positions shown; findings below may reference images not displayed]

FINDINGS: POSTERIOR CIRCULATION:

--Vertebral arteries: Normal

--Inferior cerebellar arteries: Normal.

--Basilar artery: Normal.

--Superior cerebellar arteries: Normal.

--Posterior cerebral arteries: Normal.

ANTERIOR CIRCULATION:

--Intracranial internal carotid arteries: Normal.

--Anterior cerebral arteries (ACA): Normal.

--Middle cerebral arteries (MCA): Normal.

ANATOMIC VARIANTS: None
IMPRESSION: Normal intracranial MRA.

## 2020-07-17 IMAGING — MR MR HEAD W/O CM
14 of 17 series · 30 of 48 positions shown · non-contrast
Comparison: None.

CLINICAL DATA: Encephalopathy

EXAM:
MRI HEAD WITHOUT CONTRAST
TECHNIQUE: Multiplanar, multiecho pulse sequences of the brain and surrounding
structures were obtained without intravenous contrast.

[Series 3: ax dwi_tracew · axial · 3.0mm · 1.31mm/px · 1 of 48 slices shown]
[im 1/48]
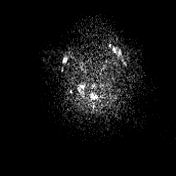

[Series 4: ax dwi_adc · axial · 3.0mm · 1.31mm/px · z∈[-158,-12]mm · 2 of 45 slices shown]
[im 1/45]
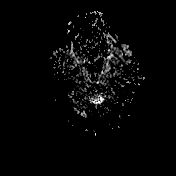
[im 45/45]
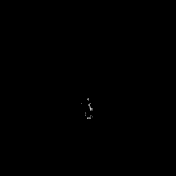

[Series 5: cor dwi_tracew · coronal · 5.0mm · 1.31mm/px · 2 of 38 slices shown]
[im 1/38]
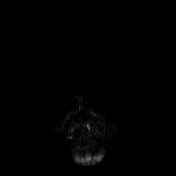
[im 38/38]
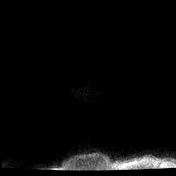

[Series 6: cor dwi_adc · coronal · 5.0mm · 1.31mm/px · 2 of 38 slices shown]
[im 1/38]
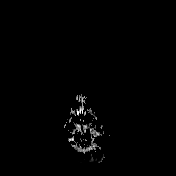
[im 38/38]
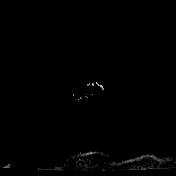

[Series 7: T1 · sagittal · 5.0mm · 0.94mm/px · 1 of 21 slices shown (1 of 2)]
[im 1/21]
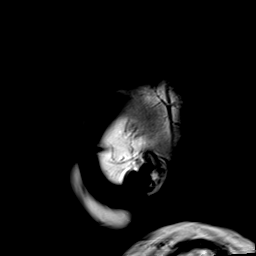

[Series 13: T2 · axial · 5.0mm · 0.45mm/px · 1 of 27 slices shown (1 of 2)]
[im 1/27]
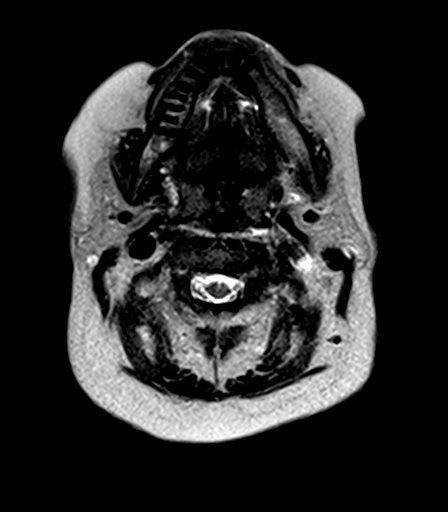

[Series 14: T2-star · axial · 5.0mm · 0.45mm/px · 1 of 27 slices shown]
[im 1/27]
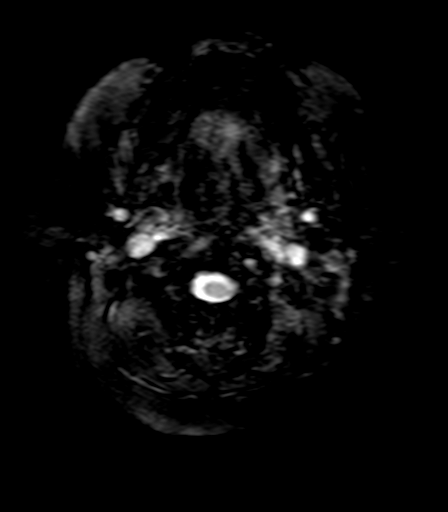

[Series 15: FLAIR · axial · 5.0mm · 1.20mm/px · 1 of 27 slices shown]
[im 1/27]
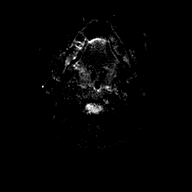

[Series 16: T1 · axial · 5.0mm · 0.90mm/px · 1 of 27 slices shown (2 of 2)]
[im 1/27]
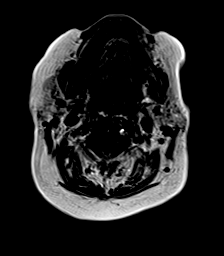

[Series 17: T2 · coronal · 5.0mm · 0.45mm/px · 2 of 31 slices shown (2 of 2)]
[im 1/31]
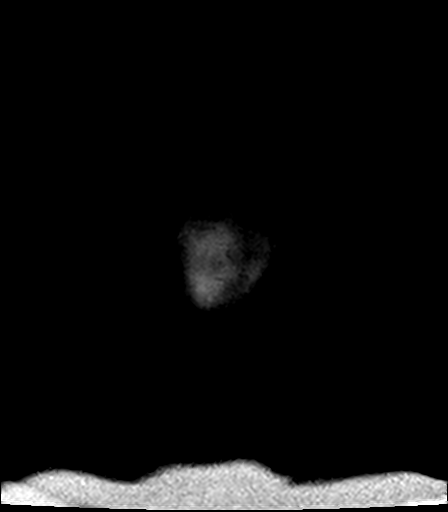
[im 31/31]
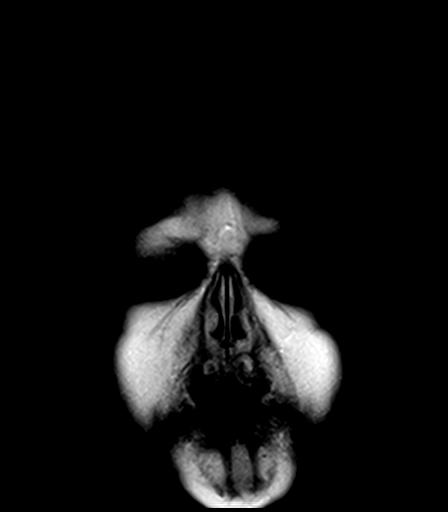

[Series 29: angio_fl3d_cor_post_ttc=2.0s · coronal · 0.9mm · 0.85mm/px · 5 of 96 slices shown]
[im 1/96]
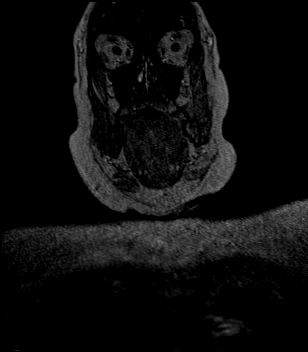
[im 24/96]
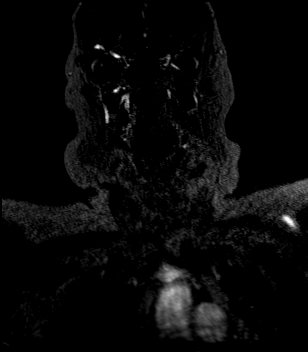
[im 48/96]
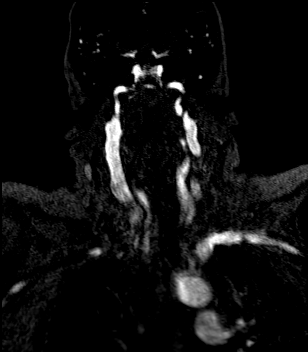
[im 72/96]
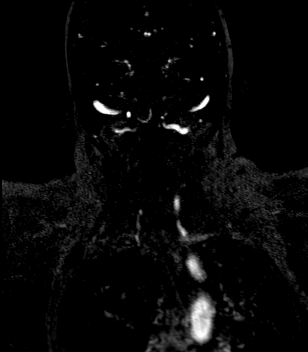
[im 96/96]
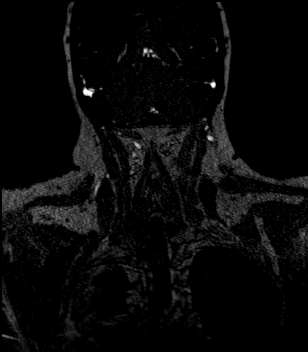

[Series 30: angio_fl3d_cor_post_ttc=2.0s_moco-adv · coronal · 0.9mm · 0.85mm/px · 5 of 96 slices shown]
[im 1/96]
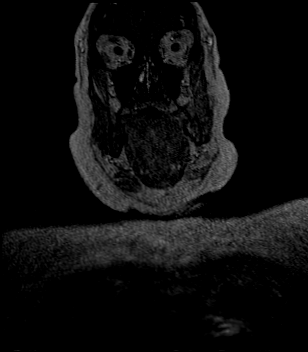
[im 24/96]
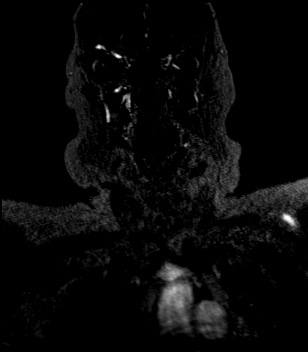
[im 48/96]
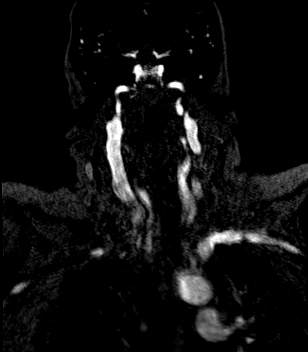
[im 72/96]
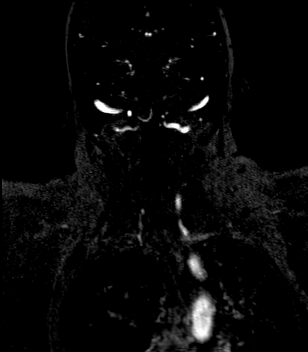
[im 96/96]
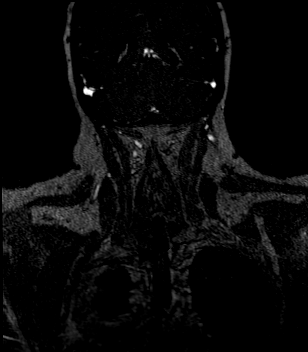

[Series 31: angio_fl3d_cor_post_ttc=2.0s_moco-adv_sub · coronal · 0.9mm · 0.85mm/px · 5 of 92 slices shown]
[im 1/92]
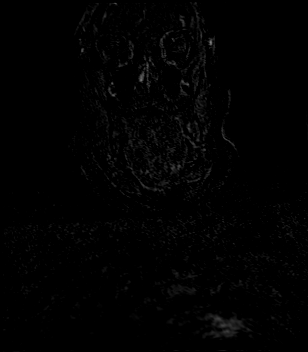
[im 23/92]
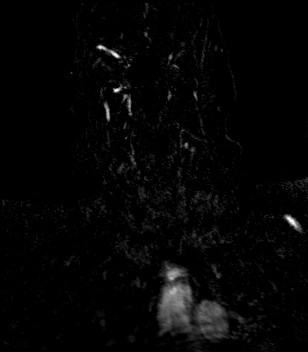
[im 46/92]
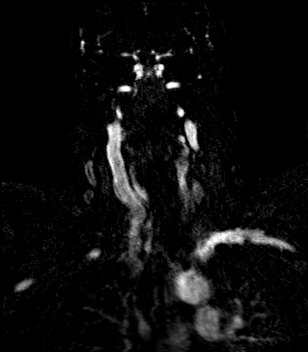
[im 69/92]
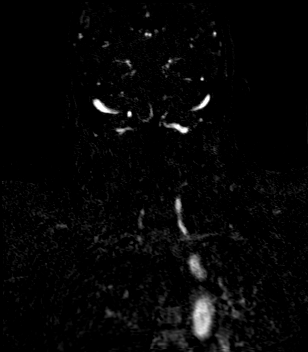
[im 92/92]
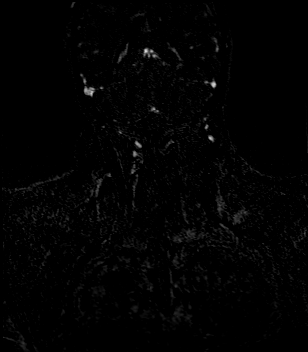

[Series 1178: h-f · axial · 0.9mm · 0.43mm/px · 1 of 6 slices shown]
[im 1/6]
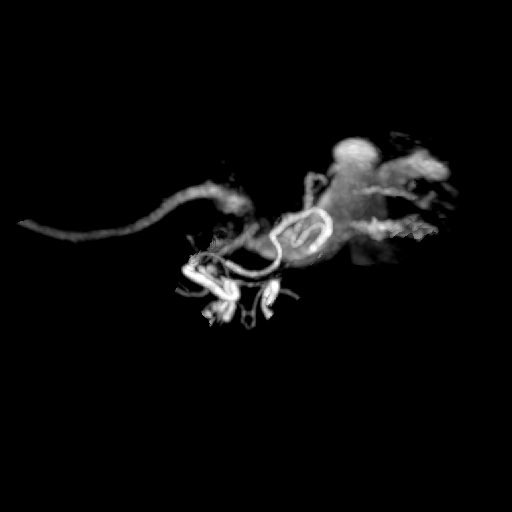

[30 of 48 positions shown; findings below may reference images not displayed]

FINDINGS: Brain: Multifocal bilateral acute ischemia. The largest areas of
infarction are in the frontal lobes, but there are areas of ischemia
in both parietal lobes, both occipital lobes and the right greater
than left cerebellar hemispheres. No acute or chronic hemorrhage.
There is multifocal hyperintense T2-weighted signal within the white
matter. Parenchymal volume and CSF spaces are normal. The midline
structures are normal.

Vascular: Major flow voids are preserved.

Skull and upper cervical spine: Normal calvarium and skull base.
Visualized upper cervical spine and soft tissues are normal.

Sinuses/Orbits:Left maxillary sinus mucosal thickening. No mastoid
or middle ear effusion. Normal orbits.
IMPRESSION: 1. Multifocal bilateral acute ischemia. The largest areas are in the
frontal lobes, but there are lesions in multiple vascular
territories bilaterally. This is most consistent with a cardiac or
aortic embolic source.
2. No hemorrhage or mass effect.

## 2020-07-17 IMAGING — US US RENAL
1 series · 15 of 25 positions shown · non-contrast
Comparison: None.

CLINICAL DATA: Acute kidney injury

EXAM:
RENAL / URINARY TRACT ULTRASOUND COMPLETE

[Series 1: us renal · 15 of 60 slices shown]
[im 1/60]
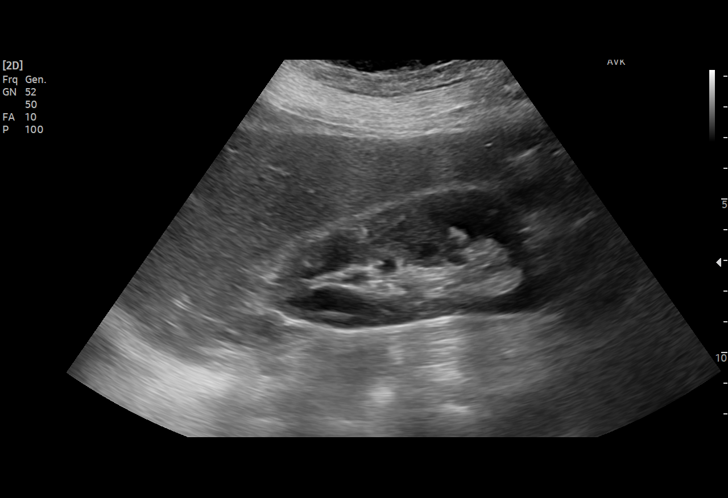
[im 5/60]
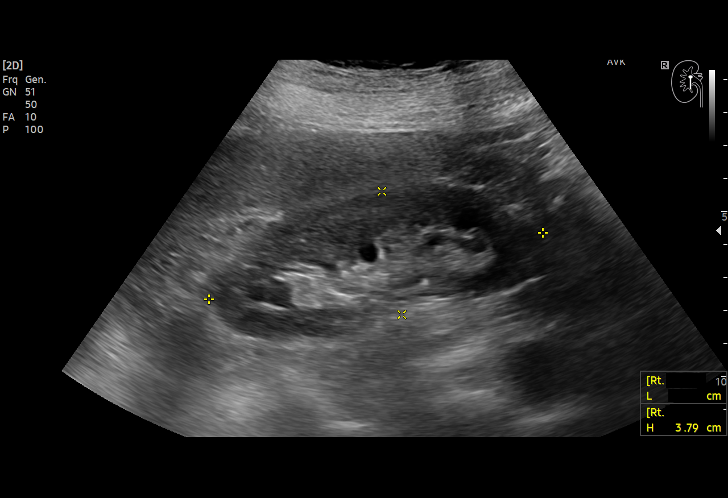
[im 10/60]
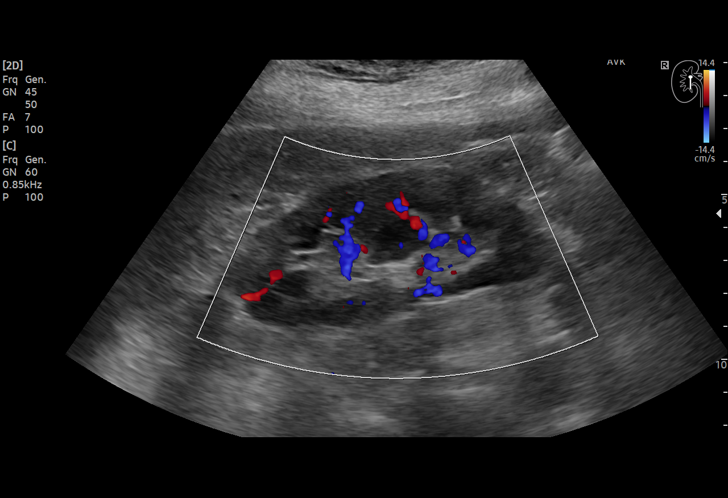
[im 13/60]
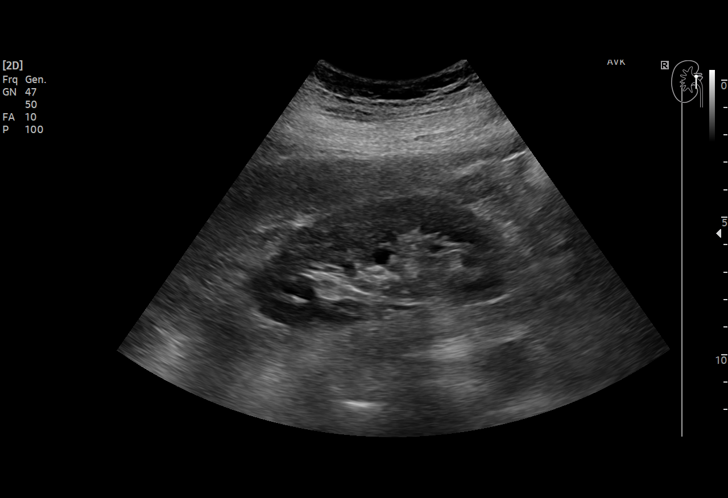
[im 18/60]
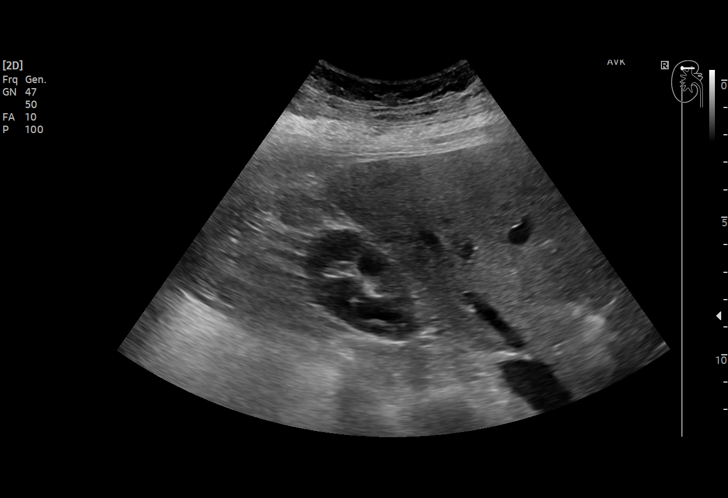
[im 23/60]
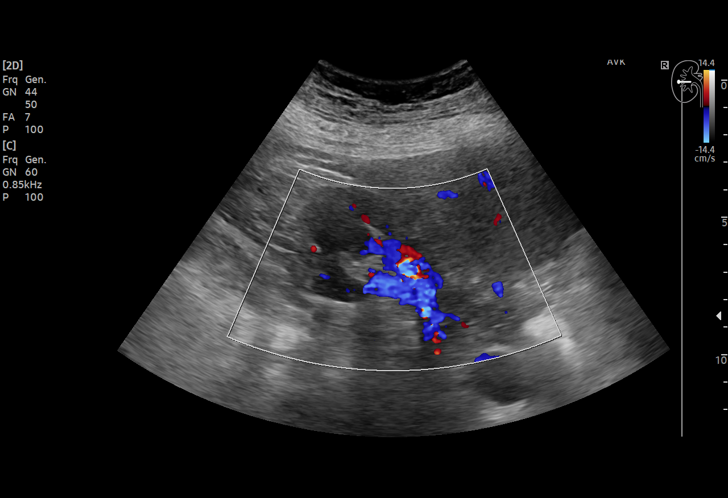
[im 25/60]
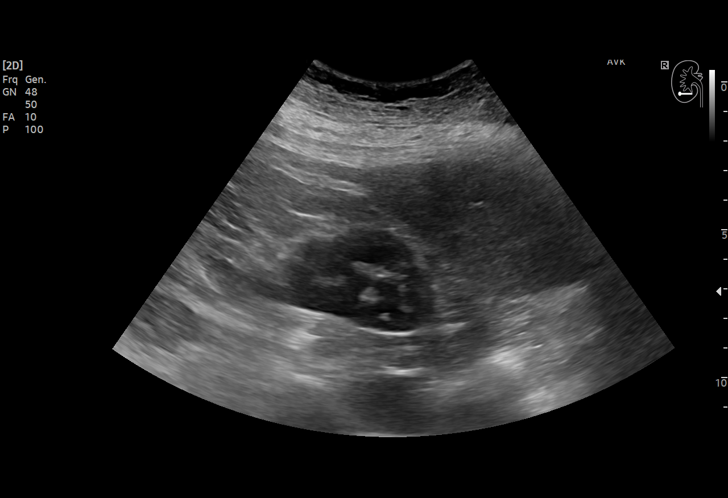
[im 30/60]
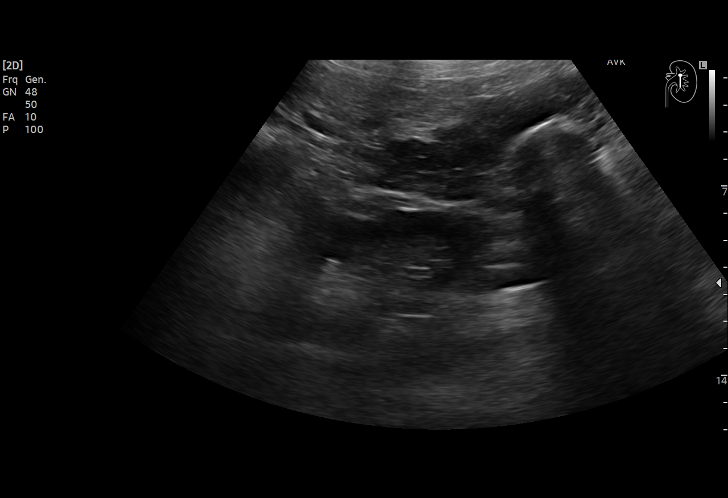
[im 35/60]
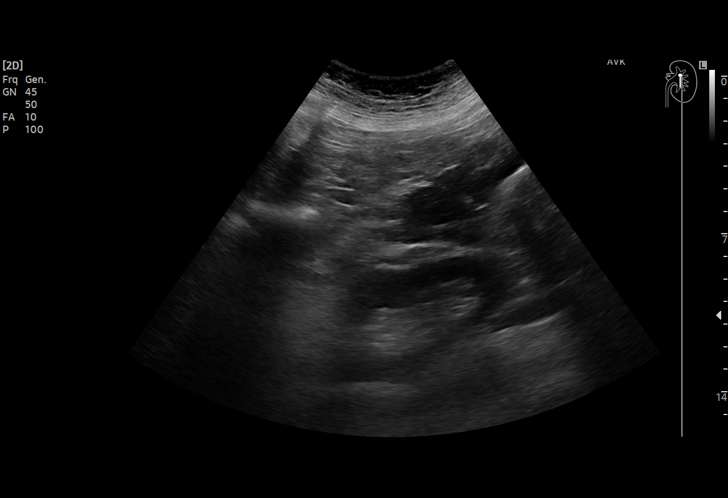
[im 37/60]
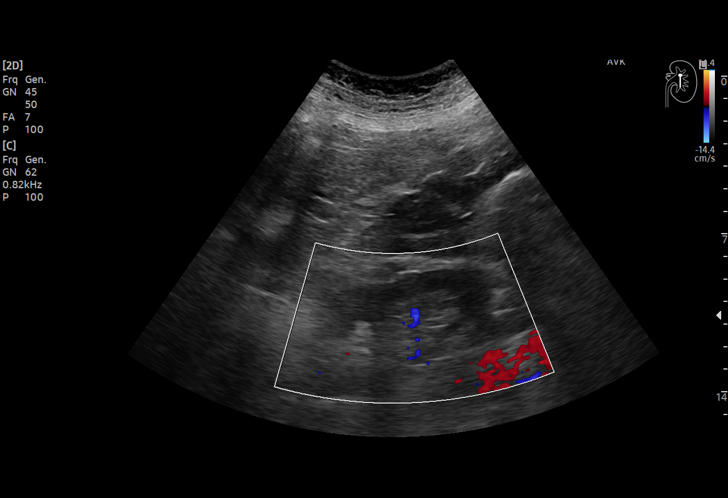
[im 42/60]
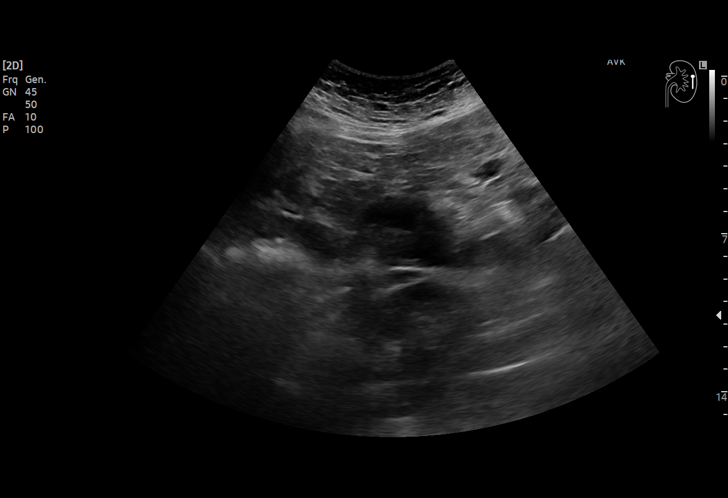
[im 47/60]
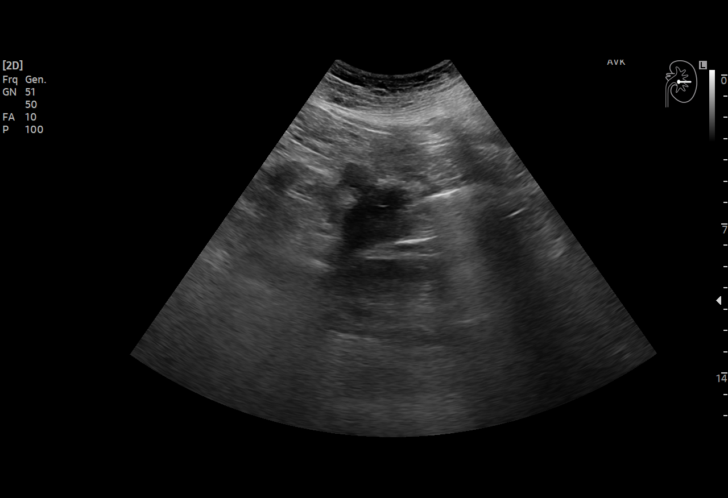
[im 50/60]
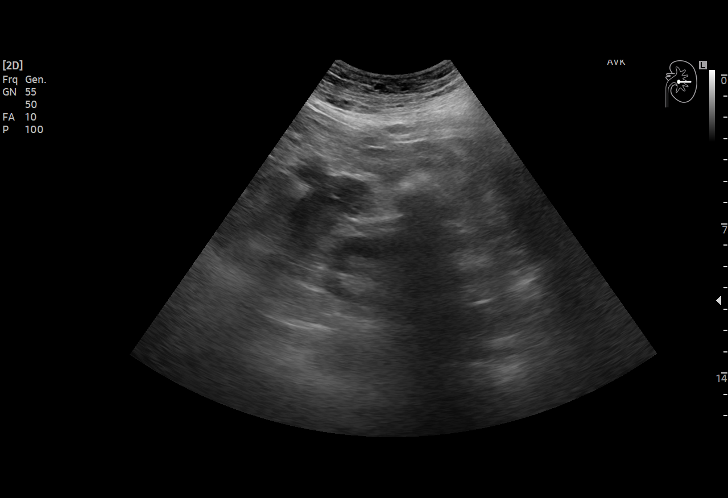
[im 55/60]
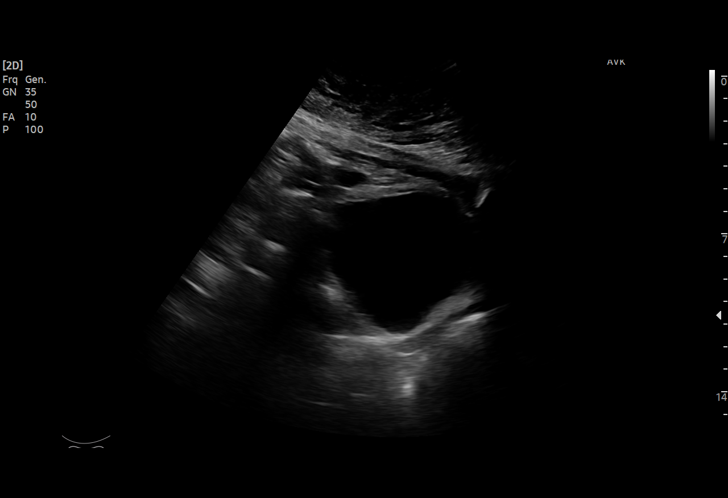
[im 60/60]
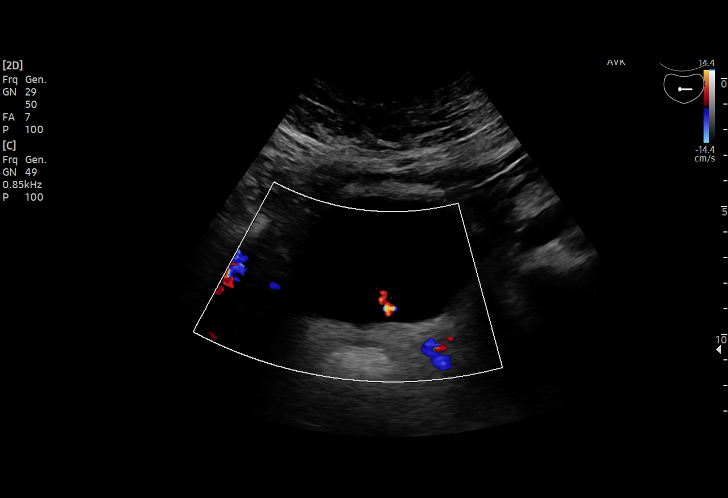

[15 of 25 positions shown; findings below may reference images not displayed]

FINDINGS: Right Kidney:

Renal measurements: 10.3 x 3.8 x 4.4 cm = volume: 90 mL.
Echogenicity within normal limits. No mass or hydronephrosis
visualized.

Left Kidney:

Renal measurements: 9.6 x 4.8 x 4.3 cm = volume: 95 mL. Echogenicity
within normal limits. No mass or hydronephrosis visualized.

Bladder:

Appears normal for degree of bladder distention.

Other:

None.
IMPRESSION: No evidence of hydronephrosis or other acute renal abnormality.

## 2020-07-17 MED ORDER — SODIUM CHLORIDE 0.9% IV SOLUTION
Freq: Once | INTRAVENOUS | Status: AC
  Filled 2020-07-17: qty 250

## 2020-07-17 MED ORDER — LORAZEPAM 2 MG/ML IJ SOLN
0.5000 mg | Freq: Four times a day (QID) | INTRAMUSCULAR | Status: DC | PRN
  Administered 2020-07-17 – 2020-07-22 (×3): 0.5 mg via INTRAVENOUS
  Filled 2020-07-17 (×3): qty 1

## 2020-07-17 MED ORDER — VANCOMYCIN VARIABLE DOSE PER UNSTABLE RENAL FUNCTION (PHARMACIST DOSING): Status: DC

## 2020-07-17 MED ORDER — STROKE: EARLY STAGES OF RECOVERY BOOK: Freq: Once | Status: DC

## 2020-07-17 MED ORDER — ACETAMINOPHEN 325 MG PO TABS: 650.0000 mg | ORAL_TABLET | Freq: Four times a day (QID) | ORAL | Status: DC | PRN

## 2020-07-17 MED ORDER — SODIUM CHLORIDE 0.9% FLUSH
3.0000 mL | Freq: Two times a day (BID) | INTRAVENOUS | Status: DC
  Administered 2020-07-17 – 2020-07-29 (×23): 3 mL via INTRAVENOUS

## 2020-07-17 MED ORDER — BISACODYL 10 MG RE SUPP
10.0000 mg | Freq: Every day | RECTAL | Status: DC | PRN
  Filled 2020-07-17: qty 1

## 2020-07-17 MED ORDER — GADOBUTROL 1 MMOL/ML IV SOLN
10.0000 mL | Freq: Once | INTRAVENOUS | Status: AC | PRN
  Administered 2020-07-17: 10 mL via INTRAVENOUS

## 2020-07-17 MED ORDER — SODIUM CHLORIDE 0.9 % IV SOLN: 2.0000 g | INTRAVENOUS | Status: DC

## 2020-07-17 MED ORDER — SODIUM CHLORIDE 0.9 % IV SOLN
1.0000 mg | Freq: Once | INTRAVENOUS | Status: AC
  Administered 2020-07-18: 1 mg via INTRAVENOUS
  Filled 2020-07-17: qty 0.2

## 2020-07-17 MED ORDER — SODIUM CHLORIDE 0.9 % IV BOLUS: 1000.0000 mL | Freq: Once | INTRAVENOUS | Status: DC

## 2020-07-17 MED ORDER — ACETAMINOPHEN 650 MG RE SUPP: 650.0000 mg | Freq: Four times a day (QID) | RECTAL | Status: DC | PRN

## 2020-07-17 MED ORDER — IPRATROPIUM-ALBUTEROL 0.5-2.5 (3) MG/3ML IN SOLN
3.0000 mL | RESPIRATORY_TRACT | Status: DC | PRN
  Administered 2020-07-17 – 2020-07-24 (×5): 3 mL via RESPIRATORY_TRACT
  Filled 2020-07-17 (×3): qty 3

## 2020-07-17 MED ORDER — SODIUM CHLORIDE 0.9 % IV SOLN
200.0000 mg | Freq: Once | INTRAVENOUS | Status: AC
  Administered 2020-07-17: 200 mg via INTRAVENOUS
  Filled 2020-07-17: qty 10

## 2020-07-17 MED ORDER — PERFLUTREN LIPID MICROSPHERE
1.0000 mL | INTRAVENOUS | Status: AC | PRN
Start: 2020-07-17 — End: 2020-07-17
  Administered 2020-07-17: 3 mL via INTRAVENOUS
  Filled 2020-07-17: qty 10

## 2020-07-17 MED ORDER — SODIUM CHLORIDE 0.9 % IV SOLN: INTRAVENOUS | Status: DC

## 2020-07-17 MED ORDER — IPRATROPIUM-ALBUTEROL 0.5-2.5 (3) MG/3ML IN SOLN
3.0000 mL | Freq: Four times a day (QID) | RESPIRATORY_TRACT | Status: DC
  Administered 2020-07-17 – 2020-07-24 (×23): 3 mL via RESPIRATORY_TRACT
  Filled 2020-07-17 (×24): qty 3

## 2020-07-17 MED ORDER — CEFAZOLIN SODIUM-DEXTROSE 1-4 GM/50ML-% IV SOLN
1.0000 g | Freq: Two times a day (BID) | INTRAVENOUS | Status: DC
  Administered 2020-07-18 (×2): 1 g via INTRAVENOUS
  Filled 2020-07-17 (×5): qty 50

## 2020-07-17 MED ORDER — BUDESONIDE 0.5 MG/2ML IN SUSP
0.5000 mg | Freq: Two times a day (BID) | RESPIRATORY_TRACT | Status: DC
  Administered 2020-07-17 – 2020-07-28 (×21): 0.5 mg via RESPIRATORY_TRACT
  Filled 2020-07-17 (×23): qty 2

## 2020-07-17 NOTE — Sepsis Progress Note (Signed)
Notified bedside nurse of need to draw repeat lactic acid. 

## 2020-07-17 NOTE — Progress Notes (Signed)
*  PRELIMINARY RESULTS* Echocardiogram 2D Echocardiogram has been performed.  Brandi Holland 07/17/2020, 2:07 PM

## 2020-07-17 NOTE — Sepsis Progress Note (Signed)
Secure chat with provider regarding fluid bolus per protocol. Full amount of fluids not given due to concern for volume overload.

## 2020-07-17 NOTE — ED Notes (Signed)
Pt husband at bedside. Husband updated on POC. 1:1 sitter relieved at this time

## 2020-07-17 NOTE — ED Notes (Signed)
Pt with soiled linens. This RN and Maia Breslow, RN cleaned pt. Clean and dry sheets and brieg in place.

## 2020-07-17 NOTE — ED Notes (Addendum)
Pt remains restless in bed with labored breathing and grunting. MD made aware. No new orders at this time

## 2020-07-17 NOTE — ED Notes (Signed)
Lab attempted blood culture draw without success

## 2020-07-17 NOTE — Progress Notes (Signed)
PT Cancellation Note  Patient Details Name: MARCI POLITO MRN: [id number] DOB: 09-Apr-1965   Cancelled Treatment:    Reason Eval/Treat Not Completed: Medical issues which prohibited therapy.  PT order received and chart reviewed. Pt's hemoglobin currently 5.7 and contraindicated for therapeutic intervention at this time. PT to hold evaluation today. PT will follow and re-attempt when pt is actively and safely able to participate.   Nolon Bussing, PT, DPT 07/17/20, 9:53 AM

## 2020-07-17 NOTE — Consult Note (Signed)
Hematology/Oncology Consult note [facility] [phone] [phone]  Patient Care Team: Pcp, No as PCP - General   Name of the patient: Brandi Holland  [id number]  10/28/1965   Date of visit: 07/17/20 REASON FOR COSULTATION:  Thrombocytopenia, anemia History of presenting illness-  55 y.o. female with PMH listed at below who presents to ER for evaluation of shortness of breath, and generalized weakness. Patient has altered mental status and not able to provide any history.  Medical history was obtained from chart review.  Patient was found to be hypoxic in the emergency room with O2 sats down to 65% and wheezing on examination.  Started on oxygen supplementation.  She is confused.  Per note, patient has been confused and largely confined to her bed for the last couple of weeks.  Family reports that patient had dark color diarrhea for about a week Lab work-up showed hyponatremia, acute kidney injury with a creatinine 3.1, increase of total bilirubin 2.6, increased lactic acid to 3.2, D-dimer 2.6, COVID 19 negative.  Acute drop of hemoglobin to 5.0 from her recent baseline of 9.8.  MCV 98, platelet count 10 1000, dropped from her baseline of 255000 a few weeks ago.  Significantly elevated BNP to 1768.  Increased troponin Iron panel showed ferritin of 108, decreased iron saturation 6, TIBC 389, decreased folate level for, normal vitamin B12 level 659, increased LDH 283, 07/16/2020, CT head without contrast showed area of low density in both frontal lobes concerning for acute to subacute infarcts.  MRI brain without contrast showed multifocal bilateral acute ischemia, the largest area are in the frontal lobe, lesions in multiple vascular territories bilaterally.  Consistent with a cardiac or aortic embolic source. MR angiogram of the neck with and without contrast showed no visible stenosis of acute abnormality of the carotid or vertebral  arteries.  Hematology oncology was consulted for thrombocytopenia, anemia, Patient was seen at the bedside, patient is lethargic, not able to provide any history.  RN is at the bedside planning to insert Foley catheter.  Patient moves her extremity spontaneously.  Review of Systems  Unable to perform ROS: Mental status change    Allergies  Allergen Reactions  . Clarithromycin Swelling  . Sulfa Antibiotics Swelling    Patient Active Problem List   Diagnosis Date Noted  . Acute encephalopathy 07/17/2020  . CVA (cerebral vascular accident) (HCC) 07/17/2020  . Asthma 07/17/2020  . Acute on chronic respiratory failure with hypoxia (HCC) 07/17/2020  . Acute renal failure superimposed on stage 3a chronic kidney disease (HCC) 07/17/2020  . Hyponatremia 07/17/2020  . OSA (obstructive sleep apnea) 07/17/2020  . AMS (altered mental status) 07/17/2020     Past Medical History:  Diagnosis Date  . Arthritis   . Asthma   . Fibromyalgia   . OSA (obstructive sleep apnea)   . Peripheral neuropathy      History reviewed. No pertinent surgical history.  Social History   Socioeconomic History  . Marital status: Married    Spouse name: Not on file  . Number of children: Not on file  . Years of education: Not on file  . Highest education level: Not on file  Occupational History  . Not on file  Tobacco Use  . Smoking status: Not on file  . Smokeless tobacco: Not on file  Substance and Sexual Activity  . Alcohol use: Not on file  . Drug use: Not on file  . Sexual activity: Not on file  Other  Topics Concern  . Not on file  Social History Narrative  . Not on file   Social Determinants of Health   Financial Resource Strain: Not on file  Food Insecurity: Not on file  Transportation Needs: Not on file  Physical Activity: Not on file  Stress: Not on file  Social Connections: Not on file  Intimate Partner Violence: Not on file     Family History  Problem Relation Age of  Onset  . Chorea Mother   . Emphysema Mother   . Hypertension Mother   . Thyroid disease Mother   . Ulcers Mother   . Dementia Father   . Cancer Father   . Cancer Sister   . Chorea Sister   . Heart disease Sister   . Osteoporosis Sister      Current Facility-Administered Medications:  .   stroke: mapping our early stages of recovery book, , Does not apply, Once, Synetta Fail, MD .  0.9 %  sodium chloride infusion, , Intravenous, Continuous, Synetta Fail, MD .  acetaminophen (TYLENOL) tablet 650 mg, 650 mg, Oral, Q6H PRN **OR** acetaminophen (TYLENOL) suppository 650 mg, 650 mg, Rectal, Q6H PRN, Synetta Fail, MD .  bisacodyl (DULCOLAX) suppository 10 mg, 10 mg, Rectal, Daily PRN, Synetta Fail, MD .  budesonide (PULMICORT) nebulizer solution 0.5 mg, 0.5 mg, Nebulization, BID, Harlon Ditty D, NP .  ipratropium-albuterol (DUONEB) 0.5-2.5 (3) MG/3ML nebulizer solution 3 mL, 3 mL, Nebulization, Q2H PRN, Synetta Fail, MD, 3 mL at 07/17/20 0538 .  ipratropium-albuterol (DUONEB) 0.5-2.5 (3) MG/3ML nebulizer solution 3 mL, 3 mL, Nebulization, Q6H, Harlon Ditty D, NP, 3 mL at 07/17/20 1605 .  LORazepam (ATIVAN) injection 0.5 mg, 0.5 mg, Intravenous, Q6H PRN, Arnetha Courser, MD, 0.5 mg at 07/17/20 1138 .  sodium chloride 0.9 % bolus 1,000 mL, 1,000 mL, Intravenous, Once, Beola Cord B, MD .  sodium chloride flush (NS) 0.9 % injection 3 mL, 3 mL, Intravenous, Q12H, Synetta Fail, MD, 3 mL at 07/17/20 1610  Current Outpatient Medications:  .  clotrimazole (LOTRIMIN) 1 % cream, Apply 1 application topically 2 (two) times daily., Disp: , Rfl:  .  PROAIR HFA 108 (90 Base) MCG/ACT inhaler, Inhale 2 puffs into the lungs every 4 (four) hours as needed., Disp: , Rfl:  .  furosemide (LASIX) 20 MG tablet, Take 20 mg by mouth daily., Disp: , Rfl:    Physical exam:  Vitals:   07/17/20 1700 07/17/20 1830 07/17/20 1833 07/17/20 1834  BP: 114/66  (!) 99/57    Pulse: 76 66 67 68  Resp: Temp:      TempSrc:      SpO2: 92% 96% 96% 99%  Weight:      Height:       Physical Exam Constitutional:      General: She is not in acute distress.    Appearance: She is not diaphoretic.  HENT:     Nose: Nose normal.     Mouth/Throat:     Pharynx: No oropharyngeal exudate.  Eyes:     General: No scleral icterus.    Pupils: Pupils are equal, round, and reactive to light.  Cardiovascular:     Rate and Rhythm: Normal rate.     Heart sounds: No murmur heard.   Pulmonary:     Effort: Pulmonary effort is normal. No respiratory distress.  Abdominal:     General: There is no distension.     Palpations:  Abdomen is soft.     Tenderness: There is no abdominal tenderness.  Musculoskeletal:        General: No swelling. Normal range of motion.  Skin:    General: Skin is warm.     Findings: No erythema.  Neurological:     Mental Status: She is alert.     Cranial Nerves: No cranial nerve deficit.     Motor: No abnormal muscle tone.     Coordination: Coordination normal.     Comments: Lethargic and confused  Psychiatric:        Mood and Affect: Affect normal.         CMP Latest Ref Rng & Units 07/17/2020  Glucose 70 - 99 mg/dL 098(J148(H)  BUN 6 - 20 mg/dL 19(J73(H)  Creatinine 4.780.44 - 1.00 mg/dL 2.95(A2.99(H)  Sodium 213135 - 086145 mmol/L 130(L)  Potassium 3.5 - 5.1 mmol/L 4.5  Chloride 98 - 111 mmol/L 96(L)  CO2 22 - 32 mmol/L 21(L)  Calcium 8.9 - 10.3 mg/dL 5.7(Q8.6(L)  Total Protein 6.5 - 8.1 g/dL 6.7  Total Bilirubin 0.3 - 1.2 mg/dL 2.5(H)  Alkaline Phos 38 - 126 U/L 57  AST 15 - 41 U/L 25  ALT 0 - 44 U/L 11   CBC Latest Ref Rng & Units 07/17/2020  WBC 4.0 - 10.5 K/uL 6.3  Hemoglobin 12.0 - 15.0 g/dL 4.6(N6.5(L)  Hematocrit 62.936.0 - 46.0 % 20.6(L)  Platelets 150 - 400 K/uL 80(L)    RADIOGRAPHIC STUDIES: I have personally reviewed the radiological images as listed and agreed with the findings in the report. DG Chest 2 View  Result Date:  06/18/2020 CLINICAL DATA:  55 year old female with complaint of bilateral leg swelling for the past several weeks. EXAM: CHEST - 2 VIEW COMPARISON:  Chest x-ray 06/18/2020. FINDINGS: Lung volumes are normal. No consolidative airspace disease. There is cephalization of the pulmonary vasculature and slight indistinctness of the interstitial markings suggestive of mild pulmonary edema. No definite pleural effusions. No pneumothorax. No definite suspicious appearing pulmonary nodules or masses are noted. Mild cardiomegaly. Upper mediastinal contours are within normal limits. IMPRESSION: 1. The appearance of the chest suggests mild congestive heart failure, as above. Electronically Signed   By: Trudie Reedaniel  Entrikin M.D.   On: 06/18/2020 19:11   CT Head Wo Contrast  Result Date: 07/16/2020 CLINICAL DATA:  Dizziness EXAM: CT HEAD WITHOUT CONTRAST TECHNIQUE: Contiguous axial images were obtained from the base of the skull through the vertex without intravenous contrast. COMPARISON:  None. FINDINGS: Brain: Areas of low-density noted in both frontal lobes concerning for acute to subacute infarcts. No hemorrhage or hydrocephalus. Vascular: No hyperdense vessel or unexpected calcification. Skull: No acute calvarial abnormality. Sinuses/Orbits: No acute findings Other: None IMPRESSION: Areas of low-density in both frontal lobes concerning for acute to subacute infarcts. Electronically Signed   By: Charlett NoseKevin  Dover M.D.   On: 07/16/2020 22:08   MR ANGIO HEAD WO CONTRAST  Result Date: 07/17/2020 CLINICAL DATA:  Subarachnoid hemorrhage EXAM: MRA HEAD WITHOUT CONTRAST TECHNIQUE: Angiographic images of the Circle of Willis were acquired using MRA technique without intravenous contrast. COMPARISON:  No pertinent prior exam. FINDINGS: POSTERIOR CIRCULATION: --Vertebral arteries: Normal --Inferior cerebellar arteries: Normal. --Basilar artery: Normal. --Superior cerebellar arteries: Normal. --Posterior cerebral arteries: Normal. ANTERIOR  CIRCULATION: --Intracranial internal carotid arteries: Normal. --Anterior cerebral arteries (ACA): Normal. --Middle cerebral arteries (MCA): Normal. ANATOMIC VARIANTS: None IMPRESSION: Normal intracranial MRA. Electronically Signed   By: Deatra RobinsonKevin  Herman M.D.   On: 07/17/2020 02:39   MR  Angiogram Neck W or Wo Contrast  Result Date: 07/17/2020 CLINICAL DATA:  Encephalopathy EXAM: MRA NECK WITHOUT AND WITH CONTRAST TECHNIQUE: Multiplanar and multiecho pulse sequences of the neck were obtained without and with intravenous contrast. Angiographic images of the neck were obtained using MRA technique without and with intravenous contrast. CONTRAST:  10mL GADAVIST GADOBUTROL 1 MMOL/ML IV SOLN COMPARISON:  None. FINDINGS: Images are degraded by motion. Within that limitation, there is no hemodynamically significant stenosis visualized within the carotid systems. The vertebral arteries are left-dominant. Both vertebral arteries are normal. IMPRESSION: Motion degraded MRA of the neck without visible stenosis or acute abnormality of the carotid or vertebral arteries. Electronically Signed   By: Deatra Robinson M.D.   On: 07/17/2020 03:59   MR BRAIN WO CONTRAST  Result Date: 07/17/2020 CLINICAL DATA:  Encephalopathy EXAM: MRI HEAD WITHOUT CONTRAST TECHNIQUE: Multiplanar, multiecho pulse sequences of the brain and surrounding structures were obtained without intravenous contrast. COMPARISON:  None. FINDINGS: Brain: Multifocal bilateral acute ischemia. The largest areas of infarction are in the frontal lobes, but there are areas of ischemia in both parietal lobes, both occipital lobes and the right greater than left cerebellar hemispheres. No acute or chronic hemorrhage. There is multifocal hyperintense T2-weighted signal within the white matter. Parenchymal volume and CSF spaces are normal. The midline structures are normal. Vascular: Major flow voids are preserved. Skull and upper cervical spine: Normal calvarium and skull  base. Visualized upper cervical spine and soft tissues are normal. Sinuses/Orbits:Left maxillary sinus mucosal thickening. No mastoid or middle ear effusion. Normal orbits. IMPRESSION: 1. Multifocal bilateral acute ischemia. The largest areas are in the frontal lobes, but there are lesions in multiple vascular territories bilaterally. This is most consistent with a cardiac or aortic embolic source. 2. No hemorrhage or mass effect. Electronically Signed   By: Deatra Robinson M.D.   On: 07/17/2020 02:33   US RENAL  Result Date: 07/17/2020 CLINICAL DATA:  Acute kidney injury EXAM: RENAL / URINARY TRACT ULTRASOUND COMPLETE COMPARISON:  None. FINDINGS: Right Kidney: Renal measurements: 10.3 x 3.8 x 4.4 cm = volume: 90 mL. Echogenicity within normal limits. No mass or hydronephrosis visualized. Left Kidney: Renal measurements: 9.6 x 4.8 x 4.3 cm = volume: 95 mL. Echogenicity within normal limits. No mass or hydronephrosis visualized. Bladder: Appears normal for degree of bladder distention. Other: None. IMPRESSION: No evidence of hydronephrosis or other acute renal abnormality. Electronically Signed   By: Malachy Moan M.D.   On: 07/17/2020 15:29   DG Chest Portable 1 View  Result Date: 07/16/2020 CLINICAL DATA:  Shortness of breath EXAM: PORTABLE CHEST 1 VIEW COMPARISON:  None. FINDINGS: Low lung volumes. Mild cardiomegaly. Diffuse interstitial prominence throughout the lungs. No effusions or pneumothorax. No acute bony abnormality. IMPRESSION: Cardiomegaly. Diffuse interstitial prominence could reflect interstitial edema or less likely atypical infection. Electronically Signed   By: Charlett Nose M.D.   On: 07/16/2020 20:41   ECHOCARDIOGRAM COMPLETE  Result Date: 07/17/2020    ECHOCARDIOGRAM REPORT   Patient Name:   TIA HIERONYMUS Date of Exam: 07/17/2020 Medical Rec #:  [id number]       Height:       57.0 in Accession #:    [id number]      Weight:       231.5 lb Date of Birth:  [birth year]        BSA:           1.914 m Patient Age:    37 years  BP:           106/49 mmHg Patient Gender: F               HR:           71 bpm. Exam Location:  [facility] Procedure: 2D Echo, Color Doppler, Cardiac Doppler and Intracardiac            Opacification Agent Indications:     I63.9 Stroke  History:         Patient has no prior history of Echocardiogram examinations.                  Signs/Symptoms:Shortness of Breath; Risk Factors:Sleep Apnea                  and Current Smoker.  Sonographer:     Humphrey Rolls RDCS (AE) Referring Phys:  [id number] Cecille Po MELVIN Diagnosing Phys: Lorine Bears MD  Sonographer Comments: Suboptimal apical window. Image acquisition challenging due to patient body habitus and Image acquisition challenging due to uncooperative patient. IMPRESSIONS  1. Left ventricular ejection fraction, by estimation, is 50 to 55%. The left ventricle has low normal function. The left ventricle has no regional wall motion abnormalities. Left ventricular diastolic parameters are indeterminate.  2. Right ventricular systolic function is normal. The right ventricular size is normal. Tricuspid regurgitation signal is inadequate for assessing PA pressure.  3. Left atrial size was mildly dilated.  4. Right atrial size was mildly dilated.  5. The mitral valve is normal in structure. No evidence of mitral valve regurgitation. No evidence of mitral stenosis.  6. The aortic valve is normal in structure. Aortic valve regurgitation is mild. Mild aortic valve stenosis.  7. The inferior vena cava is dilated in size with <50% respiratory variability, suggesting right atrial pressure of 15 mmHg. FINDINGS  Left Ventricle: Left ventricular ejection fraction, by estimation, is 50 to 55%. The left ventricle has low normal function. The left ventricle has no regional wall motion abnormalities. Definity contrast agent was given IV to delineate the left ventricular endocardial borders. The left ventricular internal cavity size was normal in size.  There is no left ventricular hypertrophy. Left ventricular diastolic parameters are indeterminate. Right Ventricle: The right ventricular size is normal. No increase in right ventricular wall thickness. Right ventricular systolic function is normal. Tricuspid regurgitation signal is inadequate for assessing PA pressure. Left Atrium: Left atrial size was mildly dilated. Right Atrium: Right atrial size was mildly dilated. Pericardium: There is no evidence of pericardial effusion. Mitral Valve: The mitral valve is normal in structure. No evidence of mitral valve regurgitation. No evidence of mitral valve stenosis. MV peak gradient, 3.7 mmHg. The mean mitral valve gradient is 2.0 mmHg. Tricuspid Valve: The tricuspid valve is normal in structure. Tricuspid valve regurgitation is mild . No evidence of tricuspid stenosis. Aortic Valve: The aortic valve is normal in structure. Aortic valve regurgitation is mild. Mild aortic stenosis is present. Aortic valve mean gradient measures 13.0 mmHg. Aortic valve peak gradient measures 20.8 mmHg. Aortic valve area, by VTI measures 1.59 cm. Pulmonic Valve: The pulmonic valve was normal in structure. Pulmonic valve regurgitation is trivial. No evidence of pulmonic stenosis. Aorta: The aortic root is normal in size and structure. Venous: The inferior vena cava is dilated in size with less than 50% respiratory variability, suggesting right atrial pressure of 15 mmHg. IAS/Shunts: No atrial level shunt detected by color flow Doppler.  LEFT VENTRICLE PLAX 2D LVIDd:  4.90 cm  Diastology LVIDs:         3.40 cm  LV e' medial:    7.51 cm/s LV PW:         1.10 cm  LV E/e' medial:  12.0 LV IVS:        0.80 cm  LV e' lateral:   7.72 cm/s LVOT diam:     2.00 cm  LV E/e' lateral: 11.7 LV SV:         65 LV SV Index:   34 LVOT Area:     3.14 cm  RIGHT VENTRICLE RV Basal diam:  3.50 cm LEFT ATRIUM             Index       RIGHT ATRIUM           Index LA diam:        4.50 cm 2.35 cm/m  RA Area:      21.50 cm LA Vol (A2C):   47.4 ml 24.77 ml/m RA Volume:   65.60 ml  34.27 ml/m LA Vol (A4C):   66.3 ml 34.64 ml/m LA Biplane Vol: 58.2 ml 30.41 ml/m  AORTIC VALVE                    PULMONIC VALVE AV Area (Vmax):    1.37 cm     PV Vmax:       1.10 m/s AV Area (Vmean):   1.20 cm     PV Vmean:      67.600 cm/s AV Area (VTI):     1.59 cm     PV VTI:        0.185 m AV Vmax:           228.00 cm/s  PV Peak grad:  4.8 mmHg AV Vmean:          168.000 cm/s PV Mean grad:  2.0 mmHg AV VTI:            0.407 m AV Peak Grad:      20.8 mmHg AV Mean Grad:      13.0 mmHg LVOT Vmax:         99.20 cm/s LVOT Vmean:        64.100 cm/s LVOT VTI:          0.206 m LVOT/AV VTI ratio: 0.51  AORTA Ao Root diam: 3.10 cm MITRAL VALVE MV Area (PHT): 4.89 cm    SHUNTS MV Area VTI:   2.10 cm    Systemic VTI:  0.21 m MV Peak grad:  3.7 mmHg    Systemic Diam: 2.00 cm MV Mean grad:  2.0 mmHg MV Vmax:       0.96 m/s MV Vmean:      64.1 cm/s MV Decel Time: 155 msec MV E velocity: 90.00 cm/s MV A velocity: 87.00 cm/s MV E/A ratio:  1.03 Lorine Bears MD Electronically signed by Lorine Bears MD Signature Date/Time: 07/17/2020/5:00:35 PM    Final    CT CHEST ABDOMEN PELVIS WO CONTRAST  Result Date: 07/16/2020 CLINICAL DATA:  Shortness of breath. EXAM: CT CHEST, ABDOMEN AND PELVIS WITHOUT CONTRAST TECHNIQUE: Multidetector CT imaging of the chest, abdomen and pelvis was performed following the standard protocol without IV contrast. COMPARISON:  None. FINDINGS: CT CHEST FINDINGS Cardiovascular: Heart is mildly enlarged.  Aorta normal caliber. Mediastinum/Nodes: No mediastinal, hilar, or axillary adenopathy. Trachea and esophagus are unremarkable. Thyroid unremarkable. Lungs/Pleura: Ground-glass airspace opacities within the upper lobes bilaterally, left greater than  right as well as left lower lobe. No effusions or pneumothorax. Musculoskeletal: Chest wall soft tissues are unremarkable. No acute bony abnormality. CT ABDOMEN PELVIS FINDINGS  Hepatobiliary: No focal liver abnormality is seen. Status post cholecystectomy. No biliary dilatation. Pancreas: No focal abnormality or ductal dilatation. Spleen: No focal abnormality.  Normal size. Adrenals/Urinary Tract: Fullness of the left adrenal gland compatible with hyperplasia. Right adrenal gland and kidneys unremarkable. No stones or hydronephrosis. Stomach/Bowel: Stomach, large and small bowel grossly unremarkable. Vascular/Lymphatic: Aortic atherosclerosis. No evidence of aneurysm or adenopathy. Reproductive: Prior hysterectomy.  No adnexal masses. Other: No free fluid or free air. Musculoskeletal: No acute bony abnormality. IMPRESSION: Cardiomegaly. Mild ground-glass opacities in the lungs, left greater than right. This could reflect asymmetric edema or infection. No acute findings in the abdomen or pelvis. Aortic atherosclerosis. Electronically Signed   By: Charlett Nose M.D.   On: 07/16/2020 22:13    Assessment and plan-  #Acute anemia and thrombocytopenia Check smear, direct bilirubin, reticulocyte panel, immature platelet fraction, DAT, haptoglobin, TSH, -Blood smear showed moderate thrombocytopenia without clumping.  RBC shows anisocytosis and few nucleated RBCs.  No morphologic changes to indicate micro angiopathic hemolysis.  No abnormal or immature leukocytes.  No increase of schistocytes. Increased immature platelet fraction, reticulocytosis, normal reticulocyte hemoglobin, normal TSH, mildly increased LDH, increased of both direct bilirubin and total bilirubin-this is not typical for unconjugated hyperbilirubinemia.. Overall picture is consistent with increased marrow RBC production, secondary to possible hemolysis, or blood loss. She has decreased iron saturation, there is history reported from family that patient has had dark color diarrhea recently. Less likely MAHA given lack of schistocytes, close monitor. Likely not warm AIHA given the negative DAT Check cold agglutinin,  PNH, flow cytometry, uric acid Agree with stabilizing patient with hydration, PRBC transfusion to keep hemoglobin above 7 Replete folic acid for folate deficiency, IV Venofer for underlying iron deficiency  Increased to both direct bilirubin and total bilirubin.  Check ultrasound liver and spleen. Encephalopathy secondary to CVA versus hypoxic injuries.  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  Need to rule out infectious endocarditis and irregular rhythm as etiologies.  Acute kidney failure, probably due to poor oral intake and dehydration.  Continue IV hydration. Acute CHF, cardiology is consulted. Lactic acidosis, questionable secondary to dehydration.   Thank you for allowing me to participate in the care of this patient.   Rickard Patience, MD, PhD Hematology Oncology [facility] Pager- [phone] 07/17/2020

## 2020-07-17 NOTE — ED Notes (Addendum)
Pt found to have brief saturated with dark amber urine. Girguis MD messaged regarding in and out cath. Verbal order for in and out cath to obtain urine specimen. In and out cath performed by this RN with assistance from [facility]. Brief removed and pericare performed. Skin found to be broken down in areas. See skin assessment. Clean brief and chux pad placed. Barrier cream applied to peri area and between buttocks. Fresh purewick placed and secured with brief. Connected to suction. Pt gave permission for shirt to be cut off as it was also covered with some urine. Shirt removed. Pt wiped with bath wipes. Gown placed on pt. Fresh warm blankets placed on pt.

## 2020-07-17 NOTE — Progress Notes (Signed)
OT Cancellation Note  Patient Details Name: Brandi Holland MRN: [id number] DOB: March 27, 1965   Cancelled Treatment:    Reason Eval/Treat Not Completed: Medical issues which prohibited therapy. OT order received and chart reviewed. Pt's hemoglobin currently 5.7 and contraindicated for therapeutic intervention at this time. OT to hold evaluation today. OT will follow and  re-attempt when pt is able to actively and safety participate.  Jackquline Denmark, MS, OTR/L , CBIS ascom [phone]  07/17/20, 8:45 AM   07/17/2020, 8:44 AM

## 2020-07-17 NOTE — ED Notes (Signed)
This RN unsuccessful at blood collection x2. Lab called to collected blood cultures. MD made aware

## 2020-07-17 NOTE — ED Notes (Signed)
Per MD pt placed on NRB for ativan administration

## 2020-07-17 NOTE — Progress Notes (Signed)
SLP Cancellation Note  Patient Details Name: Brandi Holland MRN: [id number] DOB: [birth year]   Cancelled treatment:       Reason Eval/Treat Not Completed: Patient's level of consciousness;Patient not medically ready;Medical issues which prohibited therapy  Orders received for cognitive-language evaluation. Consulted with RN, who reports pt is poorly responsive and confused at this time, and unable to participate in reliable, valid evaluation.  Will continue efforts.   Zelphia Glover B. Murvin Natal, [facility], CCC-SLP Speech Language Pathologist  Leigh Aurora 07/17/2020, 9:46 AM

## 2020-07-17 NOTE — Sepsis Progress Note (Signed)
Sepsis protocol is being monitored by eLink. 

## 2020-07-17 NOTE — Progress Notes (Signed)
Pharmacy Antibiotic Note  Brandi Holland is a 55 y.o. female admitted on 07/16/2020 with pneumonia and UTI with concern for infection of unclear etiology. Pharmacy has been consulted for Vancomycin and Cefepime dosing.  Plan: Ordered Cefepime 2 gm q24h per indication and current renal fxn.  Vancomycin Pt ordered Loading Dose of Vancomycin 2500 mg.  Vanc AUC calculated dosing interval = q48h dosing.  Due to suspected AKI (SCr = 3.15, baseline 1.2) pharmacy will dose as needed and then order maintenance dosing oncel CrCl stabilizes.  Pharmacy will continue to follow and adjust abx dosing when warranted.    Height: 4\' 9"  (144.8 cm) Weight: 105 kg (231 lb 7.7 oz) IBW/kg (Calculated) : 38.6  Temp (24hrs), Avg:97.5 F (36.4 C), Min:97.5 F (36.4 C), Max:97.5 F (36.4 C)  Recent Labs  Lab 07/16/20 2016 07/17/20 0025  WBC  --  6.8  CREATININE 3.15*  --   LATICACIDVEN 3.2*  --     Estimated Creatinine Clearance: 20.5 mL/min (A) (by C-G formula based on SCr of 3.15 mg/dL (H)).    Allergies  Allergen Reactions  . Biaxin [Clarithromycin]   . Sulfa Antibiotics    Microbiology results: 6/01 BCx: Pending  Thank you for allowing pharmacy to be a part of this patient's care.  8/01, PharmD, [facility] 07/17/2020 1:37 AM

## 2020-07-17 NOTE — ED Notes (Signed)
Consulting civil engineer given ok for family members to switch out. Front desk made aware

## 2020-07-17 NOTE — Progress Notes (Signed)
PHARMACY - PHYSICIAN COMMUNICATION CRITICAL VALUE ALERT - BLOOD CULTURE IDENTIFICATION (BCID)  Brandi Holland is an 55 y.o. female who presented to [facility] on 07/16/2020 with a chief complaint of encephalopathy, weakness  Assessment:  6/1 blood culture (single set) with 1 of 2 bottles GPC, BCID = MSSE.  MRI brain multiple infarcts. TTE pending  Name of physician (or Provider) Contacted: Dr Nelson Chimes  Current antibiotics: Cefepime  Changes to prescribed antibiotics recommended:  Consult ID for possible infective endocarditis with septic emboli  Results for orders placed or performed during the hospital encounter of 07/16/20  Blood Culture ID Panel (Reflexed) (Collected: 07/16/2020  8:07 PM)  Result Value Ref Range   Enterococcus faecalis NOT DETECTED NOT DETECTED   Enterococcus Faecium NOT DETECTED NOT DETECTED   Listeria monocytogenes NOT DETECTED NOT DETECTED   Staphylococcus species DETECTED (A) NOT DETECTED   Staphylococcus aureus (BCID) NOT DETECTED NOT DETECTED   Staphylococcus epidermidis DETECTED (A) NOT DETECTED   Staphylococcus lugdunensis NOT DETECTED NOT DETECTED   Streptococcus species NOT DETECTED NOT DETECTED   Streptococcus agalactiae NOT DETECTED NOT DETECTED   Streptococcus pneumoniae NOT DETECTED NOT DETECTED   Streptococcus pyogenes NOT DETECTED NOT DETECTED   A.calcoaceticus-baumannii NOT DETECTED NOT DETECTED   Bacteroides fragilis NOT DETECTED NOT DETECTED   Enterobacterales NOT DETECTED NOT DETECTED   Enterobacter cloacae complex NOT DETECTED NOT DETECTED   Escherichia coli NOT DETECTED NOT DETECTED   Klebsiella aerogenes NOT DETECTED NOT DETECTED   Klebsiella oxytoca NOT DETECTED NOT DETECTED   Klebsiella pneumoniae NOT DETECTED NOT DETECTED   Proteus species NOT DETECTED NOT DETECTED   Salmonella species NOT DETECTED NOT DETECTED   Serratia marcescens NOT DETECTED NOT DETECTED   Haemophilus influenzae NOT DETECTED NOT DETECTED   Neisseria meningitidis NOT  DETECTED NOT DETECTED   Pseudomonas aeruginosa NOT DETECTED NOT DETECTED   Stenotrophomonas maltophilia NOT DETECTED NOT DETECTED   Candida albicans NOT DETECTED NOT DETECTED   Candida auris NOT DETECTED NOT DETECTED   Candida glabrata NOT DETECTED NOT DETECTED   Candida krusei NOT DETECTED NOT DETECTED   Candida parapsilosis NOT DETECTED NOT DETECTED   Candida tropicalis NOT DETECTED NOT DETECTED   Cryptococcus neoformans/gattii NOT DETECTED NOT DETECTED   Methicillin resistance mecA/C NOT DETECTED NOT DETECTED   Juliette Alcide, PharmD, BCPS.   Work Cell: [phone] 07/17/2020 12:21 PM

## 2020-07-17 NOTE — Consult Note (Signed)
Neurology Consultation  Reason for Consult: Multifocal strokes on MRI, altered mental status Referring Physician: Dr. Nelson Chimes  CC: Altered mental status  History is obtained from: Chart, spouse at bedside  HPI: Brandi Holland is a 55 y.o. female no known past medical history-does not see doctors, presented to the emergency room when she was noticed to be very confused and altered.  The husband at bedside reports that she had been feeling unwell for the past 2 weeks.  She was seen at an outside hospital, was told that there is a lot of fluid buildup, was given medications for removal of the fluid and was back home.  Chart review reveals that she was at one of the [facility] and probably left before the work-up was completed-no echocardiogram was done although CHF was in the top differentials. According to the husband she has been becoming progressively more and more confused and somnolent.  She complains of shortness of breath and pain everywhere. She was brought into the emergency room last night and was noted to have oxygen saturation in the 60s along with wheezing requiring supplemental oxygen.  She was admitted to the hospital for further work-up. She continues to be altered.  Altered mental status work-up included an MRI of the brain that showed multifocal embolic looking infarctions in bilateral cerebral hemispheres. Neurological consultation was obtained for further management from a neurological standpoint.  In the emergency room she was afebrile, systolic blood pressure 90s to 110s, respiratory rate in the 20s, saturating well on 3 L of supplemental oxygen. CMP with hyponatremia, hypochloremia, hyperglycemia and increased BUN/creatinine 73/2.99 Her creatinine was only mildly elevated and may be 1.16  BN P 1768  LKW: Multiple days ago- husband says at least 2 weeks ago tpa given?: no, outside the window Premorbid modified Rankin scale (mRS): Essentially bedridden for the last 2  weeks-making a modified Rankin of 5.  ROS:  Unable to obtain due to altered mental status.   No past medical history on file. According to the H&P: Obstructive sleep apnea, fibromyalgia on past medical history  No family history on file. Unable to provide family history  Social History:   has no history on file for tobacco use, alcohol use, and drug use. Heavy tobacco abuser. . Medications  Current Facility-Administered Medications:  .   stroke: mapping our early stages of recovery book, , Does not apply, Once, Synetta Fail, MD .  0.9 %  sodium chloride infusion, , Intravenous, Continuous, Synetta Fail, MD .  acetaminophen (TYLENOL) tablet 650 mg, 650 mg, Oral, Q6H PRN **OR** acetaminophen (TYLENOL) suppository 650 mg, 650 mg, Rectal, Q6H PRN, Synetta Fail, MD .  bisacodyl (DULCOLAX) suppository 10 mg, 10 mg, Rectal, Daily PRN, Synetta Fail, MD .  ceFEPIme (MAXIPIME) 2 g in sodium chloride 0.9 % 100 mL IVPB, 2 g, Intravenous, Q24H, Belue, Nathan S, RPH .  ipratropium-albuterol (DUONEB) 0.5-2.5 (3) MG/3ML nebulizer solution 3 mL, 3 mL, Nebulization, Q2H PRN, Synetta Fail, MD, 3 mL at 07/17/20 0538 .  LORazepam (ATIVAN) injection 0.5 mg, 0.5 mg, Intravenous, Q6H PRN, Amin, Sumayya, MD .  sodium chloride 0.9 % bolus 1,000 mL, 1,000 mL, Intravenous, Once, Beola Cord B, MD .  sodium chloride flush (NS) 0.9 % injection 3 mL, 3 mL, Intravenous, Q12H, Synetta Fail, MD, 3 mL at 07/17/20 6761  Current Outpatient Medications:  .  clotrimazole (LOTRIMIN) 1 % cream, Apply 1 application topically 2 (two) times daily., Disp: , Rfl:  .  PROAIR HFA 108 (90 Base) MCG/ACT inhaler, Inhale 2 puffs into the lungs every 4 (four) hours as needed., Disp: , Rfl:  .  furosemide (LASIX) 20 MG tablet, Take 20 mg by mouth daily., Disp: , Rfl:    Exam: Current vital signs: BP 100/61   Pulse 77   Temp 98.8 F (37.1 C)   Resp (!) 26   Ht 4\' 9"  (1.448 m)   Wt  105 kg   SpO2 90%   BMI 50.09 kg/m  Vital signs in last 24 hours: Temp:  [97.5 F (36.4 C)-99.6 F (37.6 C)] 98.8 F (37.1 C) (06/02 0909) Pulse Rate:  [77-87] 77 (06/02 1100) Resp:  [15-32] 26 (06/02 1100) BP: (82-113)/(33-97) 100/61 (06/02 1100) SpO2:  [88 %-99 %] 90 % (06/02 1100) Weight:  [105 kg] 105 kg (06/01 2011) General: Extremely drowsy, barely opens eyes to voice HEENT: Normocephalic atraumatic Lungs: Wheezing all over Cardiovascular: Regular rate rhythm Abdomen obese, nontender Extremities warm well perfused with IMA Neurological exam Extremely drowsy, barely opens eyes to voice. Mumbles some words coherently and others incoherently. Speech is dysarthric Does not follow commands Cranial nerves, pupils are equal round reactive to light, extraocular movements appear unhindered, does not blink to threat from either side, face appears symmetric. Motor examination: Grabs onto the examiner's hand and pulls with equal strength in both upper extremities.  Spontaneously moving both lower extremities Sensation: Withdraws all fours to noxious stimulation Coordination difficult to assess given her mentation and wakefulness. Gait testing also deferred due to above NIH stroke scale 1a Level of Conscious.: 2 1b LOC Questions: 0 1c LOC Commands: 0 2 Best Gaze: 0 3 Visual: 0 4 Facial Palsy: 0 5a Motor Arm - left: 0 5b Motor Arm - Right: 0 6a Motor Leg - Left: 0 6b Motor Leg - Right: 0 7 Limb Ataxia: 0 8 Sensory: 0 9 Best Language: 2 10 Dysarthria: 1 11 Extinct. and Inatten.: 0 TOTAL: 5   Labs I have reviewed labs in epic and the results pertinent to this consultation are:  CBC    Component Value Date/Time   WBC 6.0 07/17/2020 0534   RBC 1.87 (L) 07/17/2020 0534   HGB 5.7 (L) 07/17/2020 0534   HGB 12.7 12/03/2013 2107   HCT 18.3 (L) 07/17/2020 0534   HCT 39.7 12/03/2013 2107   PLT 97 (L) 07/17/2020 0534   PLT 202 12/03/2013 2107   MCV 97.9 07/17/2020 0534    MCV 95 12/03/2013 2107   MCH 30.5 07/17/2020 0534   MCHC 31.1 07/17/2020 0534   RDW 22.8 (H) 07/17/2020 0534   RDW 16.4 (H) 12/03/2013 2107    CMP     Component Value Date/Time   NA 130 (L) 07/17/2020 0534   NA 139 12/03/2013 2107   K 4.5 07/17/2020 0534   K 4.0 12/03/2013 2107   CL 96 (L) 07/17/2020 0534   CL 104 12/03/2013 2107   CO2 21 (L) 07/17/2020 0534   CO2 29 12/03/2013 2107   GLUCOSE 148 (H) 07/17/2020 0534   GLUCOSE 108 (H) 12/03/2013 2107   BUN 73 (H) 07/17/2020 0534   BUN 4 (L) 12/03/2013 2107   CREATININE 2.99 (H) 07/17/2020 0534   CREATININE 0.91 12/03/2013 2107   CALCIUM 8.6 (L) 07/17/2020 0534   CALCIUM 8.6 12/03/2013 2107   PROT 6.7 07/17/2020 0534   PROT 7.7 12/03/2013 2107   ALBUMIN 2.7 (L) 07/17/2020 0534   ALBUMIN 3.0 (L) 12/03/2013 2107   AST 25 07/17/2020 0534  AST 15 12/03/2013 2107   ALT 11 07/17/2020 0534   ALT 13 (L) 12/03/2013 2107   ALKPHOS 57 07/17/2020 0534   ALKPHOS 121 (H) 12/03/2013 2107   BILITOT 2.5 (H) 07/17/2020 0534   BILITOT 0.2 12/03/2013 2107   GFRNONAA 18 (L) 07/17/2020 0534   GFRNONAA >60 12/03/2013 2107   GFRNONAA >60 04/23/2013 1756   GFRAA >60 12/03/2013 2107   GFRAA >60 04/23/2013 1756  Blood cultures-1 set with staph species-staph epidermidis. Urinalysis negative  Imaging I have reviewed the images obtained:  CT-scan of the brain multiple areas of hypodensity in both frontal lobes concerning for acute to subacute infarcts  MRI examination of the brain- multifocal bilateral acute ischemia with largest areas in the frontal lobes.  There are lesions in multiple vascular territories bilaterally consistent with a cardiac coronary artery embolic source.  MRA head and neck without contrast: Motion degraded but no large vessel occlusion.  Chest x-ray with cardiomegaly and diffuse interstitial prominence  Assessment:  55 year old with no significant past medical history because she does not see doctors, with about  2-week history of feeling unwell, not being awake for long, acting confused, seen at an outside hospital for fluid overload status with CHF as differentials and no further work-up, with progressive worsening in mentation admitted and evaluated by emergency medicine internal medicine and on work-up for encephalopathy noted to have multifocal embolic appearing infarcts in bilateral cerebral hemispheres. The distribution of the strokes suggest a central/cardiac etiology. Given the story of fluid overload, seems like she has CHF.  Her BNP is elevated. Embolic-looking strokes also raises the possibility of possible endocarditis for which she should be evaluated. Blood cultures- pending-1 bottle with possible staph growth.  Her renal function is also deranged-was barely deranged in May and now creatinine is upwards of 3. Severe anemia requiring transfusions as well.  Current presentation is likely a combination of embolic strokes as well as toxic metabolic encephalopathy.   Impression: Acute ischemic strokes-likely embolic in etiology.  Anemia might also be contributing but the picture is not watershed pattern. Evaluate for underlying cardiac thrombus or infective endocarditis Evaluate for CHF AKI/uremia also contributing to current poor mentation Multifactorial toxic metabolic encephalopathy   Recommendations:  From a stroke standpoint:  Frequent neurochecks  Telemetry  I would hold off on anticoagulation and antiplatelets for now given concern for endocarditis although MRA did not show any evidence of mycotic aneurysms, I want a make sure an echo is completed before we start aspirin.  High intensity statin  No need to allow for permissive hypertension-goal blood pressure should be less than 140/90   From a toxic metabolic encephalopathy standpoint  Check ammonia levels  Management of AKI per primary team  Evaluate for underlying bacterial endocarditis-follow blood cultures  2D  echo-might need TEE  Routine EEG  Supportive medical management including that of AKI, anemia, and CHF are primary team.  Will follow. -- Milon Dikes, MD Neurologist [facility] Pager: [phone]

## 2020-07-17 NOTE — ED Notes (Signed)
MD messaged in regards to pt needing ativan for agitation but oxygen saturations 88-90% on 6L [place]. Waiting for MD instructions at this time

## 2020-07-17 NOTE — ED Notes (Signed)
This RN in room to introduce self. Pt disoriented x4. Pt with labored respirations and remains on 5L [place]. Pt restless. 1:1 sitter present

## 2020-07-17 NOTE — Consult Note (Signed)
NAME:  Brandi Holland, MRN:  [id number], DOB:  05/12/1965, LOS: 0 ADMISSION DATE:  07/16/2020, CONSULTATION DATE:  07/17/20 REFERRING MD:  Dr. Nelson Chimes, CHIEF COMPLAINT:  Altered Mental Status, SOB and Hypoxia  Brief Pt Description / Synopsis:  55 y.o. Female admitted with Acute CVA and Acute Metabolic Encephalopathy possibly due septic emboli from suspected Endocarditis, MSSE Bacteremia, Acute Hypoxic Respiratory Failure in the setting of Pulmonary Edema +/- AECOPD, AKI on CKD 3, severe anemia and thrombocytopenia.  High risk for deterioration and need for intubation.  History of Present Illness:  Brandi Holland is a 55 year old female with a past medical history significant for asthma, OSA, and fibromyalgia who presented to [facility] ED on 07/16/2020 due to complaints of shortness of breath and altered mental status.  Upon presentation she was noted to be hypoxic with O2 saturations in the 60s.  Patient is currently with altered mental status, therefore history is obtained from ED and nursing notes, along with reports per patient's husband.  Per the patient's husband she has not been feeling well for the past 2 weeks, unable to get out of bed, with very poor p.o. intake.  He reports she did have diarrhea, black in color, with some blood noted in the stool last week (approximately 2 days in duration).  She refused to see a doctor, and she continued to deteriorate.  Patient's husband asked her son who lives in [place] to come in, and upon his arrival he called EMS.  Husband denies any knowledge of of recent upper respiratory illnesses, fever, chills, abdominal pain, chest pain, dysuria.  Of note she was recently admitted at [facility] from 06/18/2020 to 06/20/2020 due to lower extremity edema.  She was diuresed, however she left before full work-up could be performed.  No echocardiogram was performed.  CTA chest was negative for PE at that time, and no significant abnormality on labs except for sodium of 122.  Work-up: Patient  remained very altered with intermittent desaturation, ABG with some respiratory alkalosis, no hypoxia.1/2 blood culture bottles with MSSE, MRI brain with multiple bilateral infarcts, worsening renal function so unable to obtain CTA, VQ scan ordered but got canceled due to recurrent desaturation.  D-dimer at 2.6, hemoglobin of 5.7, platelet counts of 97 this morning and there were 255 during recent hospitalization at [facility], BUN of 73 and creatinine of 2.99, sodium of 130, chloride of 96 and bicarb of 21.  Elevated T bili at 2.5 and INR of 1.5, lactic acidosis 3.2>>2.5>>1.9, elevated troponin 708>>904, BNP at 1893, CT chest and abdomen with bilateral groundglass opacities which can be due to pulmonary edema versus atypical pneumonia and no significant abnormality on CT abdomen.  Echocardiogram ordered-pending. Received 1 unit of PRBC with improvement of hemoglobin to 6.6-2 more units ordered.  Patient is being admitted to stepdown unit by the hospitalist for further work-up and treatment of Acute CVA and Acute Metabolic Encephalopathy possibly due septic emboli from suspected Endocarditis, MSSE Bacteremia, Acute Hypoxic Respiratory Failure in the setting of Pulmonary Edema +/- AECOPD, AKI on CKD 3, severe anemia and thrombocytopenia.  High risk for deterioration and need for intubation.  Neurology, cardiology, infectious disease, and PCCM have been consulted.   Pertinent  Medical History  Asthma Obstructive sleep apnea Fibromyalgia  Micro Data:  07/16/2020: SARS-CoV-2 PCR and influenza PCR>> negative 07/16/2020: Blood culture w/ 1 of 2 >> MSSE 07/17/2020: HIV screen>> nonreactive 07/17/2020: Strep pneumo urinary antigen>> 07/17/2020: Legionella urinary antigen>>  Antimicrobials:  Cefepime 6/1>> Vancomycin 6/1>> 6/2  Significant Hospital Events: Including procedures, antibiotic start and stop dates in addition to other pertinent events   . 07/16/2020: Presented to ED with altered mental status and shortness  of breath, MRI with CVA, Neurology consulted . 07/17/2020: 1/2 Blood cultures + for MSSE, concern for infective endocarditis with septic emboli, Cardiology, ID, PCCM consulted. High risk for deterioration.  Interim History / Subjective:  -Patient presented to ED yesterday with altered mental status and acute hypoxic respiratory failure -Found to have bilateral CVAs (concern for embolic in nature) -Today 1 out of 2 blood cultures with MSSA (potential contaminant) -Treating empirically for possible endocarditis -Critically ill, high risk for deterioration and need for intubation -Afebrile, currently hemodynamically stable on no vasopressors -Neurology, cardiology, ID, and PCCM consulted  Objective   Blood pressure 92/60, pulse 77, temperature 98.6 F (37 C), temperature source Axillary, resp. rate (!) 26, height 4\' 9"  (1.448 m), weight 105 kg, SpO2 100 %.        Intake/Output Summary (Last 24 hours) at 07/17/2020 1248 Last data filed at 07/17/2020 1237 Gross per 24 hour  Intake 440 ml  Output --  Net 440 ml   Filed Weights   07/16/20 2011  Weight: 105 kg    Examination: General: Acutely ill-appearing female, sitting in bed, on 10 L HFNC, no acute distress HENT: Atraumatic, normocephalic, neck supple, no JVD Lungs: Coarse breath sounds with mild expiratory wheezing to bilateral bases, even, nonlabored Cardiovascular: Regular rate and rhythm, S1-S2, no murmurs, rubs, gallops Abdomen: Obese, soft, nontender, nondistended, no guarding or rebound tenderness, bowel sounds positive x4 Extremities: Normal bulk and tone, no deformities, no edema Neuro: Lethargic, and moaning, has purposeful movement to all extremities (withdraws from pain), does not follow commands, pupils PERRLA GU: Deferred  Labs/imaging that I havepersonally reviewed  (right click and "Reselect all SmartList Selections" daily)  Labs 07/17/2020: Sodium 130, chloride 96, bicarbonate 21, glucose 148, BUN 73, creatinine 2.99,  anion gap 13, albumin 2.7, BNP 1893, HS troponin 869, osmolality 300, WBC 6.0, procalcitonin 0.34, Hgb 5.7, HCT 18.3, platelets 97, PT 17.6, INR 1.5 Chest x-ray 07/16/2020:Cardiomegaly. Diffuse interstitial prominence could reflect interstitial edema or less likely atypical infection. CT head without contrast 07/16/2020:Areas of low-density in both frontal lobes concerning for acute to subacute infarcts. MRI brain 07/17/2020:1. Multifocal bilateral acute ischemia. The largest areas are in the frontal lobes, but there are lesions in multiple vascular territories bilaterally. This is most consistent with a cardiac or aortic embolic source. 2. No hemorrhage or mass effect. MRA head  07/17/2020: Normal intracranial MRA. MRA neck 07/17/2020:Motion degraded MRA of the neck without visible stenosis or acute abnormality of the carotid or vertebral arteries. Renal ultrasound 07/17/2020>> No evidence of hydronephrosis or other acute renal abnormality  Resolved Hospital Problem list     Assessment & Plan:   Acute CVA & Acute Metabolic Encephalopathy, possibly due septic emboli from suspected Endocarditis -MRI head 07/17/2020 with multifocal bilateral acute ischemia (largest area in the frontal lobes, but lesions in multiple vascular territories bilaterally) most consistent with cardiac/aortic embolic source; No hemorrhagic or mass-effect noted. -MRA head normal, MRA neck without visible stenosis or acute abnormality of the carotid or vertebral arteries -Echocardiogram is currently pending -Frequent neuro checks -Neurology consulted, appreciate input for stroke work up -No antiplatelets at this time due to severe anemia and thrombocytopenia -UDS negative  MSSE Bacteremia (1/2 blood cultures +, ? Contaminant) ? Endocarditis Unable to rule out Multifocal Pneumonia Elevated Troponin in setting of Demand Ischemia vs. NSTEMI -  Monitor fever curve -Trend WBC's & Procalcitonin -Follow cultures as above -Check strep  pneumo and Legionella urinary antigens -ID consulted, appreciate input  -Will continue empiric Cefepime for now pending ID recommendations -Cardiology consulted for possible TTE  ? Endocarditis Elevated Troponin in setting of Demand Ischemia vs. NSTEMI Acute on Chronic CHF (type uncertain at this time as Echo pending) -Continuous cardiac monitoring -Maintain MAP greater than 65 -Vasopressors if needed to maintain MAP goal -Trend lactic acid -Trend at HS troponin until peaked -Cardiology consulted, appreciate input -Diuresis as BP and renal function permits ~ will defer to Cardiology -Echocardiogram currently pending -Consider TEE when medically stable  Acute Hypoxic Respiratory Failure in the setting of Pulmonary Edema +/- AECOPD PMHx of Asthma, OSA, suspected OHS -Supplemental O2 as needed to maintain O2 saturations greater than 92% -Currently tolerating HFNC and protecting airway -High risk for intubation due to AMS -Follow intermittent chest x-ray and ABG as needed -Diuresis as BP and renal function permits ~ will defer to Cardiology -Bronchodilators & Budesonide nebs -Elevated D-dimer, but unable to perform CTA Chest due to AKI -Unable to complete Lung V/Q scan due to episodes of desaturation during scan  Anemia without overt s/sx of bleeding (pt's husband mentioned dark colored stools for 2 days last week, no recurrence at this time) Thrombocytopenia -Monitor for S/Sx of bleeding -Trend CBC -SCD's for VTE Prophylaxis (chemical prophylaxis contraindicated due to severe anemia and thrombocytopenia)  -Transfuse for Hgb <7 -Transfuse platelets for platelet count less than 50 K and active bleeding -Ordered for 2 units pRBC's on 07/17/20 -Peripheral smear currently pending -Hematology consulted, appreciate input  AKI on CKD Stage III (unkown baseline, Cr. 1.2 [facility]) Hyponatremia -Monitor I&O's / urinary output -Follow BMP -Ensure adequate renal  perfusion -Avoid nephrotoxic agents as able -Replace electrolytes as indicated -Renal US is pending ~ negative -Consider Nephrology consult   Pt is critically ill.  High risk for deterioration, cardiac arrest and death.  Prognosis is guarded.   Best practice (right click and "Reselect all SmartList Selections" daily)  Diet:  NPO Pain/Anxiety/Delirium protocol (if indicated): No VAP protocol (if indicated): Not indicated DVT prophylaxis: Contraindicated GI prophylaxis: PPI Glucose control:  SSI No Central venous access:  N/A Arterial line:  N/A Foley:  N/A Mobility:  bed rest  PT consulted: N/A Last date of multidisciplinary goals of care discussion [N/A] Code Status:  full code Disposition: Stepdown  Pt's husband was updated by Dr. Nelson ChimesAmin 07/17/20.  Labs   CBC: Recent Labs  Lab 07/17/20 0025 07/17/20 0534 07/17/20 1227  WBC 6.8 6.0  --   HGB 5.0* 5.7* 6.6*  HCT 16.2* 18.3* 20.9*  MCV 98.8 97.9  --   PLT 101* 97*  --     Basic Metabolic Panel: Recent Labs  Lab 07/16/20 2016 07/17/20 0534  NA 130* 130*  K 4.3 4.5  CL 93* 96*  CO2 22 21*  GLUCOSE 123* 148*  BUN 70* 73*  CREATININE 3.15* 2.99*  CALCIUM 8.8* 8.6*   GFR: Estimated Creatinine Clearance: 21.6 mL/min (A) (by C-G formula based on SCr of 2.99 mg/dL (H)). Recent Labs  Lab 07/16/20 2016 07/17/20 0025 07/17/20 0534 07/17/20 0901  PROCALCITON 0.35  --  0.34  --   WBC  --  6.8 6.0  --   LATICACIDVEN 3.2*  --   --  2.5*    Liver Function Tests: Recent Labs  Lab 07/16/20 2016 07/17/20 0534  AST 30 25  ALT 12 11  ALKPHOS 64 57  BILITOT 2.6* 2.5*  PROT 7.0 6.7  ALBUMIN 2.8* 2.7*   Recent Labs  Lab 07/16/20 2016  LIPASE 32   No results for input(s): AMMONIA in the last 168 hours.  ABG    Component Value Date/Time   HCO3 25.2 07/16/2020 2008   O2SAT 80.2 07/16/2020 2008     Coagulation Profile: Recent Labs  Lab 07/17/20 0018 07/17/20 0534  INR 1.5* 1.5*    Cardiac  Enzymes: Recent Labs  Lab 07/16/20 2016  CKTOTAL 266*    HbA1C: No results found for: HGBA1C  CBG: No results for input(s): GLUCAP in the last 168 hours.  Review of Systems:   Unable to assess due to AMS  Past Medical History:  She,  has no past medical history on file.   Surgical History:  No surgical history on file   Social History:      Family History:  Her family history is not on file.   Allergies Allergies  Allergen Reactions  . Clarithromycin Swelling  . Sulfa Antibiotics Swelling     Home Medications  Prior to Admission medications   Medication Sig Start Date End Date Taking? Authorizing Provider  clotrimazole (LOTRIMIN) 1 % cream Apply 1 application topically 2 (two) times daily. 06/22/20  Yes [provider]  PROAIR HFA 108 (90 Base) MCG/ACT inhaler Inhale 2 puffs into the lungs every 4 (four) hours as needed. 06/22/20  Yes [provider]  furosemide (LASIX) 20 MG tablet Take 20 mg by mouth daily. 06/20/20 07/04/20  [provider]     Critical care time: 50 minutes     Harlon Ditty, AGACNP-BC [facility] Prefer epic messenger for cross cover needs If after hours, please call E-link

## 2020-07-17 NOTE — Consult Note (Addendum)
Cardiology Consultation:   Patient ID: Brandi Holland; [id number]; 01/30/1966   Admit date: 07/16/2020 Date of Consult: 07/17/2020  Primary Care Provider: Pcp, No Primary Cardiologist: [facility] to [facility] - consult by Arida Primary Electrophysiologist:  None   Patient Profile:   Brandi Holland is a 55 y.o. female with a hx of fibromyalgia, peripheral neuropathy, asthma, arthritis, ongoing tobacco use, and OSA who is being seen today for the evaluation of elevated troponin, elevated BNP and acute CVA at the request of Dr. Nelson ChimesAmin.  History of Present Illness:   Brandi Holland was recent admitted to outside hospital in early 06/2020 with a 1 week history of dyspnea and bilateral lower extremity edema felt to be related to new onset CHF with noted elevated BNP of 493.  Symptoms improved with diuresis.  CTA chest was negative for PE.  Lower extremity ultrasound was negative for DVT bilaterally.  Due to patient request that she was discharged prior to completion of echo.  Admission was notable for significant hyponatremia which improved following diuresis.  She resides at home with her husband.  Following her discharge in 06/2020 the husband indicates the patient spent the majority of her time laying in bed, not eating or drinking much, and smoking a fair amount of cigarettes, upwards of 2 packs/day.  He indicates she has smoked 2 packs/day for at least 30 years and prior to this received a significant amount of secondhand smoke.  Between her hospital discharge and presentation this time her husband asked her numerous times to go to the ER for evaluation though she refused.  Ultimately, on 6/1 she fell out of bed and he was unable to get her up.  He indicates her body was "limp."  In this setting he contacted EMS for assistance.  Prior documentation indicates the patient's O2 saturations were in the 60s% on room air in the field.  Upon her arrival to [facility] she was noted to have BP in the 90s to 1 teens systolic with  heart rates in the 80s bpm and required 3 L of supplemental oxygen.  She was afebrile.  Further information was limited secondary to the patient's encephalopathy.  Work up in the ED/since admission has included: - Initial high-sensitivity troponin 708 with a delta and peak of 896, currently downtrending to 846 - BNP 1768 trending to 1893 - Hgb 5.0 (9.8 one-month ago) trending to 6.6, platelet 101 (255 one-month ago), WBC 6.8 - Sodium 130, potassium 4.3, BUN 70, serum creatinine 3.15 (1.16 one-month ago) trending to 2.99, albumin 2.8, total bili 2.6 - Lactic acid 3.3 trending to 1.9 - CK2 66 - D-dimer 2.6 - Urine drug screen negative - COVID-negative - Blood culture growing Staph epidermidis   - CXR: Cardiomegaly with diffuse interstitial prominence possibly reflecting interstitial edema versus less likely atypical infection - CT head: Concerning for acute to subacute frontal lobe infarcts - CT chest/abdomen/pelvis: Cardiomegaly with groundglass opacities noted in the lungs with the left being greater than the right possibly reflecting asymmetric edema versus infection with no acute findings in the abdomen or pelvis and aortic atherosclerosis noted - MRI brain: Multifocal bilateral acute ischemia with the largest areas involving the frontal lobe with lesions in multiple vascular territories bilaterally consistent with cardiac or aortic embolic etiology.  No hemorrhage or mass-effect - MRA head/neck: No visible stenosis or acute abnormality with normal intracranial MRA -VQ scan: Pending    Past Medical History:  Diagnosis Date  . Arthritis   .  Asthma   . Fibromyalgia   . OSA (obstructive sleep apnea)   . Peripheral neuropathy      Home Meds: Prior to Admission medications   Medication Sig Start Date End Date Taking? Authorizing Provider  clotrimazole (LOTRIMIN) 1 % cream Apply 1 application topically 2 (two) times daily. 06/22/20  Yes [provider]  PROAIR HFA 108 (90  Base) MCG/ACT inhaler Inhale 2 puffs into the lungs every 4 (four) hours as needed. 06/22/20  Yes [provider]  furosemide (LASIX) 20 MG tablet Take 20 mg by mouth daily. 06/20/20 07/04/20  [provider]    Inpatient Medications: Scheduled Meds: .  stroke: mapping our early stages of recovery book   Does not apply Once  . sodium chloride flush  3 mL Intravenous Q12H   Continuous Infusions: . sodium chloride    . ceFEPime (MAXIPIME) IV    . sodium chloride     PRN Meds: acetaminophen **OR** acetaminophen, bisacodyl, ipratropium-albuterol, LORazepam  Allergies:   Allergies  Allergen Reactions  . Clarithromycin Swelling  . Sulfa Antibiotics Swelling    Social History:   Social History   Socioeconomic History  . Marital status: Married    Spouse name: Not on file  . Number of children: Not on file  . Years of education: Not on file  . Highest education level: Not on file  Occupational History  . Not on file  Tobacco Use  . Smoking status: Not on file  . Smokeless tobacco: Not on file  Substance and Sexual Activity  . Alcohol use: Not on file  . Drug use: Not on file  . Sexual activity: Not on file  Other Topics Concern  . Not on file  Social History Narrative  . Not on file   Social Determinants of Health   Financial Resource Strain: Not on file  Food Insecurity: Not on file  Transportation Needs: Not on file  Physical Activity: Not on file  Stress: Not on file  Social Connections: Not on file  Intimate Partner Violence: Not on file     Family History:   Family History  Problem Relation Age of Onset  . Chorea Mother   . Emphysema Mother   . Hypertension Mother   . Thyroid disease Mother   . Ulcers Mother   . Dementia Father   . Cancer Father   . Cancer Sister   . Chorea Sister   . Heart disease Sister   . Osteoporosis Sister     ROS:  Review of Systems  Unable to perform ROS: Mental status change      Physical Exam/Data:    Vitals:   07/17/20 1130 07/17/20 1145 07/17/20 1200 07/17/20 1237  BP: (!) 96/51 (!) 102/53 (!) 102/52 92/60  Pulse: 72 72 70 77  Resp: 17 20 (!) 24 (!) 26  Temp:    98.6 F (37 C)  TempSrc:    Axillary  SpO2: 91% 100% 100% 100%  Weight:      Height:        Intake/Output Summary (Last 24 hours) at 07/17/2020 1259 Last data filed at 07/17/2020 1237 Gross per 24 hour  Intake 440 ml  Output --  Net 440 ml   Filed Weights   07/16/20 2011  Weight: 105 kg   Body mass index is 50.09 kg/m.   Physical Exam: General: Chronically ill-appearing Head: Normocephalic, atraumatic, sclera non-icteric, no xanthomas, nares without discharge.  Neck: Negative for carotid bruits. JVD unable to be  assessed secondary to patient body position. Lungs: Coarse and diminished breath sounds bilaterally. Heart: RRR with S1 S2. No murmurs, rubs, or gallops appreciated. Abdomen: Soft, non-tender, non-distended with normoactive bowel sounds. No hepatomegaly. No rebound/guarding. No obvious abdominal masses. Msk:  Strength and tone appear normal for age. Extremities: No clubbing or cyanosis. No edema. Distal pedal pulses are 2+ and equal bilaterally. Neuro: Alert and oriented X 3. No facial asymmetry. No focal deficit. Moves all extremities spontaneously. Psych:  Responds to questions appropriately with a normal affect.   EKG:  The EKG was personally reviewed and demonstrates: NSR, 86 bpm, significant baseline wandering making further interpretation difficult Telemetry:  Telemetry was personally reviewed and demonstrates: SR  Weights: [employer]   07/16/20 2011  Weight: 105 kg    Relevant CV Studies:  2D echo 07/17/2020: Pending  Laboratory Data:  Chemistry Recent Labs  Lab 07/16/20 2016 07/17/20 0534  NA 130* 130*  K 4.3 4.5  CL 93* 96*  CO2 22 21*  GLUCOSE 123* 148*  BUN 70* 73*  CREATININE 3.15* 2.99*  CALCIUM 8.8* 8.6*  GFRNONAA 17* 18*  ANIONGAP 15 13    Recent Labs  Lab  07/16/20 2016 07/17/20 0534  PROT 7.0 6.7  ALBUMIN 2.8* 2.7*  AST 30 25  ALT 12 11  ALKPHOS 64 57  BILITOT 2.6* 2.5*   Hematology Recent Labs  Lab 07/17/20 0025 07/17/20 0534 07/17/20 1227  WBC 6.8 6.0  --   RBC 1.64* 1.87*  --   HGB 5.0* 5.7* 6.6*  HCT 16.2* 18.3* 20.9*  MCV 98.8 97.9  --   MCH 30.5 30.5  --   MCHC 30.9 31.1  --   RDW 22.0* 22.8*  --   PLT 101* 97*  --    Cardiac EnzymesNo results for input(s): TROPONINI in the last 168 hours. No results for input(s): TROPIPOC in the last 168 hours.  BNP Recent Labs  Lab 07/17/20 0025 07/17/20 0534  BNP 1,768.8* 1,893.4*    DDimer  Recent Labs  Lab 07/16/20 2016  DDIMER 2.60*    Radiology/Studies:  CT Head Wo Contrast  Result Date: 07/16/2020 IMPRESSION: Areas of low-density in both frontal lobes concerning for acute to subacute infarcts. Electronically Signed   By: Charlett Nose M.D.   On: 07/16/2020 22:08   MR ANGIO HEAD WO CONTRAST  Result Date: 07/17/2020 IMPRESSION: Normal intracranial MRA. Electronically Signed   By: Deatra Robinson M.D.   On: 07/17/2020 02:39   MR Angiogram Neck W or Wo Contrast  Result Date: 07/17/2020 IMPRESSION: Motion degraded MRA of the neck without visible stenosis or acute abnormality of the carotid or vertebral arteries. Electronically Signed   By: Deatra Robinson M.D.   On: 07/17/2020 03:59   MR BRAIN WO CONTRAST  Result Date: 07/17/2020 IMPRESSION: 1. Multifocal bilateral acute ischemia. The largest areas are in the frontal lobes, but there are lesions in multiple vascular territories bilaterally. This is most consistent with a cardiac or aortic embolic source. 2. No hemorrhage or mass effect. Electronically Signed   By: Deatra Robinson M.D.   On: 07/17/2020 02:33   DG Chest Portable 1 View  Result Date: 07/16/2020 IMPRESSION: Cardiomegaly. Diffuse interstitial prominence could reflect interstitial edema or less likely atypical infection. Electronically Signed   By: Charlett Nose M.D.    On: 07/16/2020 20:41   CT CHEST ABDOMEN PELVIS WO CONTRAST  Result Date: 07/16/2020 IMPRESSION: Cardiomegaly. Mild ground-glass opacities in the lungs, left greater than right. This  could reflect asymmetric edema or infection. No acute findings in the abdomen or pelvis. Aortic atherosclerosis. Electronically Signed   By: Charlett Nose M.D.   On: 07/16/2020 22:13    Assessment and Plan:   1. Acute frontal lobe CVA: -Multiple vascular territories involved concerning for cardiac or aortic embolic etiology -No evidence of A. fib thus far -Unlikely to be a candidate for antiplatelet/anticoagulation therapy at this time with severe symptomatic anemia -Primary service has recommended neurology be consulted -Monitor on telemetry, no evidence of A. fib/flutter in the ED -Will need outpatient cardiac monitoring -Await 2D surface echo -Consider TEE when medically stable, -1 of 2 blood cultures growing staph epidermidis is likely a contaminant with repeat blood cultures pending at this time -A1c 5.5 in 06/2020 with an LDL of 87 at that time -A1c pending, add on direct LDL for further risk stratification  2. Elevated troponin: -Possibly in the setting of acute CVA, versus demand ischemia in the context of acute hypoxic respiratory failure secondary to likely COPD/asthma exacerbation and volume overload, ARF, and severe symptomatic anemia  -Cannot exclude ischemic etiology given multiple significant risk factors including ongoing significant tobacco abuse at 2 packs/day for at least 30 years, morbid obesity, HTN, and HLD -Given recent acute CVA and minimal troponin elevation, will defer initiation of heparin drip -Echo pending -Will need an ischemic evaluation down the road when her severe acute illness is improved and comorbid conditions have been evaluated and adequately treated  3.  Acute on chronic CHF: -Type and etiology uncertain without prior echo available for review -It appears she was  recently admitted to outside hospital in early 06/2020 with acute CHF, type uncertain at that time as well with patient leaving prior to completion of echo -Echo this admission is pending -Will need correction of hypoalbuminemia which is contributing to third spacing prior to initiation of IV diuresis in an effort to preserve renal function -Further recommendations following echo  4.  Acute encephalopathy: -Uncertain if this is related to hypoxic brain injury with notation of O2 saturations in the 60s% at home versus acute CVA versus hyponatremia -Management per primary service  5.  Severe symptomatic anemia: -Uncertain etiology, husband has reported some possible hematochezia -Status post PRBC -Further evaluation and treatment per internal medicine  6.  ARF: -Uncertain etiology, possibly ATN in the setting of dehydration with decreased oral intake versus overdiuresis, though suspect the former given evidence of volume overload upon presentation -Improving with gentle hydration -Avoid nephrotoxic agents -Work-up per internal medicine  7.  Acute hypoxic respiratory distress: -Wean supplemental oxygen as tolerated -Patient has spent extended time periods relatively immobile laying in bed increasing the possibility for DVT/PE -D-dimer was elevated in the ED -Unable to perform CTA chest secondary to ARF -VQ scan pending -Imaging does not fully exclude atypical infection -Etiology likely multifactorial including volume overload, COPD/asthma exacerbation, morbid obesity with untreated OSA and likely OHS exacerbated by symptomatic severe anemia and third spacing with significant hypoalbuminemia  8.  HLD: -LDL of 87 in 06/2020 with goal being less than 70 -Start high intensity statin when able to tolerate oral medication   For questions or updates, please contact [facility] Please consult [email or web address] for contact info under Cardiology/STEMI.   Signed, Eula Listen, PA-C [facility] Pager: [phone] 07/17/2020, 12:59 PM

## 2020-07-17 NOTE — ED Notes (Signed)
Pt transferred to hospital bed at this time.  

## 2020-07-17 NOTE — H&P (Signed)
History and Physical   Brandi Holland DJS:[id number] DOB: 04-14-1965 DOA: 07/16/2020  PCP: Pcp, No   Patient coming from: Home  Chief Complaint: Encephalopathy, weakness, shortness of breath  HPI: Brandi Holland is a 55 y.o. female with medical history significant of asthma, OSA, fibromyalgia who presents via EMS for shortness of breath.  Patient was called out for shortness of breath found to have O2 sats in 65% and wheezing on exam saturating well on 3 L now.  Due to patient's altered mental status history was obtained assistance of chart review (EDP was able to communicate with family but I have been unable to reach them.).  Patient reportedly has been largely confined to her bed for the last couple of weeks.  She has been smoking a lot of cigarettes and not doing much otherwise.  Appears to have decreased p.o. intake.  She also has some increased confusion.  EDP was able to discuss with the patient's son who lives in [place].  He had been talking to her on the phone and she was confused.  He came here from [place] to try to help her out noted that when he tried to move her she was somewhat limp and remained confused.  Also appeared short of breath.  Of note recently admitted for suspected volume overload back in May however no formal echo was ever done because she left for could be done.  But BNP was elevated at that time she is discharged in diuresis is unclear if she has been taking this however she does not appear volume overloaded on exam.  ED Course: Vital signs in the ED significant for temperature of 97.5, blood pressure in the 90s to 110s systolic, heart rate in the 80s, respiratory rate in the 20s saturating well on 3 L.  Lab work-up showed CMP with sodium 130, chloride 93, BUN 70 creatinine 3.1 up from a baseline of 1.2, glucose 123, calcium 8.5 corrects considering albumin of 2.8.  T bili 2.6.  CBC pending.  Lactic acid 3.2 with repeat pending, CK mildly elevated to 66, lipase normal,  D-dimer elevated 2.6, respiratory panel negative for flu and COVID.  Urinalysis and UDS pending.  Blood cultures pending.  VBG with pH 7.43 and PCO2 38.  Procalcitonin elevated 0.35.  Ethanol level negative.  Troponin and BNP also pending.  Imaging work-up included chest x-ray with showed cardiomegaly and possible interstitial prominence representing edema versus atypical pneumonia similar findings were noted on CT chest which showed groundglass opacity concerning for edema versus atypical pneumonia.  CT head showed concern for acute infarct.  Patient received 500 cc bolus and was started on broad-spectrum antibiotics in ED.  MRI was ordered to further evaluate CT findings.  Review of Systems: As per HPI otherwise all other systems reviewed and are negative. Unable to fully evaluate given patient's encephalopathy.  No past medical history on file.  The histories are not reviewed yet. Please review them in the "History" navigator section and refresh this SmartLink. Unable to review given patient's encephalopathy and inability to get in touch with family  Social History  has no history on file for tobacco use, alcohol use, and drug use.  Allergies  Allergen Reactions  . Biaxin [Clarithromycin]   . Sulfa Antibiotics     No family history on file. Unable to be obtained due to patient's altered mental status  Prior to Admission medications   Not on File  Prior to admission medications per chart review: Albuterol Phenergan Lasix (  for 14 days after discharge on 5/9).  Physical Exam: Vitals:   07/16/20 2245 07/16/20 2300 07/16/20 2315 07/16/20 2330  BP: (!) 98/51 105/64 (!) 113/58 (!) 112/54  Pulse: 83 84 83 85  Resp:  20 18   Temp:      SpO2: (!) 89% 94% 95% 93%  Weight:      Height:       Physical Exam Constitutional:      General: She is not in acute distress.    Appearance: Normal appearance. She is obese.  HENT:     Head: Normocephalic and atraumatic.     Mouth/Throat:      Mouth: Mucous membranes are moist.     Pharynx: Oropharynx is clear.  Eyes:     Extraocular Movements: Extraocular movements intact.     Pupils: Pupils are equal, round, and reactive to light.  Cardiovascular:     Rate and Rhythm: Normal rate and regular rhythm.     Pulses: Normal pulses.     Heart sounds: Normal heart sounds.  Pulmonary:     Effort: Pulmonary effort is normal. No respiratory distress.     Breath sounds: Normal breath sounds.     Comments: Questionable trace wheezing, but does have increased upper airway sounds. Abdominal:     General: Bowel sounds are normal. There is no distension.     Palpations: Abdomen is soft.     Tenderness: There is no abdominal tenderness.  Musculoskeletal:        General: No swelling or deformity.  Skin:    General: Skin is warm and dry.  Neurological:     Comments: Alert and disoriented Strength and sensation is grossly intact upper and lower extremities however having difficulty getting patient to fully follow commands. EOMI intact, hearing intact, no abnormality of speech.    Labs on Admission: I have personally reviewed following labs and imaging studies  CBC: No results for input(s): WBC, NEUTROABS, HGB, HCT, MCV, PLT in the last 168 hours.  Basic Metabolic Panel: Recent Labs  Lab 07/16/20 2016  NA 130*  K 4.3  CL 93*  CO2 22  GLUCOSE 123*  BUN 70*  CREATININE 3.15*  CALCIUM 8.8*    GFR: Estimated Creatinine Clearance: 20.5 mL/min (A) (by C-G formula based on SCr of 3.15 mg/dL (H)).  Liver Function Tests: Recent Labs  Lab 07/16/20 2016  AST 30  ALT 12  ALKPHOS 64  BILITOT 2.6*  PROT 7.0  ALBUMIN 2.8*    Urine analysis:    Component Value Date/Time   COLORURINE Straw 12/03/2013 2107   APPEARANCEUR Clear 12/03/2013 2107   LABSPEC 1.003 12/03/2013 2107   PHURINE 6.0 12/03/2013 2107   GLUCOSEU Negative 12/03/2013 2107   HGBUR Negative 12/03/2013 2107   BILIRUBINUR Negative 12/03/2013 2107   KETONESUR  Negative 12/03/2013 2107   PROTEINUR Negative 12/03/2013 2107   NITRITE Negative 12/03/2013 2107   LEUKOCYTESUR Negative 12/03/2013 2107    Radiological Exams on Admission: CT Head Wo Contrast  Result Date: 07/16/2020 CLINICAL DATA:  Dizziness EXAM: CT HEAD WITHOUT CONTRAST TECHNIQUE: Contiguous axial images were obtained from the base of the skull through the vertex without intravenous contrast. COMPARISON:  None. FINDINGS: Brain: Areas of low-density noted in both frontal lobes concerning for acute to subacute infarcts. No hemorrhage or hydrocephalus. Vascular: No hyperdense vessel or unexpected calcification. Skull: No acute calvarial abnormality. Sinuses/Orbits: No acute findings Other: None IMPRESSION: Areas of low-density in both frontal lobes concerning for acute to subacute  infarcts. Electronically Signed   By: Charlett Nose M.D.   On: 07/16/2020 22:08   DG Chest Portable 1 View  Result Date: 07/16/2020 CLINICAL DATA:  Shortness of breath EXAM: PORTABLE CHEST 1 VIEW COMPARISON:  None. FINDINGS: Low lung volumes. Mild cardiomegaly. Diffuse interstitial prominence throughout the lungs. No effusions or pneumothorax. No acute bony abnormality. IMPRESSION: Cardiomegaly. Diffuse interstitial prominence could reflect interstitial edema or less likely atypical infection. Electronically Signed   By: Charlett Nose M.D.   On: 07/16/2020 20:41   CT CHEST ABDOMEN PELVIS WO CONTRAST  Result Date: 07/16/2020 CLINICAL DATA:  Shortness of breath. EXAM: CT CHEST, ABDOMEN AND PELVIS WITHOUT CONTRAST TECHNIQUE: Multidetector CT imaging of the chest, abdomen and pelvis was performed following the standard protocol without IV contrast. COMPARISON:  None. FINDINGS: CT CHEST FINDINGS Cardiovascular: Heart is mildly enlarged.  Aorta normal caliber. Mediastinum/Nodes: No mediastinal, hilar, or axillary adenopathy. Trachea and esophagus are unremarkable. Thyroid unremarkable. Lungs/Pleura: Ground-glass airspace opacities  within the upper lobes bilaterally, left greater than right as well as left lower lobe. No effusions or pneumothorax. Musculoskeletal: Chest wall soft tissues are unremarkable. No acute bony abnormality. CT ABDOMEN PELVIS FINDINGS Hepatobiliary: No focal liver abnormality is seen. Status post cholecystectomy. No biliary dilatation. Pancreas: No focal abnormality or ductal dilatation. Spleen: No focal abnormality.  Normal size. Adrenals/Urinary Tract: Fullness of the left adrenal gland compatible with hyperplasia. Right adrenal gland and kidneys unremarkable. No stones or hydronephrosis. Stomach/Bowel: Stomach, large and small bowel grossly unremarkable. Vascular/Lymphatic: Aortic atherosclerosis. No evidence of aneurysm or adenopathy. Reproductive: Prior hysterectomy.  No adnexal masses. Other: No free fluid or free air. Musculoskeletal: No acute bony abnormality. IMPRESSION: Cardiomegaly. Mild ground-glass opacities in the lungs, left greater than right. This could reflect asymmetric edema or infection. No acute findings in the abdomen or pelvis. Aortic atherosclerosis. Electronically Signed   By: Charlett Nose M.D.   On: 07/16/2020 22:13   EKG: Independently reviewed.  Sinus rhythm at 86 bpm.  Baseline wander.  Assessment/Plan Principal Problem:   Acute encephalopathy Active Problems:   CVA (cerebral vascular accident) (HCC)   Asthma   Acute on chronic respiratory failure with hypoxia (HCC)   Acute renal failure superimposed on stage 3a chronic kidney disease (HCC)   Hyponatremia   OSA (obstructive sleep apnea)  CVA Acute encephalopathy > Patient presenting with several weeks of confusion > Found to be hypoxic on EMS evaluation and noted to have evidence of infarct on CT head > Etiology of encephalopathy is CVA versus hypoxic injury versus toxic vs overocrrection of Sodium at recent admission?. > CBC has not yet returned however does have elevated lactic acid this could be in the setting of her  spending a lot of time in bed given CK also elevated is being covered with broad-spectrum antibiotics - Monitor on progressive unit - Continue with broad-spectrum antibiotics for now - Neurology consult in AM - Allow for permissive HTN (systolic < 220 and diastolic < 120) - ASA pending size of infarct - Statin when tolerating p.o. - Echocardiogram  - Follow-up MRI and MRA head & neck  - A1C  - Lipid panel  - Tele monitoring  - SLP eval - PT/OT  Asthma Acute hypoxic respiratory failure > Unclear etiology this time possibly pneumonia given is on the differential for imaging  > Could be related to asthma exacerbation > PE seems less likely however did have elevated D-dimer unable to get CT angio in the ED due to elevated creatinine, can  consider VQ scan.  Consider starting anticoagulation pending MRI results. > Unclear what role CVA could be having in this - Saturating well on 3 L, will continue with oxygen as needed - Continue with broad-spectrum antibiotics for now as above - Trend fever curve and white blood cell count - DuoNebs as needed.  AKI on CKD 3A Hyponatremia > Creatinine elevated to 3.1 from baseline around 1.2. > Sodium 138 which is up from 122 4 weeks ago, this appears to be in the setting of decreased p.o. intake versus Lasix use - Received 1.5 L in the ED - Avoid nephrotoxic agents - Continue with IV fluids - Trend renal function and electrolytes  ?Infection > Awaiting CBC to evaluate for leukocytosis as initial labs were diluted. > Urinalysis pending chest x-ray and CT with questionable pneumonia versus pulmonary edema however does not appear volume related on exam and BNP  > May be aspirating in the setting of confusion/stroke > Procalcitonin elevated to 0.35 with lactic acid of 3.2 and repeat pending.  Lactic acid elevation may be due to spending a lot of time in bed as CK is also mildly elevated at 266. - N.p.o. - Continue broad-spectrum antibiotics as above  for now  History of volume overload > Recent admission for volume overload with elevated BNP, did not get echo at that time - Echocardiogram - I/O, daily weights, monitor fluid status  History of OSA - We will order a trial of CPAP to see if patient tolerates, will need to remain n.p.o.  DVT prophylaxis: SCDs for now, provided no large acute CVA on MRI, will consider heparin as we await VQ scan. Code Status:   Full Family Communication:  Attempted to contact significant other however there was no answer Disposition Plan:   Patient is from:  Home  Anticipated DC to:  Home  Anticipated DC date:  2 to 7 days  Anticipated DC barriers: None  Consults called:  None at the time of admission. Admission status:  Inpatient, progressive  Severity of Illness: The appropriate patient status for this patient is INPATIENT. Inpatient status is judged to be reasonable and necessary in order to provide the required intensity of service to ensure the patient's safety. The patient's presenting symptoms, physical exam findings, and initial radiographic and laboratory data in the context of their chronic comorbidities is felt to place them at high risk for further clinical deterioration. Furthermore, it is not anticipated that the patient will be medically stable for discharge from the hospital within 2 midnights of admission. The following factors support the patient status of inpatient.   " The patient's presenting symptoms include weakness, confusion, decreased p.o. intake. " The worrisome physical exam findings include weakness, confusion, some difficulty with commands. " The initial radiographic and laboratory data are worrisome because of sodium 130, chloride 93, BUN 70, creatinine 3.1, glucose 123, calcium 8.5, albumin 2.8, T bili 2.6, lactic acid 3.2, CK2 66, D-dimer 2.6, VBG 7.3 pH, 38 PCO2.  Procalcitonin 0.35.  Chest x-ray with increased interstitial prominence edema versus atypical pneumonia with  similar findings on CT.  CT head with concern for infarct.. " The chronic co-morbidities include asthma, OSA, fibromyalgia, tobacco use.   * I certify that at the point of admission it is my clinical judgment that the patient will require inpatient hospital care spanning beyond 2 midnights from the point of admission due to high intensity of service, high risk for further deterioration and high frequency of surveillance required.Lyn Hollingshead*   Ronee Ranganathan  Elgie Collard MD [facility]  How to contact the [facility] Attending or Consulting provider 7A - 7P or covering provider during after hours 7P -7A, for this patient?   1. Check the care team in [facility] and look for a) attending/consulting [facility] provider listed and b) the [facility] team listed 2. Log into [email or web address] and use [facility] [place]'s universal password to access. If you do not have the password, please contact the hospital operator. 3. Locate the [facility] provider you are looking for under [facility] and page to a number that you can be directly reached. 4. If you still have difficulty reaching the provider, please page the [facility] (Director on Call) for the Hospitalists listed on amion for assistance.  07/17/2020, 1:03 AM

## 2020-07-17 NOTE — ED Notes (Signed)
Pt transported to nuclear medicine. 

## 2020-07-17 NOTE — Consult Note (Signed)
NAME: Brandi Holland  DOB: 04-16-1965  MRN: [id number]  Date/Time: 07/17/2020 1:27 PM  REQUESTING PROVIDER: Dr. Nelson Chimes Subjective:  REASON FOR CONSULT: Endocarditis ?No history available from patient- spoke to her husband who is a poor historian Brandi Holland is a 55 y.o. female.  No history available from her as she is got altered mental status Patient was recently at [facility] for congestive heart failure and possible pneumonia between 5/6/06/22/20. As per her husband she has been deteriorating the past 2 weeks- always in bed, fatigued, poor appetite. Sob- was refusing to go to hospital. Patient was brought in yesterday by EMS a for shortness of breath.  She was found to have altered mental status.  Her oxygen sats were 65%.   Vitals in the ED BP 105/64, pulse rate 84, temperature 97.5, respiratory rate 20. Labs showed sodium of 130, creatinine 3.25, bilirubin 2.6, CK2 66, troponin 708, Pro-Cal 0.34, LDH 283, BNP 1768, Hb 5.0, platelet 101, WBC 6.8, Blood cultures were sent Chest x-ray revealed cardiomegaly with diffuse interstitial prominence reflecting interstitial edema. Past Medical History:  Diagnosis Date  . Arthritis   . Asthma   . Fibromyalgia   . OSA (obstructive sleep apnea)   . Peripheral neuropathy      Social History   Socioeconomic History  . Marital status: Married    Spouse name: Not on file  . Number of children: Not on file  . Years of education: Not on file  . Highest education level: Not on file  Occupational History  . Not on file  Tobacco Use  . Smoking status: Not on file  . Smokeless tobacco: Not on file  Substance and Sexual Activity  . Alcohol use: Not on file  . Drug use: Not on file  . Sexual activity: Not on file  Other Topics Concern  . Not on file  Social History Narrative  . Not on file   Social Determinants of Health   Financial Resource Strain: Not on file  Food Insecurity: Not on file  Transportation Needs: Not on file  Physical Activity:  Not on file  Stress: Not on file  Social Connections: Not on file  Intimate Partner Violence: Not on file    Family History  Problem Relation Age of Onset  . Chorea Mother   . Emphysema Mother   . Hypertension Mother   . Thyroid disease Mother   . Ulcers Mother   . Dementia Father   . Cancer Father   . Cancer Sister   . Chorea Sister   . Heart disease Sister   . Osteoporosis Sister    Allergies  Allergen Reactions  . Clarithromycin Swelling  . Sulfa Antibiotics Swelling   I? Current Facility-Administered Medications  Medication Dose Route Frequency Provider Last Rate Last Admin  .  stroke: mapping our early stages of recovery book   Does not apply Once Synetta Fail, MD      . 0.9 %  sodium chloride infusion   Intravenous Continuous Synetta Fail, MD      . acetaminophen (TYLENOL) tablet 650 mg  650 mg Oral Q6H PRN Synetta Fail, MD       Or  . acetaminophen (TYLENOL) suppository 650 mg  650 mg Rectal Q6H PRN Synetta Fail, MD      . bisacodyl (DULCOLAX) suppository 10 mg  10 mg Rectal Daily PRN Synetta Fail, MD      . ipratropium-albuterol (DUONEB) 0.5-2.5 (3) MG/3ML nebulizer solution 3  mL  3 mL Nebulization Q2H PRN Synetta Fail, MD   3 mL at 07/17/20 0538  . LORazepam (ATIVAN) injection 0.5 mg  0.5 mg Intravenous Q6H PRN Arnetha Courser, MD   0.5 mg at 07/17/20 1138  . sodium chloride 0.9 % bolus 1,000 mL  1,000 mL Intravenous Once Synetta Fail, MD      . sodium chloride flush (NS) 0.9 % injection 3 mL  3 mL Intravenous Q12H Synetta Fail, MD   3 mL at 07/17/20 1610   Current Outpatient Medications  Medication Sig Dispense Refill  . clotrimazole (LOTRIMIN) 1 % cream Apply 1 application topically 2 (two) times daily.    Marland Kitchen PROAIR HFA 108 (90 Base) MCG/ACT inhaler Inhale 2 puffs into the lungs every 4 (four) hours as needed.    . furosemide (LASIX) 20 MG tablet Take 20 mg by mouth daily.       Abtx:  Anti-infectives (From  admission, onward)   Start     Dose/Rate Route Frequency Ordered Stop   07/17/20 2300  ceFEPIme (MAXIPIME) 2 g in sodium chloride 0.9 % 100 mL IVPB  Status:  Discontinued        2 g 200 mL/hr over 30 Minutes Intravenous Every 24 hours 07/17/20 0138 07/17/20 1307   07/17/20 0138  vancomycin variable dose per unstable renal function (pharmacist dosing)  Status:  Discontinued         Does not apply See admin instructions 07/17/20 0138 07/17/20 0802   07/17/20 0000  vancomycin (VANCOREADY) IVPB 1500 mg/300 mL       "Followed by" Linked Group Details   1,500 mg 150 mL/hr over 120 Minutes Intravenous  Once 07/16/20 2249 07/17/20 0535   07/16/20 2300  vancomycin (VANCOCIN) IVPB 1000 mg/200 mL premix       "Followed by" Linked Group Details   1,000 mg 200 mL/hr over 60 Minutes Intravenous  Once 07/16/20 2249 07/17/20 0323   07/16/20 2245  vancomycin (VANCOCIN) IVPB 1000 mg/200 mL premix  Status:  Discontinued        1,000 mg 200 mL/hr over 60 Minutes Intravenous  Once 07/16/20 2233 07/17/20 0300   07/16/20 2245  ceFEPIme (MAXIPIME) 2 g in sodium chloride 0.9 % 100 mL IVPB        2 g 200 mL/hr over 30 Minutes Intravenous  Once 07/16/20 2233 07/17/20 0055      REVIEW OF SYSTEMS:  Const: negative fever, negative chills, negative weight loss Eyes: negative diplopia or visual changes, negative eye pain ENT: negative coryza, negative sore throat Resp: negative cough, hemoptysis, dyspnea Cards: negative for chest pain, palpitations, lower extremity edema GU: negative for frequency, dysuria and hematuria GI: Negative for abdominal pain, diarrhea, bleeding, constipation Skin: negative for rash and pruritus Heme: negative for easy bruising and gum/nose bleeding MS: negative for myalgias, arthralgias, back pain and muscle weakness Neurolo:negative for headaches, dizziness, vertigo, memory problems  Psych: negative for feelings of anxiety, depression  Endocrine: negative for thyroid,  diabetes Allergy/Immunology- negative for any medication or food allergies ? Pertinent Positives include : Objective:  VITALS:  BP (!) 104/52   Pulse 78   Temp 98.6 F (37 C) (Axillary)   Resp (!) 21   Ht 4\' 9"  (1.448 m)   Wt 105 kg   SpO2 (!) 86%   BMI 50.09 kg/m  PHYSICAL EXAM:  General: Alert, cooperative, no distress, appears stated age.  Head: Normocephalic, without obvious abnormality, atraumatic. Eyes: Conjunctivae clear, anicteric sclerae. Pupils  are equal ENT Nares normal. No drainage or sinus tenderness. Lips, mucosa, and tongue normal. No Thrush Neck: Supple, symmetrical, no adenopathy, thyroid: non tender no carotid bruit and no JVD. Back: No CVA tenderness. Lungs: Clear to auscultation bilaterally. No Wheezing or Rhonchi. No rales. Heart: Regular rate and rhythm, no murmur, rub or gallop. Abdomen: Soft, non-tender,not distended. Bowel sounds normal. No masses Extremities: atraumatic, no cyanosis. No edema. No clubbing Skin: No rashes or lesions. Or bruising Lymph: Cervical, supraclavicular normal. Neurologic: Grossly non-focal Pertinent Labs Lab Results CBC    Component Value Date/Time   WBC 6.0 07/17/2020 0534   RBC 1.87 (L) 07/17/2020 0534   HGB 6.6 (L) 07/17/2020 1227   HGB 12.7 12/03/2013 2107   HCT 20.9 (L) 07/17/2020 1227   HCT 39.7 12/03/2013 2107   PLT 97 (L) 07/17/2020 0534   PLT 202 12/03/2013 2107   MCV 97.9 07/17/2020 0534   MCV 95 12/03/2013 2107   MCH 30.5 07/17/2020 0534   MCHC 31.1 07/17/2020 0534   RDW 22.8 (H) 07/17/2020 0534   RDW 16.4 (H) 12/03/2013 2107    CMP Latest Ref Rng & Units 07/17/2020 07/16/2020 06/18/2020  Glucose 70 - 99 mg/dL 671(I) 458(K) 998(P)  BUN 6 - 20 mg/dL 38(S) 50(N) 19  Creatinine 0.44 - 1.00 mg/dL 3.97(Q) 7.34(L) 9.37(T)  Sodium 135 - 145 mmol/L 130(L) 130(L) 122(L)  Potassium 3.5 - 5.1 mmol/L 4.5 4.3 3.9  Chloride 98 - 111 mmol/L 96(L) 93(L) 90(L)  CO2 22 - 32 mmol/L 21(L) 22 22  Calcium 8.9 - 10.3 mg/dL  0.2(I) 0.9(B) 3.5(H)  Total Protein 6.5 - 8.1 g/dL 6.7 7.0 -  Total Bilirubin 0.3 - 1.2 mg/dL 2.5(H) 2.6(H) -  Alkaline Phos 38 - 126 U/L 57 64 -  AST 15 - 41 U/L 25 30 -  ALT 0 - 44 U/L 11 12 -      Microbiology: Recent Results (from the past 240 hour(s))  Blood culture (single)     Status: None (Preliminary result)   Collection Time: 07/16/20  8:07 PM   Specimen: Left Antecubital; Blood  Result Value Ref Range Status   Specimen Description LEFT ANTECUBITAL  Final   Special Requests   Final    BOTTLES DRAWN AEROBIC AND ANAEROBIC Blood Culture results may not be optimal due to an inadequate volume of blood received in culture bottles   Culture  Setup Time   Final    GRAM POSITIVE COCCI Organism ID to follow ANAEROBIC BOTTLE ONLY CRITICAL RESULT CALLED TO, READ BACK BY AND VERIFIED WITH: DEVIN MITCHELL 1200 07/17/2020 DLB Performed at [facility], [address] [place], [place] [address]    Culture GRAM POSITIVE COCCI  Final   Report Status PENDING  Incomplete  Blood Culture ID Panel (Reflexed)     Status: Abnormal   Collection Time: 07/16/20  8:07 PM  Result Value Ref Range Status   Enterococcus faecalis NOT DETECTED NOT DETECTED Final   Enterococcus Faecium NOT DETECTED NOT DETECTED Final   Listeria monocytogenes NOT DETECTED NOT DETECTED Final   Staphylococcus species DETECTED (A) NOT DETECTED Final    Comment: CRITICAL RESULT CALLED TO, READ BACK BY AND VERIFIED WITH: DEVIN MITCHELL 1200 07/17/2020 DLB    Staphylococcus aureus (BCID) NOT DETECTED NOT DETECTED Final   Staphylococcus epidermidis DETECTED (A) NOT DETECTED Final    Comment: CRITICAL RESULT CALLED TO, READ BACK BY AND VERIFIED WITH: DEVIN MITCHELL 1200 07/17/2020 DLB    Staphylococcus lugdunensis NOT DETECTED NOT  DETECTED Final   Streptococcus species NOT DETECTED NOT DETECTED Final   Streptococcus agalactiae NOT DETECTED NOT DETECTED Final   Streptococcus pneumoniae NOT DETECTED NOT DETECTED Final    Streptococcus pyogenes NOT DETECTED NOT DETECTED Final   A.calcoaceticus-baumannii NOT DETECTED NOT DETECTED Final   Bacteroides fragilis NOT DETECTED NOT DETECTED Final   Enterobacterales NOT DETECTED NOT DETECTED Final   Enterobacter cloacae complex NOT DETECTED NOT DETECTED Final   Escherichia coli NOT DETECTED NOT DETECTED Final   Klebsiella aerogenes NOT DETECTED NOT DETECTED Final   Klebsiella oxytoca NOT DETECTED NOT DETECTED Final   Klebsiella pneumoniae NOT DETECTED NOT DETECTED Final   Proteus species NOT DETECTED NOT DETECTED Final   Salmonella species NOT DETECTED NOT DETECTED Final   Serratia marcescens NOT DETECTED NOT DETECTED Final   Haemophilus influenzae NOT DETECTED NOT DETECTED Final   Neisseria meningitidis NOT DETECTED NOT DETECTED Final   Pseudomonas aeruginosa NOT DETECTED NOT DETECTED Final   Stenotrophomonas maltophilia NOT DETECTED NOT DETECTED Final   Candida albicans NOT DETECTED NOT DETECTED Final   Candida auris NOT DETECTED NOT DETECTED Final   Candida glabrata NOT DETECTED NOT DETECTED Final   Candida krusei NOT DETECTED NOT DETECTED Final   Candida parapsilosis NOT DETECTED NOT DETECTED Final   Candida tropicalis NOT DETECTED NOT DETECTED Final   Cryptococcus neoformans/gattii NOT DETECTED NOT DETECTED Final   Methicillin resistance mecA/C NOT DETECTED NOT DETECTED Final    Comment: Performed at [facility], [address] [place], [place] [address]  Resp Panel by RT-PCR (Flu A&B, Covid) Nasopharyngeal Swab     Status: None   Collection Time: 07/16/20  8:16 PM   Specimen: Nasopharyngeal Swab; Nasopharyngeal(NP) swabs in vial transport medium  Result Value Ref Range Status   SARS Coronavirus 2 by RT PCR NEGATIVE NEGATIVE Final    Comment: (NOTE) SARS-CoV-2 target nucleic acids are NOT DETECTED.  The SARS-CoV-2 RNA is generally detectable in upper respiratory specimens during the acute phase of infection. The lowest concentration of  SARS-CoV-2 viral copies this assay can detect is 138 copies/mL. A negative result does not preclude SARS-Cov-2 infection and should not be used as the sole basis for treatment or other patient management decisions. A negative result may occur with  improper specimen collection/handling, submission of specimen other than nasopharyngeal swab, presence of viral mutation(s) within the areas targeted by this assay, and inadequate number of viral copies(<138 copies/mL). A negative result must be combined with clinical observations, patient history, and epidemiological information. The expected result is Negative.  Fact Sheet for Patients:  BloggerCourse.comhttps://www.fda.gov/media/152166/download  Fact Sheet for Healthcare Providers:  SeriousBroker.ithttps://www.fda.gov/media/152162/download  This test is no t yet approved or cleared by the [place] FDA and  has been authorized for detection and/or diagnosis of SARS-CoV-2 by FDA under an Emergency Use Authorization (EUA). This EUA will remain  in effect (meaning this test can be used) for the duration of the COVID-19 declaration under Section 564(b)(1) of the Act, 21 U.S.C.section 360bbb-3(b)(1), unless the authorization is terminated  or revoked sooner.       Influenza A by PCR NEGATIVE NEGATIVE Final   Influenza B by PCR NEGATIVE NEGATIVE Final    Comment: (NOTE) The Xpert Xpress SARS-CoV-2/FLU/RSV plus assay is intended as an aid in the diagnosis of influenza from Nasopharyngeal swab specimens and should not be used as a sole basis for treatment. Nasal washings and aspirates are unacceptable for Xpert Xpress SARS-CoV-2/FLU/RSV testing.  Fact Sheet for Patients: BloggerCourse.comhttps://www.fda.gov/media/152166/download  Fact  Sheet for Healthcare Providers: SeriousBroker.it  This test is not yet approved or cleared by the [place] and has been authorized for detection and/or diagnosis of SARS-CoV-2 by FDA under an Emergency Use  Authorization (EUA). This EUA will remain in effect (meaning this test can be used) for the duration of the COVID-19 declaration under Section 564(b)(1) of the Act, 21 U.S.C. section 360bbb-3(b)(1), unless the authorization is terminated or revoked.  Performed at [facility], [address] [place], [place] [address]     IMAGING RESULTS:  I have personally reviewed the films ? Impression/Recommendation ? 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  Will need to get a 2D echo and an TEE  Staph epidermidis in 1 of 2 bottles is very likely  a contaminant but with her above picture endocarditis will be kept in mind.  But she does not have any prosthetic heart valve or device. Will add cefazolin   Severe anemia Thrombocytopenia  Hyperbilirubinemia Increased LDH 0.2 hemolysis-like picture.  Need to rule out TTP as a creatinine is also high.  AKI  ? ___________________________________________________ Discussed with husband , requesting provider Note:  This document was prepared using Dragon voice recognition software and may include unintentional dictation errors.

## 2020-07-17 NOTE — Sepsis Progress Note (Signed)
Spoke with bedside nurse regarding lactic acid. It looks like it was d/c'd. Nurse is going to send another LA.

## 2020-07-17 NOTE — ED Notes (Addendum)
Pt found to have ripped out left arm IV. BP cuff and gown changed. Mitts placed on pt. Tracey NT sitting 1 on 1 with pt.

## 2020-07-17 NOTE — Progress Notes (Addendum)
PROGRESS NOTE    Brandi Holland  ZOX:[id number] DOB: 1965/09/14 DOA: 07/16/2020 PCP: Pcp, No   Brief Narrative: Taken from H&P.  Brandi Holland is a 55 y.o. female with medical history significant of asthma, OSA, fibromyalgia who presents via EMS for shortness of breath.  She was hypoxic in 60s requiring 5 to 6 L of oxygen with intermittent nonrebreather. Initially admitting provider unable to obtained any history as there was no family member at bedside, no one could be reached on phone and patient was very altered.  Able to get hold of husband after multiple attempts at bedside, according to him patient was not feeling well for the past few weeks, she was admitted at [facility] on 06/18/2020 with lower extremity edema and was diuresed and discharged on 06/20/2020.  No echocardiogram done, CTA was negative for PE at that time, no significant abnormality on labs except sodium of 122 which improved to 130 and mild AKI, unknown baseline. Patient continued to feel bad and she was staying in bed for the past more than 2 weeks, unable to get out of bed, very poor p.o. intake.  Per husband she did had diarrhea with black color stool and he has seen some blood in stool last week, diarrhea lasted for 2 days and then resolved.  He did not noticed any hematemesis.  Patient does not drink alcohol but is a heavy smoker, smokes at least 2 packs/day.  Husband was able to brought her to ED 2 weeks ago and then she left after waiting couple of hours without being seen.  Per husband she was refusing to go see a doctor, when she continued to deteriorate he asked her son who lives in [place] who drove from there and talked with her and called the EMS. Husband denies any recent upper respiratory illnesses, fever or chills.  He did noticed worsening shortness of breath.  Patient was mostly bedbound for the past 2 weeks, he was using diapers to help her as she was unable to go to the bathroom by herself due to profound  weakness.  Patient remained very altered with intermittent desaturation, ABG with some respiratory alkalosis, no hypoxia.1/2 blood culture bottles with MSSE, MRI brain with multiple bilateral infarcts, worsening renal function so unable to obtain CTA, VQ scan ordered but got canceled due to recurrent desaturation.  D-dimer at 2.6, hemoglobin of 5.7, platelet counts of 97 this morning and there were 255 during recent hospitalization at [facility], BUN of 73 and creatinine of 2.99, sodium of 130, chloride of 96 and bicarb of 21.  Elevated T bili at 2.5 and INR of 1.5, lactic acidosis 3.2>>2.5>>1.9, elevated troponin 708>>904, BNP at 1893, CT chest and abdomen with bilateral groundglass opacities which can be due to pulmonary edema versus atypical pneumonia and no significant abnormality on CT abdomen.  Echocardiogram ordered-pending. Received 1 unit of PRBC with improvement of hemoglobin to 6.6-2 more units ordered.  Subjective: Patient remains very lethargic, drowsy, appears little agitated with thrashing her legs, unable to follow any commands.  Assessment & Plan:   Principal Problem:   Acute encephalopathy Active Problems:   CVA (cerebral vascular accident) (HCC)   Asthma   Acute on chronic respiratory failure with hypoxia (HCC)   Acute renal failure superimposed on stage 3a chronic kidney disease (HCC)   Hyponatremia   OSA (obstructive sleep apnea)   AMS (altered mental status)  Severe acute encephalopathy.  Unknown etiology and can be multifactorial with bilateral strokes. No obvious source of  infection except 1 out of 2 blood culture with MSSE which is most likely a contaminant, procalcitonin at 0.35, concern of cardiac embolic source which can be due to endocarditis or a thrombus secondary to heart failure?? -Transfer patient to stepdown, might need higher level of care if unable to protect airway. -Closely monitor for any signs of worsening.  Acute hypoxic respiratory failure.  Patient has  an history of heavy smoking but no underlying diagnosis of COPD, not on home oxygen.  Requiring nonrebreather intermittently. Might be due to encephalopathy versus pulmonary edema.  Elevated D-dimer but unable to obtain CTA due to AKI, VQ scan got canceled due to recurrent desaturation. Patient need diuresis-I will let cardiology decide about Lasix with milrinone due to softer blood pressure. Continue with supplemental oxygen. Low threshold for intubation to protect airway or for worsening hypoxia if needed. PCCM is aware.  Severe anemia/thrombocytopenia.  Might have GI bleed as husband was mentioning about dark color stools while she was having diarrhea for 2 days last week.  No recurrence at this time. Anemia panel with iron and folate deficiency.  Patient received 1 unit while in the ED. There was some concern of bone marrow involvement may be some post viral infection like parvo B virus but no available history, TTP/hemolysis, INR and PT is mildly elevated, normal APTT, mildly elevated LDH. -2 more units of PRBC -Start her on folic acid supplement. -Hematology consult -Pathology smear review -Add more labs like haptoglobin, repeat LDH, reticulocyte count, immature fractionated of platelet and a repeat differential. -Monitor CBC -Can involve GI-hold at this time as she is not actively bleeding and she might have mucosal bleed with thrombocytopenia.  CT abdomen with no concern of cirrhosis.  Congestive heart failure/elevated troponin.  No prior history of heart failure. Echocardiogram done-pending results Cardiology consult-might need TEE to rule out cardiac source of emboli. Patient will get benefit with diuresis but blood pressure is borderline at this time. I will defer decision to cardiology regarding milrinone with Lasix.  Bilateral multifocal cerebral infarct.  Most likely secondary to cardiac emboli. MRI without any significant stenosis. Neurology is on board and patient is being  worked up for stroke. No anticoagulation at this time due to severe anemia and thrombocytopenia.  1/2 blood cultures with MSSE.  Most likely a contaminant.  Patient is afebrile with no leukocytosis, borderline procalcitonin.  Cannot ignore it due to her acute illness and concern of endocarditis. Patient received 1 dose of cefepime and 2 doses of vancomycin earlier during admission. -Repeat blood culture -ID consult-holding antibiotics for now and ID can restart if needed. -Echocardiogram and a possible TEE to rule out cardiac source of emboli.  Hyponatremia.  Sodium was 122 during recent admission at [facility] earlier in May, improved to 130 corrected 131 during current hospitalization.  Most likely secondary to poor p.o. intake and volume overload. -Patient is on maintenance IV fluid. -Monitor sodium  AKI.  Unknown baseline, creatinine of 2.99.  It was 1.2 during her recent admission at [facility]. -Renal ultrasound. -Bladder scan to rule out urinary retention. -Monitor renal function -Avoid nephrotoxins  Objective: Vitals:   07/17/20 1237 07/17/20 1245 07/17/20 1300 07/17/20 1327  BP: 92/60 (!) 132/57 (!) 104/52   Pulse: 77 75 78   Resp: (!) 26 (!) 25 (!) 21   Temp: 98.6 F (37 C)     TempSrc: Axillary     SpO2: 100% 100% (!) 86% 96%  Weight:      Height:  Intake/Output Summary (Last 24 hours) at 07/17/2020 1349 Last data filed at 07/17/2020 1237 Gross per 24 hour  Intake 440 ml  Output --  Net 440 ml   Filed Weights   07/16/20 2011  Weight: 105 kg    Examination:  General exam: Lethargic and agitated lady, not following any commands. Respiratory system: Few scattered crackles bilaterally, mildly increased work of breathing. Cardiovascular system: S1 & S2 heard, RRR. Gastrointestinal system: Soft, nontender, nondistended, bowel sounds positive. Central nervous system: Lethargic and agitated, not following any commands, moving all extremities spontaneously. Extremities: 2+  LE edema, no cyanosis, pulses intact and symmetrical. Psychiatry: Judgement and insight appear impaired.   DVT prophylaxis: SCDs, patient has severe anemia and thrombocytopenia. Code Status: Full Family Communication: Discussed with husband at bedside. Disposition Plan:  Status is: Inpatient  Remains inpatient appropriate because:Inpatient level of care appropriate due to severity of illness   Dispo: The patient is from: Home              Anticipated d/c is to: To be determined              Patient currently is not medically stable to d/c.   Difficult to place patient No               Level of care: Stepdown  All the records are reviewed and case discussed with Care Management/Social Worker. Management plans discussed with the patient, nursing and they are in agreement.  Consultants:   Neurology  Cardiology  ID  Hematology  Procedures:  Antimicrobials:   Data Reviewed: I have personally reviewed following labs and imaging studies  CBC: Recent Labs  Lab 07/17/20 0025 07/17/20 0534 07/17/20 1227  WBC 6.8 6.0  --   HGB 5.0* 5.7* 6.6*  HCT 16.2* 18.3* 20.9*  MCV 98.8 97.9  --   PLT 101* 97*  --    Basic Metabolic Panel: Recent Labs  Lab 07/16/20 2016 07/17/20 0534  NA 130* 130*  K 4.3 4.5  CL 93* 96*  CO2 22 21*  GLUCOSE 123* 148*  BUN 70* 73*  CREATININE 3.15* 2.99*  CALCIUM 8.8* 8.6*   GFR: Estimated Creatinine Clearance: 21.6 mL/min (A) (by C-G formula based on SCr of 2.99 mg/dL (H)). Liver Function Tests: Recent Labs  Lab 07/16/20 2016 07/17/20 0534  AST 30 25  ALT 12 11  ALKPHOS 64 57  BILITOT 2.6* 2.5*  PROT 7.0 6.7  ALBUMIN 2.8* 2.7*   Recent Labs  Lab 07/16/20 2016  LIPASE 32   No results for input(s): AMMONIA in the last 168 hours. Coagulation Profile: Recent Labs  Lab 07/17/20 0018 07/17/20 0534  INR 1.5* 1.5*   Cardiac Enzymes: Recent Labs  Lab 07/16/20 2016  CKTOTAL 266*   BNP (last 3 results) No results for  input(s): PROBNP in the last 8760 hours. HbA1C: No results for input(s): HGBA1C in the last 72 hours. CBG: No results for input(s): GLUCAP in the last 168 hours. Lipid Profile: Recent Labs    07/17/20 0534  CHOL 76  HDL 13*  LDLCALC NOT CALCULATED  TRIG 69  CHOLHDL 5.8   Thyroid Function Tests: No results for input(s): TSH, T4TOTAL, FREET4, T3FREE, THYROIDAB in the last 72 hours. Anemia Panel: Recent Labs    07/16/20 2016 07/17/20 0534  VITAMINB12 659  --   FOLATE  --  4.0*  FERRITIN  --  108  TIBC  --  389  IRON  --  24*   Sepsis  Labs: Recent Labs  Lab 07/16/20 2016 07/17/20 0534 07/17/20 0901 07/17/20 1227  PROCALCITON 0.35 0.34  --   --   LATICACIDVEN 3.2*  --  2.5* 1.9    Recent Results (from the past 240 hour(s))  Blood culture (single)     Status: None (Preliminary result)   Collection Time: 07/16/20  8:07 PM   Specimen: Left Antecubital; Blood  Result Value Ref Range Status   Specimen Description LEFT ANTECUBITAL  Final   Special Requests   Final    BOTTLES DRAWN AEROBIC AND ANAEROBIC Blood Culture results may not be optimal due to an inadequate volume of blood received in culture bottles   Culture  Setup Time   Final    GRAM POSITIVE COCCI Organism ID to follow ANAEROBIC BOTTLE ONLY CRITICAL RESULT CALLED TO, READ BACK BY AND VERIFIED WITH: DEVIN MITCHELL 1200 07/17/2020 DLB Performed at [facility], [address] [place], [place] [address]    Culture GRAM POSITIVE COCCI  Final   Report Status PENDING  Incomplete  Blood Culture ID Panel (Reflexed)     Status: Abnormal   Collection Time: 07/16/20  8:07 PM  Result Value Ref Range Status   Enterococcus faecalis NOT DETECTED NOT DETECTED Final   Enterococcus Faecium NOT DETECTED NOT DETECTED Final   Listeria monocytogenes NOT DETECTED NOT DETECTED Final   Staphylococcus species DETECTED (A) NOT DETECTED Final    Comment: CRITICAL RESULT CALLED TO, READ BACK BY AND VERIFIED WITH: DEVIN  MITCHELL 1200 07/17/2020 DLB    Staphylococcus aureus (BCID) NOT DETECTED NOT DETECTED Final   Staphylococcus epidermidis DETECTED (A) NOT DETECTED Final    Comment: CRITICAL RESULT CALLED TO, READ BACK BY AND VERIFIED WITH: DEVIN MITCHELL 1200 07/17/2020 DLB    Staphylococcus lugdunensis NOT DETECTED NOT DETECTED Final   Streptococcus species NOT DETECTED NOT DETECTED Final   Streptococcus agalactiae NOT DETECTED NOT DETECTED Final   Streptococcus pneumoniae NOT DETECTED NOT DETECTED Final   Streptococcus pyogenes NOT DETECTED NOT DETECTED Final   A.calcoaceticus-baumannii NOT DETECTED NOT DETECTED Final   Bacteroides fragilis NOT DETECTED NOT DETECTED Final   Enterobacterales NOT DETECTED NOT DETECTED Final   Enterobacter cloacae complex NOT DETECTED NOT DETECTED Final   Escherichia coli NOT DETECTED NOT DETECTED Final   Klebsiella aerogenes NOT DETECTED NOT DETECTED Final   Klebsiella oxytoca NOT DETECTED NOT DETECTED Final   Klebsiella pneumoniae NOT DETECTED NOT DETECTED Final   Proteus species NOT DETECTED NOT DETECTED Final   Salmonella species NOT DETECTED NOT DETECTED Final   Serratia marcescens NOT DETECTED NOT DETECTED Final   Haemophilus influenzae NOT DETECTED NOT DETECTED Final   Neisseria meningitidis NOT DETECTED NOT DETECTED Final   Pseudomonas aeruginosa NOT DETECTED NOT DETECTED Final   Stenotrophomonas maltophilia NOT DETECTED NOT DETECTED Final   Candida albicans NOT DETECTED NOT DETECTED Final   Candida auris NOT DETECTED NOT DETECTED Final   Candida glabrata NOT DETECTED NOT DETECTED Final   Candida krusei NOT DETECTED NOT DETECTED Final   Candida parapsilosis NOT DETECTED NOT DETECTED Final   Candida tropicalis NOT DETECTED NOT DETECTED Final   Cryptococcus neoformans/gattii NOT DETECTED NOT DETECTED Final   Methicillin resistance mecA/C NOT DETECTED NOT DETECTED Final    Comment: Performed at [facility], [address] [place], [place] [address]   Resp Panel by RT-PCR (Flu A&B, Covid) Nasopharyngeal Swab     Status: None   Collection Time: 07/16/20  8:16 PM   Specimen: Nasopharyngeal Swab; Nasopharyngeal(NP)  swabs in vial transport medium  Result Value Ref Range Status   SARS Coronavirus 2 by RT PCR NEGATIVE NEGATIVE Final    Comment: (NOTE) SARS-CoV-2 target nucleic acids are NOT DETECTED.  The SARS-CoV-2 RNA is generally detectable in upper respiratory specimens during the acute phase of infection. The lowest concentration of SARS-CoV-2 viral copies this assay can detect is 138 copies/mL. A negative result does not preclude SARS-Cov-2 infection and should not be used as the sole basis for treatment or other patient management decisions. A negative result may occur with  improper specimen collection/handling, submission of specimen other than nasopharyngeal swab, presence of viral mutation(s) within the areas targeted by this assay, and inadequate number of viral copies(<138 copies/mL). A negative result must be combined with clinical observations, patient history, and epidemiological information. The expected result is Negative.  Fact Sheet for Patients:  BloggerCourse.com  Fact Sheet for Healthcare Providers:  SeriousBroker.it  This test is no t yet approved or cleared by the [place] FDA and  has been authorized for detection and/or diagnosis of SARS-CoV-2 by FDA under an Emergency Use Authorization (EUA). This EUA will remain  in effect (meaning this test can be used) for the duration of the COVID-19 declaration under Section 564(b)(1) of the Act, 21 U.S.C.section 360bbb-3(b)(1), unless the authorization is terminated  or revoked sooner.       Influenza A by PCR NEGATIVE NEGATIVE Final   Influenza B by PCR NEGATIVE NEGATIVE Final    Comment: (NOTE) The Xpert Xpress SARS-CoV-2/FLU/RSV plus assay is intended as an aid in the diagnosis of influenza from  Nasopharyngeal swab specimens and should not be used as a sole basis for treatment. Nasal washings and aspirates are unacceptable for Xpert Xpress SARS-CoV-2/FLU/RSV testing.  Fact Sheet for Patients: BloggerCourse.com  Fact Sheet for Healthcare Providers: SeriousBroker.it  This test is not yet approved or cleared by the [place] FDA and has been authorized for detection and/or diagnosis of SARS-CoV-2 by FDA under an Emergency Use Authorization (EUA). This EUA will remain in effect (meaning this test can be used) for the duration of the COVID-19 declaration under Section 564(b)(1) of the Act, 21 U.S.C. section 360bbb-3(b)(1), unless the authorization is terminated or revoked.  Performed at [facility], [address], [place], [place] [address]      Radiology Studies: CT Head Wo Contrast  Result Date: 07/16/2020 CLINICAL DATA:  Dizziness EXAM: CT HEAD WITHOUT CONTRAST TECHNIQUE: Contiguous axial images were obtained from the base of the skull through the vertex without intravenous contrast. COMPARISON:  None. FINDINGS: Brain: Areas of low-density noted in both frontal lobes concerning for acute to subacute infarcts. No hemorrhage or hydrocephalus. Vascular: No hyperdense vessel or unexpected calcification. Skull: No acute calvarial abnormality. Sinuses/Orbits: No acute findings Other: None IMPRESSION: Areas of low-density in both frontal lobes concerning for acute to subacute infarcts. Electronically Signed   By: Charlett Nose M.D.   On: 07/16/2020 22:08   MR ANGIO HEAD WO CONTRAST  Result Date: 07/17/2020 CLINICAL DATA:  Subarachnoid hemorrhage EXAM: MRA HEAD WITHOUT CONTRAST TECHNIQUE: Angiographic images of the Circle of Willis were acquired using MRA technique without intravenous contrast. COMPARISON:  No pertinent prior exam. FINDINGS: POSTERIOR CIRCULATION: --Vertebral arteries: Normal --Inferior cerebellar arteries:  Normal. --Basilar artery: Normal. --Superior cerebellar arteries: Normal. --Posterior cerebral arteries: Normal. ANTERIOR CIRCULATION: --Intracranial internal carotid arteries: Normal. --Anterior cerebral arteries (ACA): Normal. --Middle cerebral arteries (MCA): Normal. ANATOMIC VARIANTS: None IMPRESSION: Normal intracranial MRA. Electronically Signed   By: Caryn Bee  Chase PicketHerman M.D.   On: 07/17/2020 02:39   MR Angiogram Neck W or Wo Contrast  Result Date: 07/17/2020 CLINICAL DATA:  Encephalopathy EXAM: MRA NECK WITHOUT AND WITH CONTRAST TECHNIQUE: Multiplanar and multiecho pulse sequences of the neck were obtained without and with intravenous contrast. Angiographic images of the neck were obtained using MRA technique without and with intravenous contrast. CONTRAST:  10mL GADAVIST GADOBUTROL 1 MMOL/ML IV SOLN COMPARISON:  None. FINDINGS: Images are degraded by motion. Within that limitation, there is no hemodynamically significant stenosis visualized within the carotid systems. The vertebral arteries are left-dominant. Both vertebral arteries are normal. IMPRESSION: Motion degraded MRA of the neck without visible stenosis or acute abnormality of the carotid or vertebral arteries. Electronically Signed   By: Deatra RobinsonKevin  Herman M.D.   On: 07/17/2020 03:59   MR BRAIN WO CONTRAST  Result Date: 07/17/2020 CLINICAL DATA:  Encephalopathy EXAM: MRI HEAD WITHOUT CONTRAST TECHNIQUE: Multiplanar, multiecho pulse sequences of the brain and surrounding structures were obtained without intravenous contrast. COMPARISON:  None. FINDINGS: Brain: Multifocal bilateral acute ischemia. The largest areas of infarction are in the frontal lobes, but there are areas of ischemia in both parietal lobes, both occipital lobes and the right greater than left cerebellar hemispheres. No acute or chronic hemorrhage. There is multifocal hyperintense T2-weighted signal within the white matter. Parenchymal volume and CSF spaces are normal. The midline  structures are normal. Vascular: Major flow voids are preserved. Skull and upper cervical spine: Normal calvarium and skull base. Visualized upper cervical spine and soft tissues are normal. Sinuses/Orbits:Left maxillary sinus mucosal thickening. No mastoid or middle ear effusion. Normal orbits. IMPRESSION: 1. Multifocal bilateral acute ischemia. The largest areas are in the frontal lobes, but there are lesions in multiple vascular territories bilaterally. This is most consistent with a cardiac or aortic embolic source. 2. No hemorrhage or mass effect. Electronically Signed   By: Deatra RobinsonKevin  Herman M.D.   On: 07/17/2020 02:33   DG Chest Portable 1 View  Result Date: 07/16/2020 CLINICAL DATA:  Shortness of breath EXAM: PORTABLE CHEST 1 VIEW COMPARISON:  None. FINDINGS: Low lung volumes. Mild cardiomegaly. Diffuse interstitial prominence throughout the lungs. No effusions or pneumothorax. No acute bony abnormality. IMPRESSION: Cardiomegaly. Diffuse interstitial prominence could reflect interstitial edema or less likely atypical infection. Electronically Signed   By: Charlett NoseKevin  Dover M.D.   On: 07/16/2020 20:41   CT CHEST ABDOMEN PELVIS WO CONTRAST  Result Date: 07/16/2020 CLINICAL DATA:  Shortness of breath. EXAM: CT CHEST, ABDOMEN AND PELVIS WITHOUT CONTRAST TECHNIQUE: Multidetector CT imaging of the chest, abdomen and pelvis was performed following the standard protocol without IV contrast. COMPARISON:  None. FINDINGS: CT CHEST FINDINGS Cardiovascular: Heart is mildly enlarged.  Aorta normal caliber. Mediastinum/Nodes: No mediastinal, hilar, or axillary adenopathy. Trachea and esophagus are unremarkable. Thyroid unremarkable. Lungs/Pleura: Ground-glass airspace opacities within the upper lobes bilaterally, left greater than right as well as left lower lobe. No effusions or pneumothorax. Musculoskeletal: Chest wall soft tissues are unremarkable. No acute bony abnormality. CT ABDOMEN PELVIS FINDINGS Hepatobiliary: No  focal liver abnormality is seen. Status post cholecystectomy. No biliary dilatation. Pancreas: No focal abnormality or ductal dilatation. Spleen: No focal abnormality.  Normal size. Adrenals/Urinary Tract: Fullness of the left adrenal gland compatible with hyperplasia. Right adrenal gland and kidneys unremarkable. No stones or hydronephrosis. Stomach/Bowel: Stomach, large and small bowel grossly unremarkable. Vascular/Lymphatic: Aortic atherosclerosis. No evidence of aneurysm or adenopathy. Reproductive: Prior hysterectomy.  No adnexal masses. Other: No free fluid or free air. Musculoskeletal: No  acute bony abnormality. IMPRESSION: Cardiomegaly. Mild ground-glass opacities in the lungs, left greater than right. This could reflect asymmetric edema or infection. No acute findings in the abdomen or pelvis. Aortic atherosclerosis. Electronically Signed   By: Charlett Nose M.D.   On: 07/16/2020 22:13    Scheduled Meds: .  stroke: mapping our early stages of recovery book   Does not apply Once  . sodium chloride flush  3 mL Intravenous Q12H   Continuous Infusions: . sodium chloride    . sodium chloride       LOS: 0 days   Time spent: 90 minutes. More than 50% of the time was spent in counseling/coordination of care. Patient is critically sick with multiorgan dysfunction and high risk for deterioration and death.  Arnetha Courser, MD [facility]  If 7PM-7AM, please contact night-coverage [email or web address]  07/17/2020, 1:49 PM   This record has been created using Conservation officer, historic buildings. Errors have been sought and corrected,but may not always be located. Such creation errors do not reflect on the standard of care.

## 2020-07-18 ENCOUNTER — Inpatient Hospital Stay: Payer: Medicaid Other

## 2020-07-18 DIAGNOSIS — G9340 Encephalopathy, unspecified: Secondary | ICD-10-CM | POA: Diagnosis not present

## 2020-07-18 DIAGNOSIS — N179 Acute kidney failure, unspecified: Secondary | ICD-10-CM | POA: Diagnosis not present

## 2020-07-18 DIAGNOSIS — R7881 Bacteremia: Secondary | ICD-10-CM | POA: Diagnosis not present

## 2020-07-18 DIAGNOSIS — I6389 Other cerebral infarction: Secondary | ICD-10-CM

## 2020-07-18 DIAGNOSIS — I5033 Acute on chronic diastolic (congestive) heart failure: Secondary | ICD-10-CM

## 2020-07-18 DIAGNOSIS — N189 Chronic kidney disease, unspecified: Secondary | ICD-10-CM | POA: Diagnosis not present

## 2020-07-18 DIAGNOSIS — E538 Deficiency of other specified B group vitamins: Secondary | ICD-10-CM | POA: Diagnosis not present

## 2020-07-18 DIAGNOSIS — D500 Iron deficiency anemia secondary to blood loss (chronic): Secondary | ICD-10-CM | POA: Diagnosis not present

## 2020-07-18 DIAGNOSIS — I749 Embolism and thrombosis of unspecified artery: Secondary | ICD-10-CM

## 2020-07-18 LAB — CBC
HCT: 25.7 % — ABNORMAL LOW (ref 36.0–46.0)
HCT: 25.9 % — ABNORMAL LOW (ref 36.0–46.0)
Hemoglobin: 8 g/dL — ABNORMAL LOW (ref 12.0–15.0)
Hemoglobin: 8.3 g/dL — ABNORMAL LOW (ref 12.0–15.0)
MCH: 29.9 pg (ref 26.0–34.0)
MCH: 30.4 pg (ref 26.0–34.0)
MCHC: 31.1 g/dL (ref 30.0–36.0)
MCHC: 32 g/dL (ref 30.0–36.0)
MCV: 95.9 fL (ref 80.0–100.0)
MCV: 94.9 fL (ref 80.0–100.0)
Platelets: 77 10*3/uL — ABNORMAL LOW (ref 150–400)
Platelets: 73 10*3/uL — ABNORMAL LOW (ref 150–400)
RBC: 2.68 MIL/uL — ABNORMAL LOW (ref 3.87–5.11)
RBC: 2.73 MIL/uL — ABNORMAL LOW (ref 3.87–5.11)
RDW: 21.2 % — ABNORMAL HIGH (ref 11.5–15.5)
RDW: 21.8 % — ABNORMAL HIGH (ref 11.5–15.5)
WBC: 7.1 10*3/uL (ref 4.0–10.5)
WBC: 6.7 10*3/uL (ref 4.0–10.5)
nRBC: 1.3 % — ABNORMAL HIGH (ref 0.0–0.2)
nRBC: 1.1 % — ABNORMAL HIGH (ref 0.0–0.2)

## 2020-07-18 LAB — CBC WITH DIFFERENTIAL/PLATELET
Abs Immature Granulocytes: 0.18 10*3/uL — ABNORMAL HIGH (ref 0.00–0.07)
Basophils Absolute: 0 10*3/uL (ref 0.0–0.1)
Basophils Relative: 0 %
Eosinophils Absolute: 0 10*3/uL (ref 0.0–0.5)
Eosinophils Relative: 0 %
HCT: 24.9 % — ABNORMAL LOW (ref 36.0–46.0)
Hemoglobin: 8.1 g/dL — ABNORMAL LOW (ref 12.0–15.0)
Immature Granulocytes: 2 %
Lymphocytes Relative: 18 %
Lymphs Abs: 1.3 10*3/uL (ref 0.7–4.0)
MCH: 30.8 pg (ref 26.0–34.0)
MCHC: 32.5 g/dL (ref 30.0–36.0)
MCV: 94.7 fL (ref 80.0–100.0)
Monocytes Absolute: 0.3 10*3/uL (ref 0.1–1.0)
Monocytes Relative: 4 %
Neutro Abs: 5.7 10*3/uL (ref 1.7–7.7)
Neutrophils Relative %: 76 %
Platelets: 79 10*3/uL — ABNORMAL LOW (ref 150–400)
RBC: 2.63 MIL/uL — ABNORMAL LOW (ref 3.87–5.11)
RDW: 21.1 % — ABNORMAL HIGH (ref 11.5–15.5)
WBC: 7.6 10*3/uL (ref 4.0–10.5)
nRBC: 0.9 % — ABNORMAL HIGH (ref 0.0–0.2)

## 2020-07-18 LAB — MRSA PCR SCREENING: MRSA by PCR: NEGATIVE

## 2020-07-18 LAB — RENAL FUNCTION PANEL
Albumin: 2.9 g/dL — ABNORMAL LOW (ref 3.5–5.0)
Anion gap: 13 (ref 5–15)
BUN: 72 mg/dL — ABNORMAL HIGH (ref 6–20)
CO2: 21 mmol/L — ABNORMAL LOW (ref 22–32)
Calcium: 8.9 mg/dL (ref 8.9–10.3)
Chloride: 100 mmol/L (ref 98–111)
Creatinine, Ser: 2.41 mg/dL — ABNORMAL HIGH (ref 0.44–1.00)
GFR, Estimated: 23 mL/min — ABNORMAL LOW (ref >60)
Glucose, Bld: 120 mg/dL — ABNORMAL HIGH (ref 70–99)
Phosphorus: 5.1 mg/dL — ABNORMAL HIGH (ref 2.5–4.6)
Potassium: 3.7 mmol/L (ref 3.5–5.1)
Sodium: 134 mmol/L — ABNORMAL LOW (ref 135–145)

## 2020-07-18 LAB — TYPE AND SCREEN
ABO/RH(D): O POS
Antibody Screen: NEGATIVE
Unit division: 0
Unit division: 0
Unit division: 0

## 2020-07-18 LAB — BILIRUBIN, FRACTIONATED(TOT/DIR/INDIR)
Bilirubin, Direct: 0.9 mg/dL — ABNORMAL HIGH (ref 0.0–0.2)
Indirect Bilirubin: 1.7 mg/dL — ABNORMAL HIGH (ref 0.3–0.9)
Total Bilirubin: 2.6 mg/dL — ABNORMAL HIGH (ref 0.3–1.2)

## 2020-07-18 LAB — BPAM RBC
Blood Product Expiration Date: 202207062359
Blood Product Expiration Date: 202207062359
Blood Product Expiration Date: 202207062359
ISSUE DATE / TIME: 202206020206
ISSUE DATE / TIME: 202206020840
ISSUE DATE / TIME: 202206021637
Unit Type and Rh: 5100
Unit Type and Rh: 5100
Unit Type and Rh: 5100

## 2020-07-18 LAB — GLUCOSE, CAPILLARY: Glucose-Capillary: 112 mg/dL — ABNORMAL HIGH (ref 70–99)

## 2020-07-18 LAB — HEMOGLOBIN A1C
Hgb A1c MFr Bld: 5 % (ref 4.8–5.6)
Mean Plasma Glucose: 97 mg/dL

## 2020-07-18 LAB — LACTATE DEHYDROGENASE: LDH: 317 U/L — ABNORMAL HIGH (ref 98–192)

## 2020-07-18 LAB — UREA NITROGEN, URINE: Urea Nitrogen, Ur: 492 mg/dL

## 2020-07-18 LAB — AMMONIA: Ammonia: 17 umol/L (ref 9–35)

## 2020-07-18 LAB — PROCALCITONIN: Procalcitonin: 0.27 ng/mL

## 2020-07-18 IMAGING — US US EXTREM  UP VENOUS BILAT
2 series · 12 of 24 positions shown · non-contrast
Comparison: None.

CLINICAL DATA: Bilateral upper extremity pain and edema for the
past several weeks. Evaluate for DVT.

EXAM:
BILATERAL UPPER EXTREMITY VENOUS DOPPLER ULTRASOUND
TECHNIQUE: Gray-scale sonography with graded compression, as well as color
Doppler and duplex ultrasound were performed to evaluate the
bilateral upper extremity deep venous systems from the level of the
subclavian vein and including the jugular, axillary, basilic,
radial, ulnar and upper cephalic vein. Spectral Doppler was utilized
to evaluate flow at rest and with distal augmentation maneuvers.

[Series 1: us venous img upper bilat (dvt) · portal-venous · 6 of 30 slices shown]
[im 3/30]
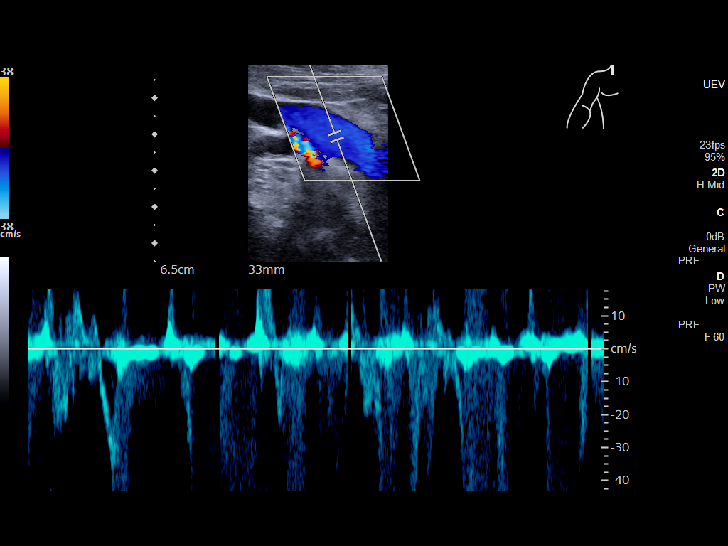
[im 8/30]
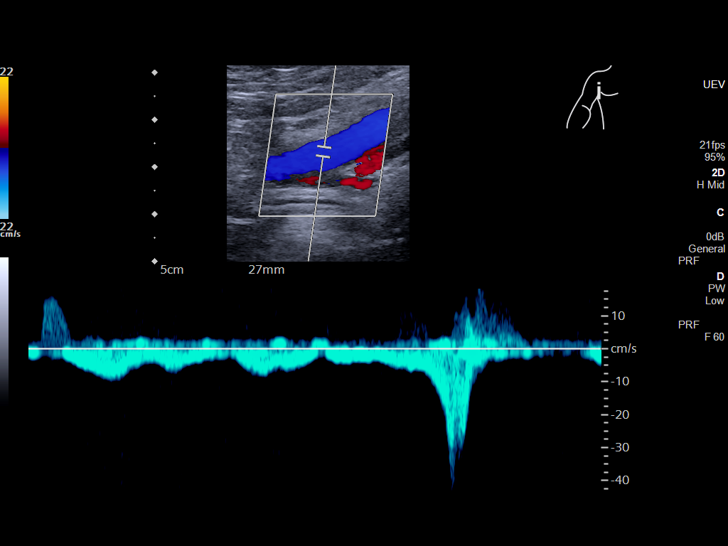
[im 14/30]
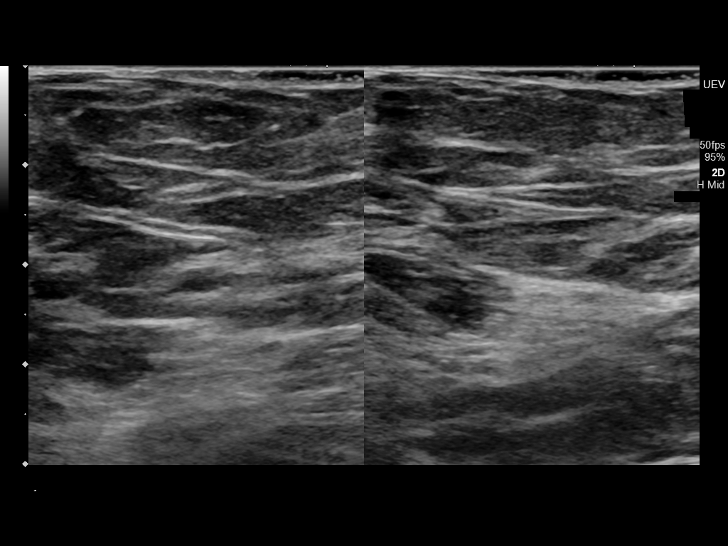
[im 19/30]
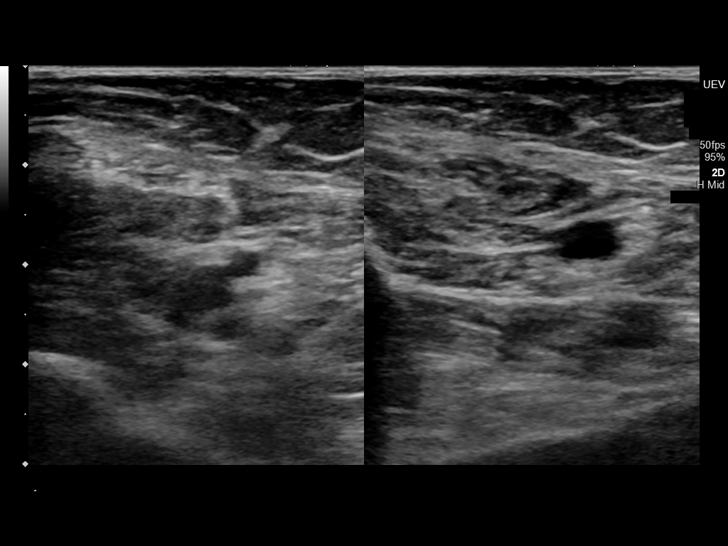
[im 24/30]
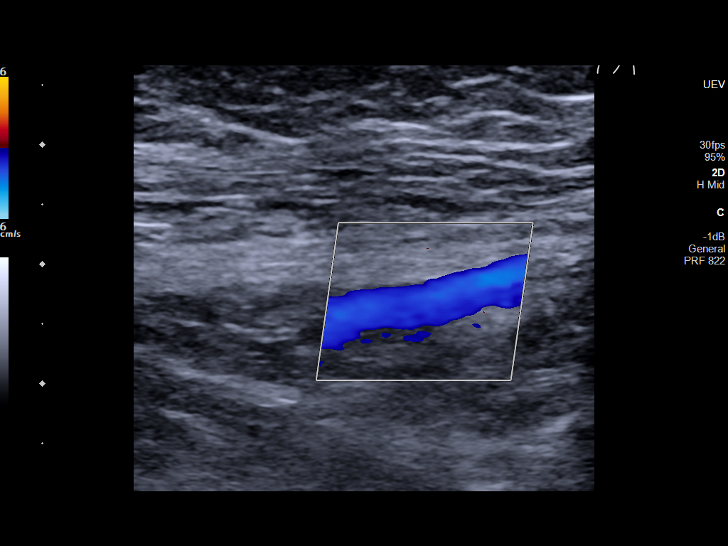
[im 30/30]
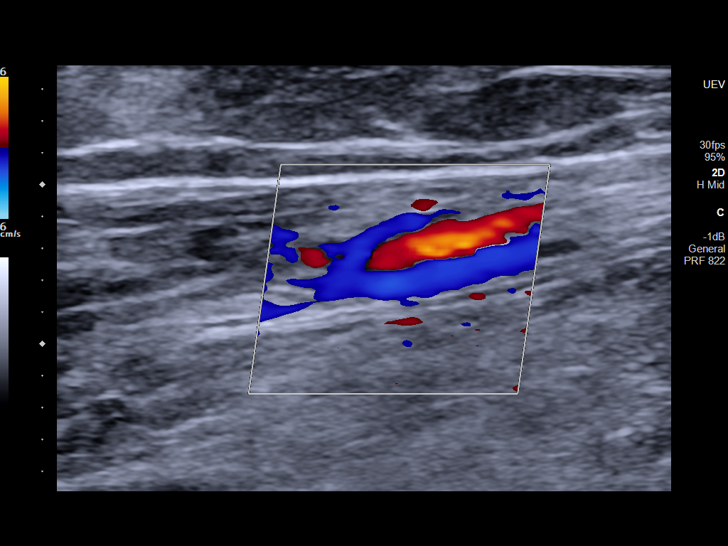

[Series 1001: uev · 6 of 31 slices shown]
[im 3/31]
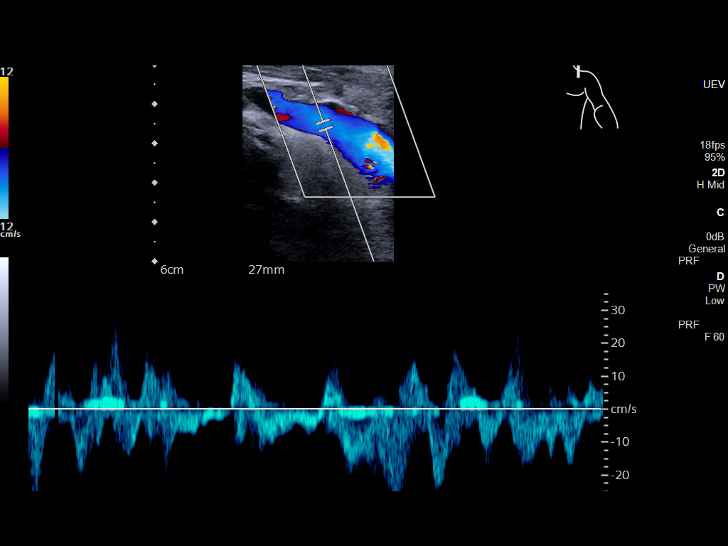
[im 9/31]
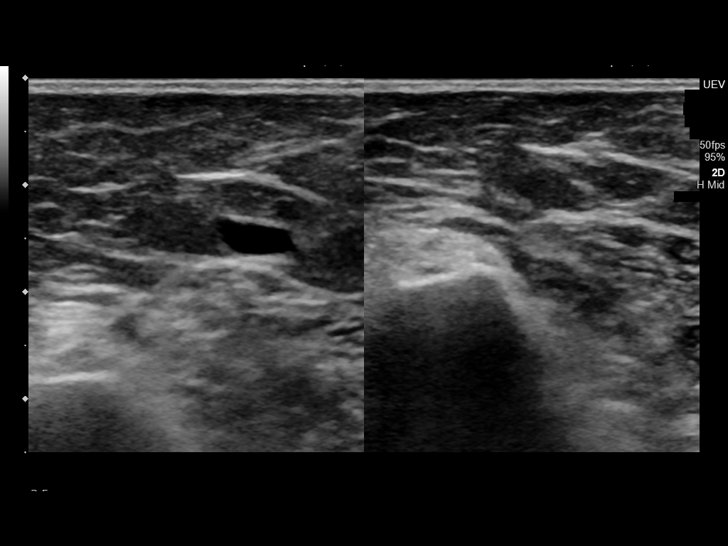
[im 14/31]
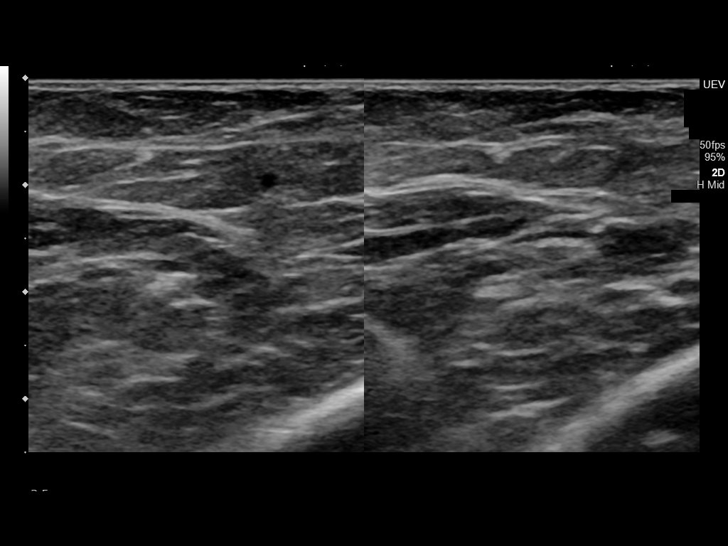
[im 20/31]
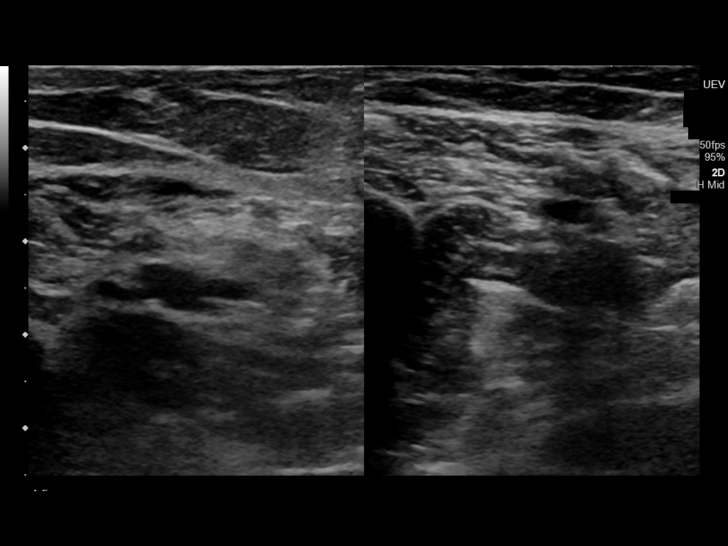
[im 25/31]
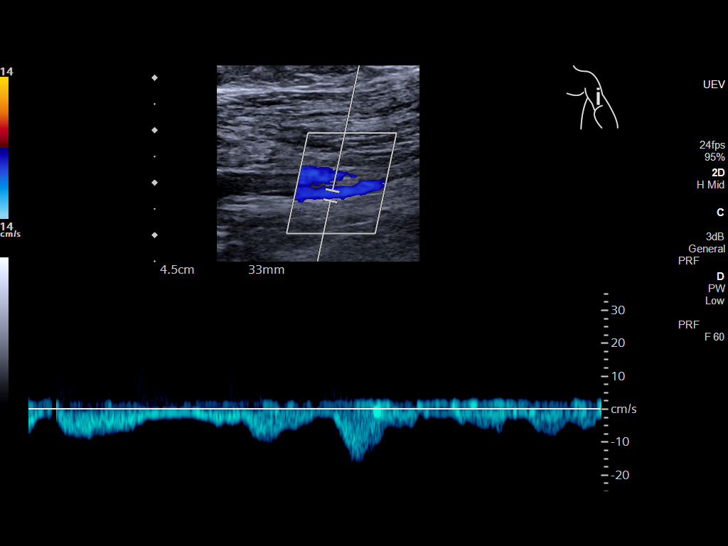
[im 31/31]
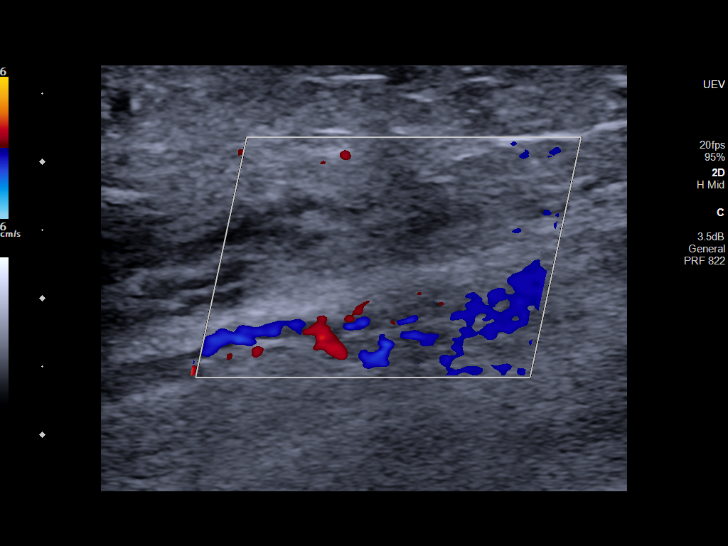

[12 of 24 positions shown; findings below may reference images not displayed]

FINDINGS: RIGHT UPPER EXTREMITY

Internal Jugular Vein: No evidence of thrombus. Normal
compressibility, respiratory phasicity and response to augmentation.

Subclavian Vein: No evidence of thrombus. Normal compressibility,
respiratory phasicity and response to augmentation.

Axillary Vein: No evidence of thrombus. Normal compressibility,
respiratory phasicity and response to augmentation.

Cephalic Vein: No evidence of thrombus. Normal compressibility,
respiratory phasicity and response to augmentation.

Basilic Vein: No evidence of thrombus. Normal compressibility,
respiratory phasicity and response to augmentation.

Brachial Veins: No evidence of thrombus. Normal compressibility,
respiratory phasicity and response to augmentation.

Radial Veins: No evidence of thrombus. Normal compressibility,
respiratory phasicity and response to augmentation.

Ulnar Veins: No evidence of thrombus. Normal compressibility,
respiratory phasicity and response to augmentation.

Venous Reflux:  None.

Other Findings:  None.

LEFT UPPER EXTREMITY

Internal Jugular Vein: No evidence of thrombus. Normal
compressibility, respiratory phasicity and response to augmentation.

Subclavian Vein: No evidence of thrombus. Normal compressibility,
respiratory phasicity and response to augmentation.

Axillary Vein: No evidence of thrombus. Normal compressibility,
respiratory phasicity and response to augmentation.

Cephalic Vein: While the proximal aspect of the cephalic vein
appears widely patent (image 9), there is hypoechoic occlusive
thrombus involving the mid aspect of the cephalic vein at the level
of the mid humerus (images 10 and 12).

Basilic Vein: No evidence of thrombus. Normal compressibility,
respiratory phasicity and response to augmentation.

Brachial Veins: No evidence of thrombus. Normal compressibility,
respiratory phasicity and response to augmentation.

Radial Veins: No evidence of thrombus. Normal compressibility,
respiratory phasicity and response to augmentation.

Ulnar Veins: No evidence of thrombus. Normal compressibility,
respiratory phasicity and response to augmentation.

Venous Reflux:  None.

Other Findings:  None.
IMPRESSION: 1. No evidence of DVT within either upper extremity.
2. Examination is positive for short-segment occlusive superficial
thrombophlebitis involving mid humeral aspect of the cephalic vein.

## 2020-07-18 IMAGING — DX DG CHEST 1V PORT
1 series · 1 of 1 positions shown · non-contrast
Comparison: [DATE]

CLINICAL DATA: Acute respiratory failure with hypoxia

EXAM:
PORTABLE CHEST 1 VIEW

[chest ap]
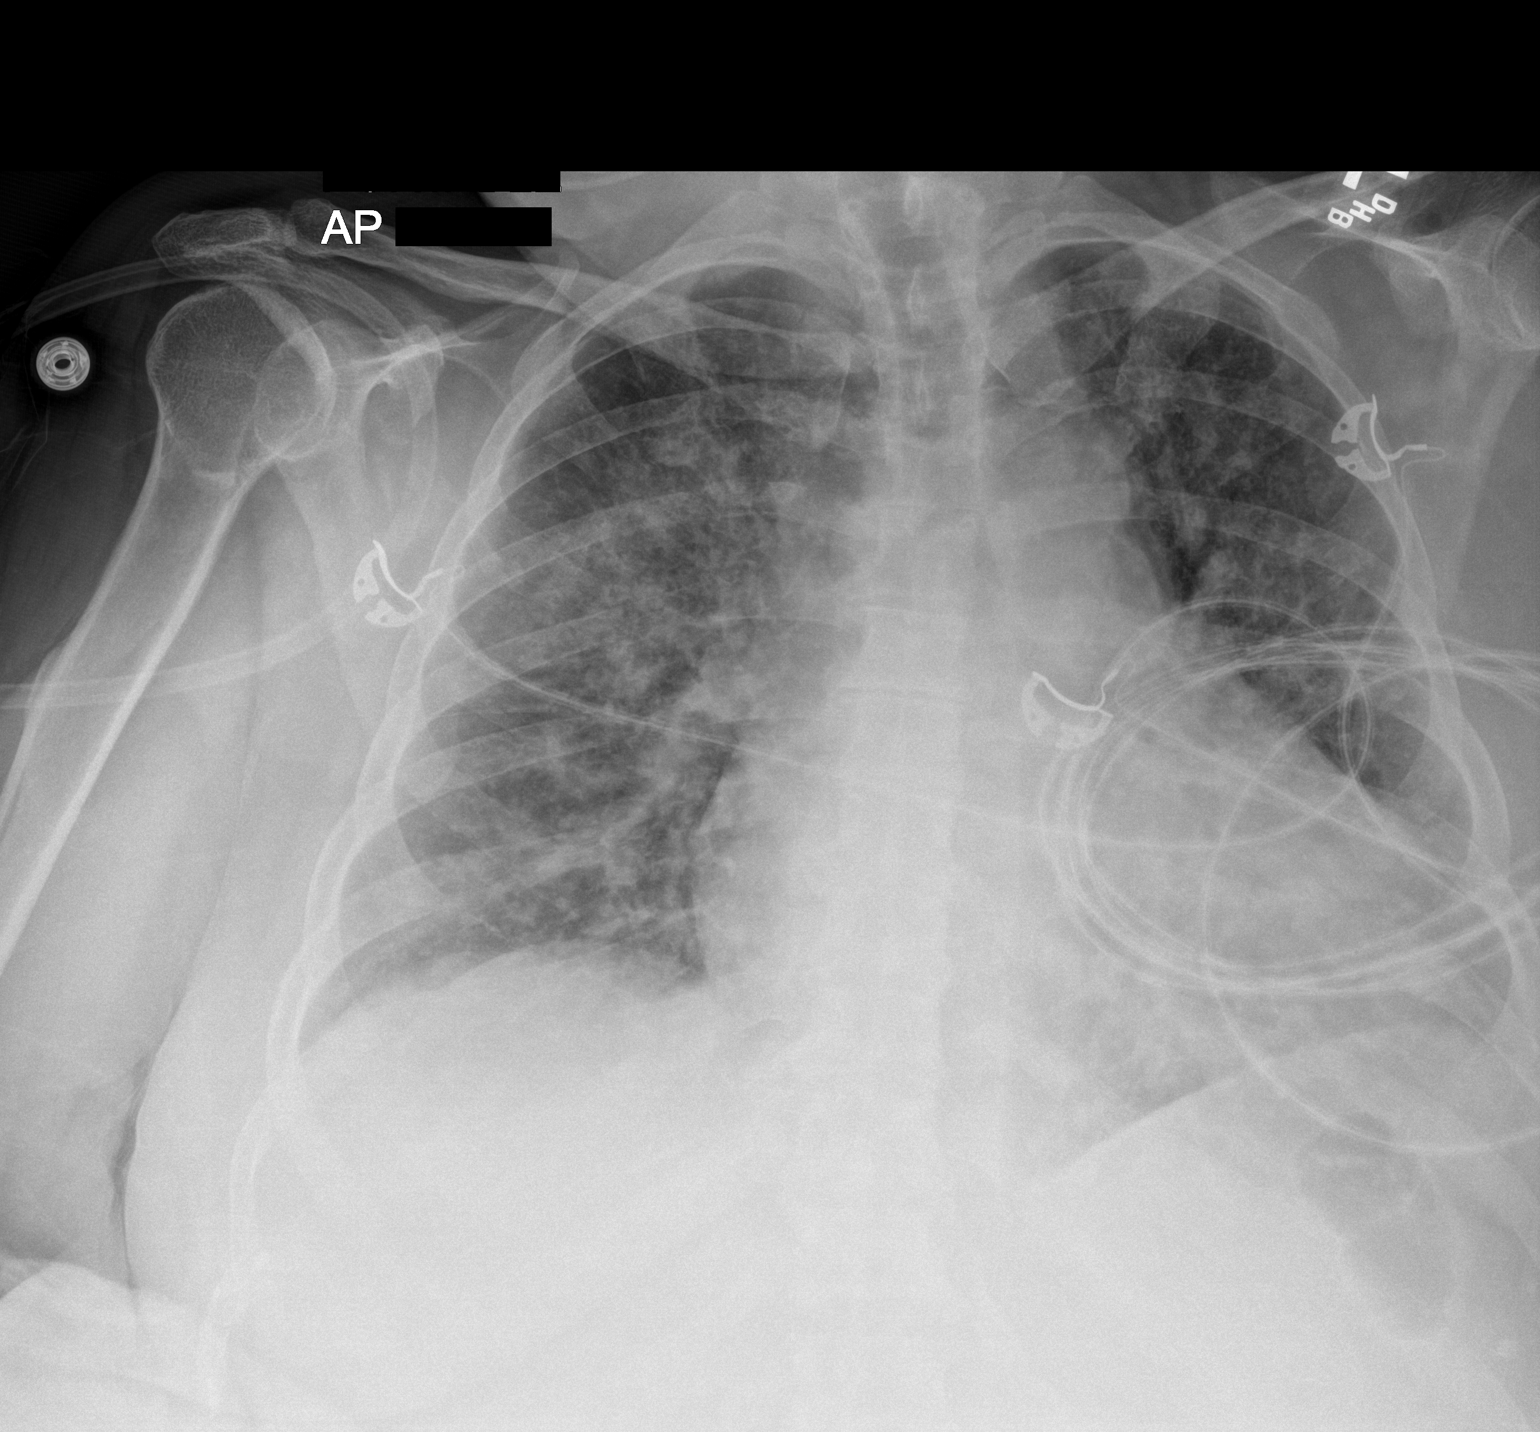

[1 of 1 positions shown; findings below may reference images not displayed]

FINDINGS: Single frontal view of the chest demonstrates an enlarged cardiac
silhouette. Stable diffuse ground-glass airspace disease compatible
with edema. No effusion or pneumothorax. No acute bony
abnormalities.
IMPRESSION: 1. Persistent bilateral ground-glass airspace disease consistent
with pulmonary edema or atypical infection. No change since prior
exam.

## 2020-07-18 MED ORDER — SODIUM CHLORIDE 0.9 % IV SOLN
200.0000 mg | Freq: Once | INTRAVENOUS | Status: AC
  Administered 2020-07-18: 200 mg via INTRAVENOUS
  Filled 2020-07-18: qty 10

## 2020-07-18 MED ORDER — CEFAZOLIN SODIUM-DEXTROSE 2-4 GM/100ML-% IV SOLN
2.0000 g | Freq: Two times a day (BID) | INTRAVENOUS | Status: DC
  Administered 2020-07-19 (×2): 2 g via INTRAVENOUS
  Filled 2020-07-18 (×4): qty 100

## 2020-07-18 MED ORDER — NICOTINE 21 MG/24HR TD PT24
21.0000 mg | MEDICATED_PATCH | Freq: Every day | TRANSDERMAL | Status: DC
  Administered 2020-07-18 – 2020-08-01 (×15): 21 mg via TRANSDERMAL
  Filled 2020-07-18 (×15): qty 1

## 2020-07-18 MED ORDER — CHLORHEXIDINE GLUCONATE CLOTH 2 % EX PADS
6.0000 | MEDICATED_PAD | Freq: Every day | CUTANEOUS | Status: DC
  Administered 2020-07-18 – 2020-07-29 (×9): 6 via TOPICAL

## 2020-07-18 MED ORDER — FOLIC ACID 1 MG PO TABS
1.0000 mg | ORAL_TABLET | Freq: Every day | ORAL | Status: DC
Start: 2020-07-18 — End: 2020-08-01
  Administered 2020-07-18 – 2020-08-01 (×14): 1 mg via ORAL
  Filled 2020-07-18 (×14): qty 1

## 2020-07-18 NOTE — Progress Notes (Signed)
NAME:  Brandi ReaperKimberly D Kimbler, MRN:  [id number], DOB:  04/25/1965, LOS: 1 ADMISSION DATE:  07/16/2020, CONSULTATION DATE:  07/17/20 REFERRING MD:  Dr. Nelson ChimesAmin, CHIEF COMPLAINT:  Altered Mental Status, SOB and Hypoxia  Brief Pt Description / Synopsis:  55 y.o. Female admitted with Acute CVA and Acute Metabolic Encephalopathy possibly due septic emboli from suspected Endocarditis, MSSE Bacteremia, Acute Hypoxic Respiratory Failure in the setting of Pulmonary Edema +/- AECOPD, AKI on CKD 3, severe anemia and thrombocytopenia.  High risk for deterioration and need for intubation.  History of Present Illness:  Brandi Holland is a 55 year old female with a past medical history significant for asthma, OSA, and fibromyalgia who [facility] ED on 07/16/2020 due to complaints of shortness of breath and altered mental status.  Upon presentation she was noted to be hypoxic with O2 saturations in the 60s.  Patient is currently with altered mental status, therefore history is obtained from ED and nursing notes, along with reports per patient's husband.  Per the patient's husband she has not been feeling well for the past 2 weeks, unable to get out of bed, with very poor p.o. intake.  He reports she did have diarrhea, black in color, with some blood noted in the stool last week (approximately 2 days in duration).  She refused to see a doctor, and she continued to deteriorate.  Patient's husband asked her son who lives in [place] to come in, and upon his arrival he called EMS.  Husband denies any knowledge of of recent upper respiratory illnesses, fever, chills, abdominal pain, chest pain, dysuria.  Of note she was [facility] from 06/18/2020 to 06/20/2020 due to lower extremity edema.  She was diuresed, however she left before full work-up could be performed.  No echocardiogram was performed.  CTA chest was negative for PE at that time, and no significant abnormality on labs except for sodium of 122.  Work-up: Patient  remained very altered with intermittent desaturation, ABG with some respiratory alkalosis, no hypoxia.1/2 blood culture bottles with MSSE, MRI brain with multiple bilateral infarcts, worsening renal function so unable to obtain CTA, VQ scan ordered but got canceled due to recurrent desaturation.  D-dimer at 2.6, hemoglobin of 5.7, platelet counts of 97 this morning and there were 255 [facility], BUN of 73 and creatinine of 2.99, sodium of 130, chloride of 96 and bicarb of 21.  Elevated T bili at 2.5 and INR of 1.5, lactic acidosis 3.2>>2.5>>1.9, elevated troponin 708>>904, BNP at 1893, CT chest and abdomen with bilateral groundglass opacities which can be due to pulmonary edema versus atypical pneumonia and no significant abnormality on CT abdomen.  Echocardiogram ordered-pending. Received 1 unit of PRBC with improvement of hemoglobin to 6.6-2 more units ordered.  Patient is being admitted to stepdown unit by the hospitalist for further work-up and treatment of Acute CVA and Acute Metabolic Encephalopathy possibly due septic emboli from suspected Endocarditis, MSSE Bacteremia, Acute Hypoxic Respiratory Failure in the setting of Pulmonary Edema +/- AECOPD, AKI on CKD 3, severe anemia and thrombocytopenia.  High risk for deterioration and need for intubation.  Neurology, cardiology, infectious disease, and PCCM have been consulted.   Pertinent  Medical History  Asthma Obstructive sleep apnea Fibromyalgia  Micro Data:  07/16/2020: SARS-CoV-2 PCR and influenza PCR>> negative 07/16/2020: Blood culture w/ 1 of 2 >> MSSE 07/17/2020: HIV screen>> nonreactive 07/17/2020: Strep pneumo urinary antigen>> 07/17/2020: Legionella urinary antigen>>  Antimicrobials:  Cefepime 6/1>>6/2 Vancomycin 6/1>> 6/2 Cefazolin  6/2>>  Significant Hospital Events: Including procedures, antibiotic start and stop dates in addition to other pertinent events   . 07/16/2020: Presented to ED with altered mental  status and shortness of breath, MRI with CVA, Neurology consulted . 07/17/2020: 1/2 Blood cultures + for MSSE, concern for infective endocarditis with septic emboli, Cardiology, ID, PCCM consulted. High risk for deterioration. ID changed ABX to Cefazolin  . 07/18/2020: More alert today (remains confused), no focal deficits, tolerating ice chips, speech evaluation obtained. [place] of bilateral LE &UE negative for DVT. Transthoracic Echo w/o evidence of vegetations  Interim History / Subjective:  -No acute events noted overnight -Afebrile, hemodynamically stable, placed on HHFNC and weaning down FiO2 to 55% currently -Pt is more alert today (remains confused) -Able to follow commands, no focal deficits, tolerating ice chips, Speech therapy evaluation -Hgb improved to 8.1 today s/p blood transfusions yesterday -Creatinine improved to 2.41 today (2.99 yesterday), UOP 450 cc last 24 hrs (net + 440 ml since admit) -Ultrasound of bilateral LE & UE negative for DVT -ROS is limited due to confusion, but pt denies chest pain, SOB, N/V/D, fever, chills  Objective   Blood pressure (!) 125/39, pulse 82, temperature 98 F (36.7 C), temperature source Axillary, resp. rate 18, height  (1.448 m), weight 91.7 kg, SpO2 97 %.    FiO2 (%):  [40 %-96 %] 40 %   Intake/Output Summary (Last 24 hours) at 07/18/2020 1519 Last data filed at 07/18/2020 1400 Gross per 24 hour  Intake 640 ml  Output 450 ml  Net 190 ml   Filed Weights   07/16/20 2011 07/18/20 0500  Weight: 105 kg 91.7 kg    Examination: General: Acutely ill-appearing female, sitting in bed, on HHFNC, no acute distress HENT: Atraumatic, normocephalic, neck supple, no JVD Lungs: Coarse breath sounds with mild expiratory wheezing to bilateral bases, even, nonlabored Cardiovascular: Regular rate and rhythm, S1-S2, no murmurs, rubs, gallops Abdomen: Obese, soft, nontender, nondistended, no guarding or rebound tenderness, bowel sounds positive  x4 Extremities: Normal bulk and tone, no deformities, no edema Neuro: Awake and alert, oriented to person and place, moves all extremities to commands, no focal deficits, speech clear, pupils PERRLA GU: Deferred  Labs/imaging that I havepersonally reviewed  (right click and "Reselect all SmartList Selections" daily)  Labs 6//2022: Sodium 134, bicarb 21, glucose 120, BUN 72, creatinine 2.41, LDH 317, procalcitonin 0.27, WBC 7.1, hemoglobin 8.0, hematocrit 25.7, platelets 77, ammonia 17  Chest x-ray 07/18/2020:1. Persistent bilateral ground-glass airspace disease consistent with pulmonary edema or atypical infection. No change since prior exam. CT head without contrast 07/16/2020:Areas of low-density in both frontal lobes concerning for acute to subacute infarcts. MRI brain 07/17/2020:1. Multifocal bilateral acute ischemia. The largest areas are in the frontal lobes, but there are lesions in multiple vascular territories bilaterally. This is most consistent with a cardiac or aortic embolic source. 2. No hemorrhage or mass effect. MRA head  07/17/2020: Normal intracranial MRA. MRA neck 07/17/2020:Motion degraded MRA of the neck without visible stenosis or acute abnormality of the carotid or vertebral arteries. Renal ultrasound 07/17/2020>> No evidence of hydronephrosis or other acute renal abnormality Venous US BLE 07/18/20>>negative for DVT Venous US BUE 07/18/20>>1. No evidence of DVT within either upper extremity. 2. Examination is positive for short-segment occlusive superficial thrombophlebitis involving mid humeral aspect of the cephalic vein. Echocardiogram 07/17/20:1. Left ventricular ejection fraction, by estimation, is 50 to 55%. The  left ventricle has low normal function. The left ventricle has no regional  wall motion abnormalities. Left ventricular diastolic parameters are  indeterminate.  2. Right ventricular systolic function is normal. The right ventricular  size is normal. Tricuspid  regurgitation signal is inadequate for assessing  PA pressure.  3. Left atrial size was mildly dilated.  4. Right atrial size was mildly dilated.  5. The mitral valve is normal in structure. No evidence of mitral valve  regurgitation. No evidence of mitral stenosis.  6. The aortic valve is normal in structure. Aortic valve regurgitation is  mild. Mild aortic valve stenosis.  7. The inferior vena cava is dilated in size with <50% respiratory  variability, suggesting right atrial pressure of 15 mmHg.   Resolved Hospital Problem list     Assessment & Plan:   Acute CVA & Acute Metabolic Encephalopathy, possibly due Cardiac embolic source in setting of ? Endocarditis vs thrombus 2/2 Heart Failure -MRI head 07/17/2020 with multifocal bilateral acute ischemia (largest area in the frontal lobes, but lesions in multiple vascular territories bilaterally) most consistent with cardiac/aortic embolic source; No hemorrhagic or mass-effect noted. -MRA head normal, MRA neck without visible stenosis or acute abnormality of the carotid or vertebral arteries -Frequent neuro checks -Neurology following, appreciate input -Speech therapy, PT/OT -No antiplatelets at this time due to severe anemia and thrombocytopenia -UDS negative  MSSE Bacteremia (1/2 blood cultures +, ? Contaminant) ? Endocarditis Unable to rule out Multifocal Pneumonia Elevated Troponin in setting of Demand Ischemia vs. NSTEMI -Monitor fever curve -Trend WBC's & Procalcitonin -Follow cultures as above -Check strep pneumo and Legionella urinary antigens -ID consulted, appreciate input  -Continue Cefazolin as per ID recommendations -No vegetations noted on transthoracic Echo -Cardiology consulted for possible TEE  ? Endocarditis Elevated Troponin in setting of Demand Ischemia vs. NSTEMI Acute on Chronic CHF (suspect diastolic) -Continuous cardiac monitoring -Maintain MAP greater than 65 -Vasopressors if needed to maintain MAP  goal -Trend lactic acid ~ lactic acidosis resolved -Trend at HS troponin until peaked -Cardiology following, appreciate input -Diuresis as BP and renal function permits ~ will defer to Cardiology -Echocardiogram with LVEF 50-55%, indeterminate diastolic parameters, RV systolic function normal, no vegetations noted -Consider TEE when medically stable  Acute Hypoxic Respiratory Failure in the setting of Pulmonary Edema +/- AECOPD PMHx of Asthma, OSA, suspected OHS -Supplemental O2 as needed to maintain O2 saturations greater than 92% -Currently tolerating HHFNC and protecting airway -High risk for intubation due to AMS -Follow intermittent chest x-ray and ABG as needed -Diuresis as BP and renal function permits ~ will defer to Cardiology -Bronchodilators & Budesonide nebs -Elevated D-dimer, but unable to perform CTA Chest due to AKI -Unable to complete Lung V/Q scan due to episodes of desaturation during scan  -Obtained Ultrasound of bilateral upper & Lower extremities ~ all negative for DVT's  Anemia without overt s/sx of bleeding (pt's husband mentioned dark colored stools for 2 days last week, no recurrence at this time) Thrombocytopenia -Monitor for S/Sx of bleeding -Trend CBC -SCD's for VTE Prophylaxis (chemical prophylaxis contraindicated due to severe anemia and thrombocytopenia)  -Transfuse for Hgb <7 -Transfuse platelets for platelet count less than 50 K and active bleeding -Hematology following, appreciate input  AKI on CKD Stage III (unkown baseline, Cr. 1.2 during recent admission at [facility]) Hyponatremia -Monitor I&O's / urinary output -Follow BMP -Ensure adequate renal perfusion -Avoid nephrotoxic agents as able -Replace electrolytes as indicated -Renal US is pending ~ negative -Consider Nephrology consult   Pt is critically ill.  High risk for deterioration, cardiac arrest and death.  Prognosis is guarded.   Best practice (right click and "Reselect all SmartList  Selections" daily)  Diet:  NPO Pain/Anxiety/Delirium protocol (if indicated): No VAP protocol (if indicated): Not indicated DVT prophylaxis: Contraindicated GI prophylaxis: PPI Glucose control:  SSI No Central venous access:  N/A Arterial line:  N/A Foley:  N/A Mobility:  bed rest  PT consulted: N/A Last date of multidisciplinary goals of care discussion [07/18/20] Code Status:  full code Disposition: Stepdown   Labs   CBC: Recent Labs  Lab 07/17/20 0534 07/17/20 1227 07/18/20 0115 07/18/20 0555 07/18/20 1216  WBC 6.0 6.3 7.6 7.1 6.7  NEUTROABS  --  4.5 5.7  --   --   HGB 5.7* 6.5*  6.6* 8.1* 8.0* 8.3*  HCT 18.3* 20.6*  20.9* 24.9* 25.7* 25.9*  MCV 97.9 96.7 94.7 95.9 94.9  PLT 97* 80* 79* 77* 73*    Basic Metabolic Panel: Recent Labs  Lab 07/16/20 2016 07/17/20 0534 07/18/20 0115  NA 130* 130* 134*  K 4.3 4.5 3.7  CL 93* 96* 100  CO2 22 21* 21*  GLUCOSE 123* 148* 120*  BUN 70* 73* 72*  CREATININE 3.15* 2.99* 2.41*  CALCIUM 8.8* 8.6* 8.9  PHOS  --   --  5.1*   GFR: Estimated Creatinine Clearance: 24.6 mL/min (A) (by C-G formula based on SCr of 2.41 mg/dL (H)). Recent Labs  Lab 07/16/20 2016 07/17/20 0025 07/17/20 0534 07/17/20 0901 07/17/20 1227 07/18/20 0115 07/18/20 0555 07/18/20 1216  PROCALCITON 0.35  --  0.34  --   --  0.27  --   --   WBC  --    < > 6.0  --  6.3 7.6 7.1 6.7  LATICACIDVEN 3.2*  --   --  2.5* 1.9  --   --   --    < > = values in this interval not displayed.    Liver Function Tests: Recent Labs  Lab 07/16/20 2016 07/17/20 0534 07/18/20 0115 07/18/20 1216  AST 30 25  --   --   ALT 12 11  --   --   ALKPHOS 64 57  --   --   BILITOT 2.6* 2.5*  --  2.6*  PROT 7.0 6.7  --   --   ALBUMIN 2.8* 2.7* 2.9*  --    Recent Labs  Lab 07/16/20 2016  LIPASE 32   Recent Labs  Lab 07/18/20 0837  AMMONIA 17    ABG    Component Value Date/Time   PHART 7.46 (H) 07/17/2020 1211   PCO2ART 29 (L) 07/17/2020 1211   PO2ART 204  (H) 07/17/2020 1211   HCO3 20.6 07/17/2020 1211   ACIDBASEDEF 2.8 (H) 07/17/2020 1211   O2SAT 99.8 07/17/2020 1211     Coagulation Profile: Recent Labs  Lab 07/17/20 0018 07/17/20 0534  INR 1.5* 1.5*    Cardiac Enzymes: Recent Labs  Lab 07/16/20 2016  CKTOTAL 266*    HbA1C: No results found for: HGBA1C  CBG: Recent Labs  Lab 07/18/20 0022  GLUCAP 112*    Review of Systems:   Unable to assess due to AMS  Past Medical History:  She,  has a past medical history of Arthritis, Asthma, Fibromyalgia, OSA (obstructive sleep apnea), and Peripheral neuropathy.   Surgical History:  No surgical history on file   Social History:      Family History:  Her family history includes Cancer in her father and sister; Chorea in her mother and sister; Dementia in her father;  Emphysema in her mother; Heart disease in her sister; Hypertension in her mother; Osteoporosis in her sister; Thyroid disease in her mother; Ulcers in her mother.   Allergies Allergies  Allergen Reactions  . Clarithromycin Swelling  . Sulfa Antibiotics Swelling     Home Medications  Prior to Admission medications   Medication Sig Start Date End Date Taking? Authorizing Provider  clotrimazole (LOTRIMIN) 1 % cream Apply 1 application topically 2 (two) times daily. 06/22/20  Yes [provider]  PROAIR HFA 108 (90 Base) MCG/ACT inhaler Inhale 2 puffs into the lungs every 4 (four) hours as needed. 06/22/20  Yes [provider]  furosemide (LASIX) 20 MG tablet Take 20 mg by mouth daily. 06/20/20 07/04/20  [provider]     Critical care time: 35 minutes     Harlon Ditty, AGACNP-BC [facility] Prefer epic messenger for cross cover needs If after hours, please call E-link

## 2020-07-18 NOTE — Evaluation (Signed)
Clinical/Bedside Swallow Evaluation Patient Details  Name: Brandi Holland MRN: [id number] Date of Birth: [birth year]  Today's Date: 07/18/2020 Time: SLP Start Time (ACUTE ONLY): 1025 SLP Stop Time (ACUTE ONLY): 1042 SLP Time Calculation (min) (ACUTE ONLY): 17 min  Past Medical History:  Past Medical History:  Diagnosis Date  . Arthritis   . Asthma   . Fibromyalgia   . OSA (obstructive sleep apnea)   . Peripheral neuropathy    Past Surgical History: History reviewed. No pertinent surgical history. 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 MRI revealed multifocal bilateral acute ischemia. The largest areas are in the frontal lobes, but there are lesions in multiple vascular territories bilaterally. This is most consistent with a cardiac or aortic embolic source. 2. No hemorrhage or mass effect. Chest x-ray 07/17/2020 - Persistent bilateral ground-glass airspace disease consistent with pulmonary edema or atypical infection. No change since prior exam.   Assessment / Plan / Recommendation Clinical Impression  Pt is very encephalopathic which is further complicated by pt being HOH. Pt is oriented to herself and location only, impulsive, unable to follow directions. Pt demonstrated adequate oropharyngeal abilities when consuming ice chips via spoon, thin liquids via spoon and cup, puree and graham crackers. While pt greatly enjoyed the graham crackers, her oral phase was prolonged d/t ineffective mastication as a result of poor dentition. Pt displays an intermittent cough that is not directly related to PO intake, her vitals were stable throughout and her vocal quality was clear. Her husband was present and he stated she eats softer foods at home d/t poor dentition and that she "coughs all the time, not just when eating because she  smokes so much." At this time, I recommend a conservative diet of dysphagia 2 d/t pt's poor dentition with thin liquids via cup, medicine whole with thin liquids (may be crushed if so desired). ST intervention doesn't appear indicated a this time as husband stated that she was "looking good" when eating and drinking during the assessment. SLP Visit Diagnosis: Dysphagia, oropharyngeal phase (R13.12)    Aspiration Risk  Mild aspiration risk    Diet Recommendation Dysphagia 2 (Fine chop);Thin liquid   Liquid Administration via: Cup;No straw Medication Administration: Whole meds with liquid (may crush if needed) Supervision: Staff to assist with self feeding;Full supervision/cueing for compensatory strategies Compensations: Minimize environmental distractions;Slow rate;Small sips/bites Postural Changes: Seated upright at 90 degrees;Remain upright for at least 30 minutes after po intake    Other  Recommendations Oral Care Recommendations: Oral care BID   Follow up Recommendations None      Frequency and Duration   N/A         Prognosis Prognosis for Safe Diet Advancement: Guarded Barriers to Reach Goals: Cognitive deficits (at baseline consumed soft solids)      Swallow Study   General Date of Onset: 07/17/20 HPI: Pt is a 55 y/o admitted on 07/17/2020 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 MRI revealed multifocal bilateral acute ischemia. The largest areas are in the frontal lobes, but there are lesions in multiple vascular territories bilaterally. This is most consistent with a cardiac or aortic embolic source. 2. No hemorrhage or mass effect. Chest x-ray 07/17/2020 - Persistent bilateral ground-glass airspace disease consistent with pulmonary edema or atypical infection.  No change since prior exam. Type of Study: Bedside Swallow Evaluation Previous Swallow  Assessment: none in chart Diet Prior to this Study: NPO Temperature Spikes Noted: No Respiratory Status:  (HFNC) History of Recent Intubation: No Behavior/Cognition: Alert;Pleasant mood;Confused;Requires cueing;Distractible;Doesn't follow directions Oral Cavity Assessment: Within Functional Limits Oral Care Completed by SLP: Recent completion by staff Oral Cavity - Dentition: Poor condition Vision: Functional for self-feeding Self-Feeding Abilities: Needs assist;Needs set up Patient Positioning: Upright in bed Baseline Vocal Quality: Normal (at baseline) Volitional Cough: Cognitively unable to elicit Volitional Swallow: Unable to elicit    Oral/Motor/Sensory Function Overall Oral Motor/Sensory Function: Within functional limits   Ice Chips Ice chips: Within functional limits Presentation: Spoon;Self Fed   Thin Liquid Thin Liquid: Within functional limits Presentation: Self Fed;Cup;Spoon    Nectar Thick Nectar Thick Liquid: Not tested   Honey Thick Honey Thick Liquid: Not tested   Puree Puree: Within functional limits Presentation: Spoon   Solid     Solid: Within functional limits Presentation: Self Fed Other Comments: dysphagia 2 textures and graham cracker     Brandi Holland B. Dreama Saa M.S., CCC-SLP, [facility] Speech-Language Pathologist Rehabilitation Services Office [phone]  Brandi Holland Dreama Saa 07/18/2020,12:47 PM

## 2020-07-18 NOTE — Evaluation (Addendum)
Occupational Therapy Evaluation Patient Details Name: Brandi Holland MRN: [id number] DOB: 1966/01/25 Today's Date: 07/18/2020    History of Present Illness Pt is a 55 y/o F with PMH: unremarkable due to noncompliance with medical care. Pt presented to ED with 2-week history of feeling unwell/acting confused. Pt was seen at an OSH for concern for fluid overload with CHF as differentials. W/u at [facility] included: MRI showing multiple embolic looking infarctions in bilateral hemispheres concerning for a central embolic source. 2D Echo: unlikely HF, but does show left and right atrial enlargement. Blood cultures + for staph epi. Pt adm for encephalopathy with CVAs and ICU d/t respiratory failure (currently on HHFNC).   Clinical Impression   Pt seen for OT evaluation this date in setting of acute hospitalization with multiple dx including AMS, stroke, and respiratory failure. Of note: VQ scan was held on 6/2 d/t pt with consistent desaturation. Pt stable on HHFNC this date. MD Reesa Chew) was messaged via secure chat and approves light OT evaluation and intervention. RN was present throughout. Pt requesting to use restroom and OT assists RN in rolling pt laterally x3 trials with MAX A +2 for safety and for physical assistance. Pt does make meaningful effort when cued to use her UEs to assist in reaching for and grabbing bed rails to aide in rolling/mobilization. Pt requires increased processing time and tactile cues to successfully follow these commands. Pt UEs assessed and it appears L UE is slightly weaker than R. Somewhat difficult to formally assess d/t pt with decreased cognitive status at time of evaluation. Pt requires hand over hand and moderate tactile cues/verbal cues to perform one bed level (high-fowler's position) grooming ADL with at least MIN A. Pt left in bed with all needs met and in reach. Pt on bed pan and RN aware. Pt's spouse present in room and educated re: potential rehab/post-hospitalization  needs. He is agreeable to considering rehabilitation as pt was completely INDEP at baseline including driving and presents with significant weakness and decline in functional status at this time. Will continue to follow. Recommending CIR at this time.    Follow Up Recommendations  CIR    Equipment Recommendations  Other (comment) (defer to next level of care)    Recommendations for Other Services       Precautions / Restrictions Precautions Precautions: Fall Restrictions Weight Bearing Restrictions: No Other Position/Activity Restrictions: HHFNC      Mobility Bed Mobility Overal bed mobility: Needs Assistance Bed Mobility: Rolling Rolling: Max assist;+2 for physical assistance;+2 for safety/equipment         General bed mobility comments: Pt does make a meaningful effort to reach for bed railing to contribute to lateral rolling for peri care when she is prompted by OT/RN    Transfers                 General transfer comment: deferred, unsafe at this time    Balance                                           ADL either performed or assessed with clinical judgement   ADL Overall ADL's : Needs assistance/impaired                                       General  ADL Comments: hand over hand and MAX A to complete 1 bed level, UB g/h task. Pt requires MAX A +2 for bed level rolling and for getting on bed pan and MAX A +1 for peri care in side-lying     Vision Patient Visual Report: No change from baseline Additional Comments: difficult to formally assess 2/2 cognition     Perception     Praxis      Pertinent Vitals/Pain Pain Assessment: No/denies pain     Hand Dominance     Extremity/Trunk Assessment Upper Extremity Assessment Upper Extremity Assessment: Generalized weakness (able to raise against gravity. Pt is R hand dominant. R UE slightly stronger with MMT ~4-/5 and MMT of L UE ~3+/5. Difficult to assess sensation or  [place] d/t pt with confusion/decreased cognitive status.)   Lower Extremity Assessment Lower Extremity Assessment: Generalized weakness       Communication Communication Communication: Other (comment) (somewhat garbled, intermittently difficult to understand)   Cognition Arousal/Alertness:  (somwhat drowsy, but she is rousable and able to attend.) Behavior During Therapy: [facility] for tasks assessed/performed Overall Cognitive Status: Impaired/Different from baseline Area of Impairment: Orientation;Attention;Memory;Following commands;Safety/judgement;Awareness;Problem solving                 Orientation Level: Disoriented to;Place;Time;Situation Current Attention Level: Alternating Memory: Decreased short-term memory Following Commands: Follows one step commands inconsistently Safety/Judgement: Decreased awareness of deficits Awareness: Emergent Problem Solving: Slow processing;Decreased initiation;Difficulty sequencing;Requires verbal cues;Requires tactile cues General Comments: Pt able to follow ~60-70% of simple one step commands wtih increased processing time, possible partially r/t being [facility].   General Comments  deferred, unsafe at this time. Notable redness/irritation in peri area. RN aware.    Exercises Other Exercises Other Exercises: OT engages pt and spouse in education re: role of OT. Pt with limtied capacity for carryover of learning at this time, but her spouse has good understanding. Also discussed potential rehabilitation needs.   Shoulder Instructions      Home Living Family/patient expects to be discharged to:: Private residence Living Arrangements: Spouse/significant other Available Help at Discharge: Family;Available PRN/intermittently (spouse works 5am-2pm (ish)) Type of Home: House Home Access: Stairs to enter;Ramped entrance (ramp entrance was put in for pt's grandmother) Technical brewer of Steps: 3, does not have to use entrance with steps   Home  Layout: One level               Home Equipment: Walker - 2 wheels;Cane - single point;Shower seat - built in;Bedside commode;Wheelchair - manual   Additional Comments: all above-listed equipment was stored, belonged to patient's grandmother      Prior Functioning/Environment Level of Independence: Independent        Comments: Pt was INDEP including driving and running errands, was declining and requiring increased assitance from spouse over course of 2 weeks d/t generally feeling poorly.        OT Problem List: Decreased strength;Decreased range of motion;Decreased activity tolerance;Impaired balance (sitting and/or standing);Decreased coordination;Decreased cognition;Decreased safety awareness;Decreased knowledge of use of DME or AE;Cardiopulmonary status limiting activity      OT Treatment/Interventions: Self-care/ADL training;DME and/or AE instruction;Therapeutic activities;Balance training;Therapeutic exercise;Energy conservation;Patient/family education    OT Goals(Current goals can be found in the care plan section) Acute Rehab OT Goals Patient Stated Goal: to get stronger and back to being able to care for herself OT Goal Formulation: With family Time For Goal Achievement: 08/01/20 Potential to Achieve Goals: Fair ADL Goals Pt Will Perform Grooming: with supervision;with set-up;sitting (supported sitting with <10%  verbal cues to sequence) Pt Will Transfer to Toilet: with max assist;with +2 assist;squat pivot transfer;bedside commode (with drop arm or slideboard as needed) Additional ADL Goal #1: Pt will follow 100% of simple one step command tasks and ~50% of 2 step tasks. Additional ADL Goal #2: Pt will tolerate increased mobilization (at least sup to sit with MAX A +2) to allow for further development of OT POC.  OT Frequency: Min 1X/week   Barriers to D/C: Decreased caregiver support  husband is involved, but works and needs to continue working        [employer] OT "6 Clicks" Daily Activity     Outcome Measure Help from another person eating meals?: A Lot Help from another person taking care of personal grooming?: A Lot Help from another person toileting, which includes using toliet, bedpan, or urinal?: Total Help from another person bathing (including washing, rinsing, drying)?: Total Help from another person to put on and taking off regular upper body clothing?: A Lot Help from another person to put on and taking off regular lower body clothing?: Total 6 Click Score: 9   End of Session Equipment Utilized During Treatment: Oxygen (pt on 40L/40% througout session. Sats sustained >91% at all times) Nurse Communication: Mobility status  Activity Tolerance: Patient tolerated treatment well Patient left: in bed;with call bell/phone within reach;with bed alarm set;with family/visitor present  OT Visit Diagnosis: Unsteadiness on feet (R26.81);Muscle weakness (generalized) (M62.81);Other symptoms and signs involving the nervous system (R29.898);Other symptoms and signs involving cognitive function                Time: 2867-5198 OT Time Calculation (min): 26 min Charges:  OT General Charges $OT Visit: 1 Visit OT Evaluation $OT Eval Moderate Complexity: 1 Mod OT Treatments $Self Care/Home Management : 8-22 mins  Gerrianne Scale, MS, OTR/L ascom [phone] 07/18/20, 3:17 PM

## 2020-07-18 NOTE — Progress Notes (Signed)
PROGRESS NOTE    Brandi Holland  ZOX:[id number] DOB: [birth year] DOA: 07/16/2020 PCP: Pcp, No   Brief Narrative: Taken from H&P.  Brandi Holland is a 55 y.o. female with medical history significant of asthma, OSA, fibromyalgia who presents via EMS for shortness of breath.  She was hypoxic in 60s requiring 5 to 6 L of oxygen with intermittent nonrebreather. Initially admitting provider unable to obtained any history as there was no family member at bedside, no one could be reached on phone and patient was very altered.  Able to get hold of husband after multiple attempts at bedside, according to him patient was not feeling well for the past few weeks, she was admitted at [facility] on 06/18/2020 with lower extremity edema and was diuresed and discharged on 06/20/2020.  No echocardiogram done, CTA was negative for PE at that time, no significant abnormality on labs except sodium of 122 which improved to 130 and mild AKI, unknown baseline. Patient continued to feel bad and she was staying in bed for the past more than 2 weeks, unable to get out of bed, very poor p.o. intake.  Per husband she did had diarrhea with black color stool and he has seen some blood in stool last week, diarrhea lasted for 2 days and then resolved.  He did not noticed any hematemesis.  Patient does not drink alcohol but is a heavy smoker, smokes at least 2 packs/day.  Husband was able to brought her to ED 2 weeks ago and then she left after waiting couple of hours without being seen.  Per husband she was refusing to go see a doctor, when she continued to deteriorate he asked her son who lives in [place] who drove from there and talked with her and called the EMS. Husband denies any recent upper respiratory illnesses, fever or chills.  He did noticed worsening shortness of breath.  Patient was mostly bedbound for the past 2 weeks, he was using diapers to help her as she was unable to go to the bathroom by herself due to profound  weakness.  Patient remained very altered with intermittent desaturation, ABG with some respiratory alkalosis, no hypoxia.1/2 blood culture bottles with MSSE, MRI brain with multiple bilateral infarcts, worsening renal function so unable to obtain CTA, VQ scan ordered but got canceled due to recurrent desaturation.  D-dimer at 2.6, hemoglobin of 5.7, platelet counts of 97 this morning and there were 255 during recent hospitalization at [facility], BUN of 73 and creatinine of 2.99, sodium of 130, chloride of 96 and bicarb of 21.  Elevated T bili at 2.5 and INR of 1.5, lactic acidosis 3.2>>2.5>>1.9, elevated troponin 708>>904, BNP at 1893, CT chest and abdomen with bilateral groundglass opacities which can be due to pulmonary edema versus atypical pneumonia and no significant abnormality on CT abdomen.  Echocardiogram with EF of 50 to 55%, which is within lower normal limit, no wall motion abnormalities and indeterminate diastolic parameters, mildly biatrial dilatation and dilated inferior vena cava with less than 50% variability. Received total of 3 units PRBC Had swallow evaluation today and they are recommending dysphagia 2 diet.  Subjective: Patient appears little more alert but remained confused.  She is quite hard of hearing, appears to have good coordination by taking ice chips from the cup of water.  Following some commands.  Assessment & Plan:   Principal Problem:   Acute encephalopathy Active Problems:   CVA (cerebral vascular accident) (HCC)   Asthma   Acute respiratory failure  with hypoxia (HCC)   AKI (acute kidney injury) (HCC)   Hyponatremia   OSA (obstructive sleep apnea)   AMS (altered mental status)  Severe acute encephalopathy.  Little more alert today but still confused  Unknown etiology and can be multifactorial with bilateral strokes. No obvious source of infection except 1 out of 2 blood culture with MSSE which is most likely a contaminant, procalcitonin at 0.35>>0.27, concern of  cardiac embolic source which can be due to endocarditis or a thrombus secondary to heart failure?? -Continue to monitor  Acute hypoxic respiratory failure.  Patient has an history of heavy smoking but no underlying diagnosis of COPD, not on home oxygen.  Requiring nonrebreather intermittently. Might be due to encephalopathy versus pulmonary edema.  Elevated D-dimer but unable to obtain CTA due to AKI, VQ scan got canceled due to recurrent desaturation. Patient need diuresis-I will let cardiology decide about Lasix with milrinone due to softer blood pressure. Continue with supplemental oxygen. Low threshold for intubation to protect airway or for worsening hypoxia if needed. PCCM is aware.  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  Might have GI bleed as husband was mentioning about dark color stools while she was having diarrhea for 2 days last week.  No recurrence at this time. Anemia panel with iron and folate deficiency.  Patient received 1 unit while in the ED, and 2 L yesterday.  Hemoglobin at 8 today, also received IV iron.  Mild worsening in platelet 277 today, no obvious bleeding.  Hematology is involved-appreciate their help.  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  Rest of the labs pending. -Monitor CBC -Check FOBT -Can involve GI-hold at this time as she is not actively bleeding and she might have mucosal bleed with thrombocytopenia.  CT abdomen with no concern of cirrhosis.  HFpEF/elevated troponin.  No prior history of heart failure. Echocardiogram with low normal EF and indeterminant diastolic dysfunction, most likely diastolic heart failure.  Lung sounds quite wet. Cardiology is on board-I will let them decide about diuresis.  Bilateral multifocal cerebral infarct.  Most likely secondary to cardiac emboli, TTE without any obvious source, will need TEE and rule out PFO.  Extremity Doppler scans were negative for any DVT, 1 small acute  superficial thrombophlebitis in upper extremity. MRA without any significant stenosis. Neurology is on board and patient is being worked up for stroke. No anticoagulation at this time due to severe anemia and thrombocytopenia.  1/2 blood cultures with MSSE.  Most likely a contaminant.  Patient is afebrile with no leukocytosis, borderline procalcitonin.  Cannot ignore it due to her acute illness and concern of endocarditis. Patient received 1 dose of cefepime and 2 doses of vancomycin earlier during admission. -Repeat blood culture -ordered yesterday but apparently never get drawn. -ID consult-started her on cefazolin based on her risk factor. -TTE without any significant abnormality -Possible TEE to rule out cardiac source of emboli.  Hyponatremia.  Sodium was 122 during recent admission at [facility] earlier in May, improved to 134 today.  Most likely secondary to poor p.o. intake and volume overload. -Patient is on maintenance IV fluid. -Monitor sodium  AKI.  Unknown baseline, continue to improve, at 2.41 today.  It was 1.2 during her recent admission at [facility].  Renal ultrasound without any significant abnormality.  Foley was placed for strict intake and output. -Monitor renal function -Avoid nephrotoxins -We will try taking Foley out tomorrow if encephalopathy continues to improve.  Objective: Vitals:   07/18/20 1230 07/18/20 1300 07/18/20 1400  07/18/20 1430  BP: (!) 104/50 (!) 109/93  116/73  Pulse: 81 84 85 82  Resp: 18 (!) 22 (!) 26 (!) 24  Temp:      TempSrc:      SpO2: 94% 92% 94% 95%  Weight:      Height:        Intake/Output Summary (Last 24 hours) at 07/18/2020 1452 Last data filed at 07/18/2020 0400 Gross per 24 hour  Intake 590 ml  Output 450 ml  Net 140 ml   Filed Weights   07/16/20 2011 07/18/20 0500  Weight: 105 kg 91.7 kg    Examination:  General.  Chronically ill-appearing lady, in no acute distress. Pulmonary.  Bilateral scattered crackles, normal respiratory  effort. CV.  Regular rate and rhythm, no JVD, rub or murmur. Abdomen.  Soft, nontender, nondistended, BS positive. CNS.  Alert but appears confused, no focal neurologic deficit. Extremities.  No edema, no cyanosis, pulses intact and symmetrical. Psychiatry.  Judgment and insight appears impaired.  DVT prophylaxis: SCDs, patient has severe anemia and thrombocytopenia. Code Status: Full Family Communication: Discussed with husband on phone. Disposition Plan:  Status is: Inpatient  Remains inpatient appropriate because:Inpatient level of care appropriate due to severity of illness   Dispo: The patient is from: Home              Anticipated d/c is to: To be determined              Patient currently is not medically stable to d/c.   Difficult to place patient No               Level of care: Stepdown  All the records are reviewed and case discussed with Care Management/Social Worker. Management plans discussed with the patient, nursing and they are in agreement.  Consultants:   Neurology  Cardiology  ID  Hematology  Procedures:  Antimicrobials:  Cefazolin  Data Reviewed: I have personally reviewed following labs and imaging studies  CBC: Recent Labs  Lab 07/17/20 0534 07/17/20 1227 07/18/20 0115 07/18/20 0555 07/18/20 1216  WBC 6.0 6.3 7.6 7.1 6.7  NEUTROABS  --  4.5 5.7  --   --   HGB 5.7* 6.5*  6.6* 8.1* 8.0* 8.3*  HCT 18.3* 20.6*  20.9* 24.9* 25.7* 25.9*  MCV 97.9 96.7 94.7 95.9 94.9  PLT 97* 80* 79* 77* 73*   Basic Metabolic Panel: Recent Labs  Lab 07/16/20 2016 07/17/20 0534 07/18/20 0115  NA 130* 130* 134*  K 4.3 4.5 3.7  CL 93* 96* 100  CO2 22 21* 21*  GLUCOSE 123* 148* 120*  BUN 70* 73* 72*  CREATININE 3.15* 2.99* 2.41*  CALCIUM 8.8* 8.6* 8.9  PHOS  --   --  5.1*   GFR: Estimated Creatinine Clearance: 24.6 mL/min (A) (by C-G formula based on SCr of 2.41 mg/dL (H)). Liver Function Tests: Recent Labs  Lab 07/16/20 2016 07/17/20 0534  07/18/20 0115 07/18/20 1216  AST 30 25  --   --   ALT 12 11  --   --   ALKPHOS 64 57  --   --   BILITOT 2.6* 2.5*  --  2.6*  PROT 7.0 6.7  --   --   ALBUMIN 2.8* 2.7* 2.9*  --    Recent Labs  Lab 07/16/20 2016  LIPASE 32   Recent Labs  Lab 07/18/20 0837  AMMONIA 17   Coagulation Profile: Recent Labs  Lab 07/17/20 0018 07/17/20 0534  INR 1.5*  1.5*   Cardiac Enzymes: Recent Labs  Lab 07/16/20 2016  CKTOTAL 266*   BNP (last 3 results) No results for input(s): PROBNP in the last 8760 hours. HbA1C: No results for input(s): HGBA1C in the last 72 hours. CBG: Recent Labs  Lab 07/18/20 0022  GLUCAP 112*   Lipid Profile: Recent Labs    07/17/20 0534 07/17/20 1237  CHOL 76  --   HDL 13*  --   LDLCALC NOT CALCULATED  --   TRIG 69  --   CHOLHDL 5.8  --   LDLDIRECT  --  46.4   Thyroid Function Tests: Recent Labs    07/17/20 1600  TSH 3.567   Anemia Panel: Recent Labs    07/16/20 2016 07/17/20 0534 07/17/20 1432  VITAMINB12 659  --   --   FOLATE  --  4.0*  --   FERRITIN  --  108  --   TIBC  --  389  --   IRON  --  24*  --   RETICCTPCT  --   --  6.2*   Sepsis Labs: Recent Labs  Lab 07/16/20 2016 07/17/20 0534 07/17/20 0901 07/17/20 1227 07/18/20 0115  PROCALCITON 0.35 0.34  --   --  0.27  LATICACIDVEN 3.2*  --  2.5* 1.9  --     Recent Results (from the past 240 hour(s))  Blood culture (single)     Status: Abnormal (Preliminary result)   Collection Time: 07/16/20  8:07 PM   Specimen: BLOOD  Result Value Ref Range Status   Specimen Description   Final    BLOOD LEFT ANTECUBITAL Performed at [facility], [address], [place], [place] [address]    Special Requests   Final    BOTTLES DRAWN AEROBIC AND ANAEROBIC Blood Culture results may not be optimal due to an inadequate volume of blood received in culture bottles Performed at [facility], [address] [place], [place] [address]    Culture  Setup Time   Final     GRAM POSITIVE COCCI IN BOTH AEROBIC AND ANAEROBIC BOTTLES CRITICAL RESULT CALLED TO, READ BACK BY AND VERIFIED WITH: DEVIN MITCHELL 1200 07/17/2020 DLB    Culture (A)  Final    STAPHYLOCOCCUS EPIDERMIDIS THE SIGNIFICANCE OF ISOLATING THIS ORGANISM FROM A SINGLE VENIPUNCTURE CANNOT BE PREDICTED WITHOUT FURTHER CLINICAL AND CULTURE CORRELATION. SUSCEPTIBILITIES AVAILABLE ONLY ON REQUEST. Performed at [facility], [address] [place], [place] [address]    Report Status PENDING  Incomplete  Blood Culture ID Panel (Reflexed)     Status: Abnormal   Collection Time: 07/16/20  8:07 PM  Result Value Ref Range Status   Enterococcus faecalis NOT DETECTED NOT DETECTED Final   Enterococcus Faecium NOT DETECTED NOT DETECTED Final   Listeria monocytogenes NOT DETECTED NOT DETECTED Final   Staphylococcus species DETECTED (A) NOT DETECTED Final    Comment: CRITICAL RESULT CALLED TO, READ BACK BY AND VERIFIED WITH: DEVIN MITCHELL 1200 07/17/2020 DLB    Staphylococcus aureus (BCID) NOT DETECTED NOT DETECTED Final   Staphylococcus epidermidis DETECTED (A) NOT DETECTED Final    Comment: CRITICAL RESULT CALLED TO, READ BACK BY AND VERIFIED WITH: DEVIN MITCHELL 1200 07/17/2020 DLB    Staphylococcus lugdunensis NOT DETECTED NOT DETECTED Final   Streptococcus species NOT DETECTED NOT DETECTED Final   Streptococcus agalactiae NOT DETECTED NOT DETECTED Final   Streptococcus pneumoniae NOT DETECTED NOT DETECTED Final   Streptococcus pyogenes NOT DETECTED NOT DETECTED Final  A.calcoaceticus-baumannii NOT DETECTED NOT DETECTED Final   Bacteroides fragilis NOT DETECTED NOT DETECTED Final   Enterobacterales NOT DETECTED NOT DETECTED Final   Enterobacter cloacae complex NOT DETECTED NOT DETECTED Final   Escherichia coli NOT DETECTED NOT DETECTED Final   Klebsiella aerogenes NOT DETECTED NOT DETECTED Final   Klebsiella oxytoca NOT DETECTED NOT DETECTED Final   Klebsiella pneumoniae NOT DETECTED NOT DETECTED  Final   Proteus species NOT DETECTED NOT DETECTED Final   Salmonella species NOT DETECTED NOT DETECTED Final   Serratia marcescens NOT DETECTED NOT DETECTED Final   Haemophilus influenzae NOT DETECTED NOT DETECTED Final   Neisseria meningitidis NOT DETECTED NOT DETECTED Final   Pseudomonas aeruginosa NOT DETECTED NOT DETECTED Final   Stenotrophomonas maltophilia NOT DETECTED NOT DETECTED Final   Candida albicans NOT DETECTED NOT DETECTED Final   Candida auris NOT DETECTED NOT DETECTED Final   Candida glabrata NOT DETECTED NOT DETECTED Final   Candida krusei NOT DETECTED NOT DETECTED Final   Candida parapsilosis NOT DETECTED NOT DETECTED Final   Candida tropicalis NOT DETECTED NOT DETECTED Final   Cryptococcus neoformans/gattii NOT DETECTED NOT DETECTED Final   Methicillin resistance mecA/C NOT DETECTED NOT DETECTED Final    Comment: Performed at [facility], [address] [place], [place] [address]  Resp Panel by RT-PCR (Flu A&B, Covid) Nasopharyngeal Swab     Status: None   Collection Time: 07/16/20  8:16 PM   Specimen: Nasopharyngeal Swab; Nasopharyngeal(NP) swabs in vial transport medium  Result Value Ref Range Status   SARS Coronavirus 2 by RT PCR NEGATIVE NEGATIVE Final    Comment: (NOTE) SARS-CoV-2 target nucleic acids are NOT DETECTED.  The SARS-CoV-2 RNA is generally detectable in upper respiratory specimens during the acute phase of infection. The lowest concentration of SARS-CoV-2 viral copies this assay can detect is 138 copies/mL. A negative result does not preclude SARS-Cov-2 infection and should not be used as the sole basis for treatment or other patient management decisions. A negative result may occur with  improper specimen collection/handling, submission of specimen other than nasopharyngeal swab, presence of viral mutation(s) within the areas targeted by this assay, and inadequate number of viral copies(<138 copies/mL). A negative result must be  combined with clinical observations, patient history, and epidemiological information. The expected result is Negative.  Fact Sheet for Patients:  BloggerCourse.comhttps://www.fda.gov/media/152166/download  Fact Sheet for Healthcare Providers:  SeriousBroker.ithttps://www.fda.gov/media/152162/download  This test is no t yet approved or cleared by the [place] FDA and  has been authorized for detection and/or diagnosis of SARS-CoV-2 by FDA under an Emergency Use Authorization (EUA). This EUA will remain  in effect (meaning this test can be used) for the duration of the COVID-19 declaration under Section 564(b)(1) of the Act, 21 U.S.C.section 360bbb-3(b)(1), unless the authorization is terminated  or revoked sooner.       Influenza A by PCR NEGATIVE NEGATIVE Final   Influenza B by PCR NEGATIVE NEGATIVE Final    Comment: (NOTE) The Xpert Xpress SARS-CoV-2/FLU/RSV plus assay is intended as an aid in the diagnosis of influenza from Nasopharyngeal swab specimens and should not be used as a sole basis for treatment. Nasal washings and aspirates are unacceptable for Xpert Xpress SARS-CoV-2/FLU/RSV testing.  Fact Sheet for Patients: BloggerCourse.comhttps://www.fda.gov/media/152166/download  Fact Sheet for Healthcare Providers: SeriousBroker.ithttps://www.fda.gov/media/152162/download  This test is not yet approved or cleared by the [place] FDA and has been authorized for detection and/or diagnosis of SARS-CoV-2 by FDA under an Emergency Use Authorization (EUA). This EUA will  remain in effect (meaning this test can be used) for the duration of the COVID-19 declaration under Section 564(b)(1) of the Act, 21 U.S.C. section 360bbb-3(b)(1), unless the authorization is terminated or revoked.  Performed at [facility], [address] [place], [place] [address]   MRSA PCR Screening     Status: None   Collection Time: 07/18/20 12:38 AM   Specimen: Nasal Mucosa; Nasopharyngeal  Result Value Ref Range Status   MRSA by PCR  NEGATIVE NEGATIVE Final    Comment:        The GeneXpert MRSA Assay (FDA approved for NASAL specimens only), is one component of a comprehensive MRSA colonization surveillance program. It is not intended to diagnose MRSA infection nor to guide or monitor treatment for MRSA infections. Performed at [facility], [address], [place], [place] [address]      Radiology Studies: CT Head Wo Contrast  Result Date: 07/16/2020 CLINICAL DATA:  Dizziness EXAM: CT HEAD WITHOUT CONTRAST TECHNIQUE: Contiguous axial images were obtained from the base of the skull through the vertex without intravenous contrast. COMPARISON:  None. FINDINGS: Brain: Areas of low-density noted in both frontal lobes concerning for acute to subacute infarcts. No hemorrhage or hydrocephalus. Vascular: No hyperdense vessel or unexpected calcification. Skull: No acute calvarial abnormality. Sinuses/Orbits: No acute findings Other: None IMPRESSION: Areas of low-density in both frontal lobes concerning for acute to subacute infarcts. Electronically Signed   By: Charlett Nose M.D.   On: 07/16/2020 22:08   MR ANGIO HEAD WO CONTRAST  Result Date: 07/17/2020 CLINICAL DATA:  Subarachnoid hemorrhage EXAM: MRA HEAD WITHOUT CONTRAST TECHNIQUE: Angiographic images of the Circle of Willis were acquired using MRA technique without intravenous contrast. COMPARISON:  No pertinent prior exam. FINDINGS: POSTERIOR CIRCULATION: --Vertebral arteries: Normal --Inferior cerebellar arteries: Normal. --Basilar artery: Normal. --Superior cerebellar arteries: Normal. --Posterior cerebral arteries: Normal. ANTERIOR CIRCULATION: --Intracranial internal carotid arteries: Normal. --Anterior cerebral arteries (ACA): Normal. --Middle cerebral arteries (MCA): Normal. ANATOMIC VARIANTS: None IMPRESSION: Normal intracranial MRA. Electronically Signed   By: Deatra Robinson M.D.   On: 07/17/2020 02:39   MR Angiogram Neck W or Wo Contrast  Result Date:  07/17/2020 CLINICAL DATA:  Encephalopathy EXAM: MRA NECK WITHOUT AND WITH CONTRAST TECHNIQUE: Multiplanar and multiecho pulse sequences of the neck were obtained without and with intravenous contrast. Angiographic images of the neck were obtained using MRA technique without and with intravenous contrast. CONTRAST:  51mL GADAVIST GADOBUTROL 1 MMOL/ML IV SOLN COMPARISON:  None. FINDINGS: Images are degraded by motion. Within that limitation, there is no hemodynamically significant stenosis visualized within the carotid systems. The vertebral arteries are left-dominant. Both vertebral arteries are normal. IMPRESSION: Motion degraded MRA of the neck without visible stenosis or acute abnormality of the carotid or vertebral arteries. Electronically Signed   By: Deatra Robinson M.D.   On: 07/17/2020 03:59   MR BRAIN WO CONTRAST  Result Date: 07/17/2020 CLINICAL DATA:  Encephalopathy EXAM: MRI HEAD WITHOUT CONTRAST TECHNIQUE: Multiplanar, multiecho pulse sequences of the brain and surrounding structures were obtained without intravenous contrast. COMPARISON:  None. FINDINGS: Brain: Multifocal bilateral acute ischemia. The largest areas of infarction are in the frontal lobes, but there are areas of ischemia in both parietal lobes, both occipital lobes and the right greater than left cerebellar hemispheres. No acute or chronic hemorrhage. There is multifocal hyperintense T2-weighted signal within the white matter. Parenchymal volume and CSF spaces are normal. The midline structures are normal. Vascular: Major flow voids are preserved. Skull and upper  cervical spine: Normal calvarium and skull base. Visualized upper cervical spine and soft tissues are normal. Sinuses/Orbits:Left maxillary sinus mucosal thickening. No mastoid or middle ear effusion. Normal orbits. IMPRESSION: 1. Multifocal bilateral acute ischemia. The largest areas are in the frontal lobes, but there are lesions in multiple vascular territories bilaterally.  This is most consistent with a cardiac or aortic embolic source. 2. No hemorrhage or mass effect. Electronically Signed   By: Deatra Robinson M.D.   On: 07/17/2020 02:33   US Abdomen Complete  Result Date: 07/17/2020 CLINICAL DATA:  Hyperbilirubinemia EXAM: ABDOMEN ULTRASOUND COMPLETE COMPARISON:  07/16/2020, 07/17/2020 FINDINGS: Gallbladder: Surgically absent Common bile duct: Diameter: 2 mm Liver: No focal lesion identified. Within normal limits in parenchymal echogenicity. Portal vein is patent on color Doppler imaging with normal direction of blood flow towards the liver. IVC: No abnormality visualized. Pancreas: Visualized portion unremarkable. Spleen: Size and appearance within normal limits. Right Kidney: Length: 9.3 cm. Echogenicity within normal limits. No mass or hydronephrosis visualized. Left Kidney: Length: 10.4 cm. Echogenicity within normal limits. No mass or hydronephrosis visualized. Abdominal aorta: Proximal aorta is unremarkable measuring up to 2.7 cm. Distal aorta and bifurcation obscured by bowel gas. Other findings: None. IMPRESSION: 1. Grossly unremarkable abdominal ultrasound in this patient status post cholecystectomy. Electronically Signed   By: Sharlet Salina M.D.   On: 07/17/2020 23:30   US RENAL  Result Date: 07/17/2020 CLINICAL DATA:  Acute kidney injury EXAM: RENAL / URINARY TRACT ULTRASOUND COMPLETE COMPARISON:  None. FINDINGS: Right Kidney: Renal measurements: 10.3 x 3.8 x 4.4 cm = volume: 90 mL. Echogenicity within normal limits. No mass or hydronephrosis visualized. Left Kidney: Renal measurements: 9.6 x 4.8 x 4.3 cm = volume: 95 mL. Echogenicity within normal limits. No mass or hydronephrosis visualized. Bladder: Appears normal for degree of bladder distention. Other: None. IMPRESSION: No evidence of hydronephrosis or other acute renal abnormality. Electronically Signed   By: Malachy Moan M.D.   On: 07/17/2020 15:29   US Venous Img Lower Bilateral (DVT)  Result Date:  07/18/2020 CLINICAL DATA:  Bilateral lower extremity swelling for the past 3 weeks. Evaluate for DVT. EXAM: BILATERAL LOWER EXTREMITY VENOUS DOPPLER ULTRASOUND TECHNIQUE: Gray-scale sonography with graded compression, as well as color Doppler and duplex ultrasound were performed to evaluate the lower extremity deep venous systems from the level of the common femoral vein and including the common femoral, femoral, profunda femoral, popliteal and calf veins including the posterior tibial, peroneal and gastrocnemius veins when visible. The superficial great saphenous vein was also interrogated. Spectral Doppler was utilized to evaluate flow at rest and with distal augmentation maneuvers in the common femoral, femoral and popliteal veins. COMPARISON:  None. FINDINGS: Examination is degraded due to patient body habitus and poor sonographic window. RIGHT LOWER EXTREMITY Common Femoral Vein: No evidence of thrombus. Normal compressibility, respiratory phasicity and response to augmentation. Saphenofemoral Junction: No evidence of thrombus. Normal compressibility and flow on color Doppler imaging. Profunda Femoral Vein: No evidence of thrombus. Normal compressibility and flow on color Doppler imaging. Femoral Vein: No evidence of thrombus. Normal compressibility, respiratory phasicity and response to augmentation. Popliteal Vein: No evidence of thrombus. Normal compressibility, respiratory phasicity and response to augmentation. Calf Veins: No evidence of thrombus. Normal compressibility and flow on color Doppler imaging. Superficial Great Saphenous Vein: No evidence of thrombus. Normal compressibility. Venous Reflux:  None. Other Findings:  None. LEFT LOWER EXTREMITY Common Femoral Vein: No evidence of thrombus. Normal compressibility, respiratory phasicity and response to augmentation. Saphenofemoral  Junction: No evidence of thrombus. Normal compressibility and flow on color Doppler imaging. Profunda Femoral Vein: No  evidence of thrombus. Normal compressibility and flow on color Doppler imaging. Femoral Vein: No evidence of thrombus. Normal compressibility, respiratory phasicity and response to augmentation. Popliteal Vein: No evidence of thrombus. Normal compressibility, respiratory phasicity and response to augmentation. Calf Veins: No evidence of thrombus. Normal compressibility and flow on color Doppler imaging. Superficial Great Saphenous Vein: No evidence of thrombus. Normal compressibility. Venous Reflux:  None. Other Findings:  None. IMPRESSION: No evidence of DVT within either lower extremity. Electronically Signed   By: Simonne Come M.D.   On: 07/18/2020 14:41   US Venous Img Upper Bilat (DVT)  Result Date: 07/18/2020 CLINICAL DATA:  Bilateral upper extremity pain and edema for the past several weeks. Evaluate for DVT. EXAM: BILATERAL UPPER EXTREMITY VENOUS DOPPLER ULTRASOUND TECHNIQUE: Gray-scale sonography with graded compression, as well as color Doppler and duplex ultrasound were performed to evaluate the bilateral upper extremity deep venous systems from the level of the subclavian vein and including the jugular, axillary, basilic, radial, ulnar and upper cephalic vein. Spectral Doppler was utilized to evaluate flow at rest and with distal augmentation maneuvers. COMPARISON:  None. FINDINGS: RIGHT UPPER EXTREMITY Internal Jugular Vein: No evidence of thrombus. Normal compressibility, respiratory phasicity and response to augmentation. Subclavian Vein: No evidence of thrombus. Normal compressibility, respiratory phasicity and response to augmentation. Axillary Vein: No evidence of thrombus. Normal compressibility, respiratory phasicity and response to augmentation. Cephalic Vein: No evidence of thrombus. Normal compressibility, respiratory phasicity and response to augmentation. Basilic Vein: No evidence of thrombus. Normal compressibility, respiratory phasicity and response to augmentation. Brachial Veins: No  evidence of thrombus. Normal compressibility, respiratory phasicity and response to augmentation. Radial Veins: No evidence of thrombus. Normal compressibility, respiratory phasicity and response to augmentation. Ulnar Veins: No evidence of thrombus. Normal compressibility, respiratory phasicity and response to augmentation. Venous Reflux:  None. Other Findings:  None. LEFT UPPER EXTREMITY Internal Jugular Vein: No evidence of thrombus. Normal compressibility, respiratory phasicity and response to augmentation. Subclavian Vein: No evidence of thrombus. Normal compressibility, respiratory phasicity and response to augmentation. Axillary Vein: No evidence of thrombus. Normal compressibility, respiratory phasicity and response to augmentation. Cephalic Vein: While the proximal aspect of the cephalic vein appears widely patent (image 9), there is hypoechoic occlusive thrombus involving the mid aspect of the cephalic vein at the level of the mid humerus (images 10 and 12). Basilic Vein: No evidence of thrombus. Normal compressibility, respiratory phasicity and response to augmentation. Brachial Veins: No evidence of thrombus. Normal compressibility, respiratory phasicity and response to augmentation. Radial Veins: No evidence of thrombus. Normal compressibility, respiratory phasicity and response to augmentation. Ulnar Veins: No evidence of thrombus. Normal compressibility, respiratory phasicity and response to augmentation. Venous Reflux:  None. Other Findings:  None. IMPRESSION: 1. No evidence of DVT within either upper extremity. 2. Examination is positive for short-segment occlusive superficial thrombophlebitis involving mid humeral aspect of the cephalic vein. Electronically Signed   By: Simonne Come M.D.   On: 07/18/2020 14:40   DG Chest Port 1 View  Result Date: 07/18/2020 CLINICAL DATA:  Acute respiratory failure with hypoxia EXAM: PORTABLE CHEST 1 VIEW COMPARISON:  07/16/2020 FINDINGS: Single frontal view of  the chest demonstrates an enlarged cardiac silhouette. Stable diffuse ground-glass airspace disease compatible with edema. No effusion or pneumothorax. No acute bony abnormalities. IMPRESSION: 1. Persistent bilateral ground-glass airspace disease consistent with pulmonary edema or atypical infection. No change since prior exam.  Electronically Signed   By: Sharlet Salina M.D.   On: 07/18/2020 01:52   DG Chest Portable 1 View  Result Date: 07/16/2020 CLINICAL DATA:  Shortness of breath EXAM: PORTABLE CHEST 1 VIEW COMPARISON:  None. FINDINGS: Low lung volumes. Mild cardiomegaly. Diffuse interstitial prominence throughout the lungs. No effusions or pneumothorax. No acute bony abnormality. IMPRESSION: Cardiomegaly. Diffuse interstitial prominence could reflect interstitial edema or less likely atypical infection. Electronically Signed   By: Charlett Nose M.D.   On: 07/16/2020 20:41   ECHOCARDIOGRAM COMPLETE  Result Date: 07/17/2020    ECHOCARDIOGRAM REPORT   Patient Name:   EMALEIGH GUIMOND Date of Exam: 07/17/2020 Medical Rec #:  [id number]       Height:       57.0 in Accession #:    [id number]      Weight:       231.5 lb Date of Birth:  1965-11-28        BSA:          1.914 m Patient Age:    55 years        BP:           106/49 mmHg Patient Gender: F               HR:           71 bpm. Exam Location:  [facility] Procedure: 2D Echo, Color Doppler, Cardiac Doppler and Intracardiac            Opacification Agent Indications:     I63.9 Stroke  History:         Patient has no prior history of Echocardiogram examinations.                  Signs/Symptoms:Shortness of Breath; Risk Factors:Sleep Apnea                  and Current Smoker.  Sonographer:     Humphrey Rolls RDCS (AE) Referring Phys:  [id number] Cecille Po MELVIN Diagnosing Phys: Lorine Bears MD  Sonographer Comments: Suboptimal apical window. Image acquisition challenging due to patient body habitus and Image acquisition challenging due to uncooperative patient. IMPRESSIONS   1. Left ventricular ejection fraction, by estimation, is 50 to 55%. The left ventricle has low normal function. The left ventricle has no regional wall motion abnormalities. Left ventricular diastolic parameters are indeterminate.  2. Right ventricular systolic function is normal. The right ventricular size is normal. Tricuspid regurgitation signal is inadequate for assessing PA pressure.  3. Left atrial size was mildly dilated.  4. Right atrial size was mildly dilated.  5. The mitral valve is normal in structure. No evidence of mitral valve regurgitation. No evidence of mitral stenosis.  6. The aortic valve is normal in structure. Aortic valve regurgitation is mild. Mild aortic valve stenosis.  7. The inferior vena cava is dilated in size with <50% respiratory variability, suggesting right atrial pressure of 15 mmHg. FINDINGS  Left Ventricle: Left ventricular ejection fraction, by estimation, is 50 to 55%. The left ventricle has low normal function. The left ventricle has no regional wall motion abnormalities. Definity contrast agent was given IV to delineate the left ventricular endocardial borders. The left ventricular internal cavity size was normal in size. There is no left ventricular hypertrophy. Left ventricular diastolic parameters are indeterminate. Right Ventricle: The right ventricular size is normal. No increase in right ventricular wall thickness. Right ventricular systolic function is normal. Tricuspid regurgitation signal is inadequate for assessing PA  pressure. Left Atrium: Left atrial size was mildly dilated. Right Atrium: Right atrial size was mildly dilated. Pericardium: There is no evidence of pericardial effusion. Mitral Valve: The mitral valve is normal in structure. No evidence of mitral valve regurgitation. No evidence of mitral valve stenosis. MV peak gradient, 3.7 mmHg. The mean mitral valve gradient is 2.0 mmHg. Tricuspid Valve: The tricuspid valve is normal in structure. Tricuspid valve  regurgitation is mild . No evidence of tricuspid stenosis. Aortic Valve: The aortic valve is normal in structure. Aortic valve regurgitation is mild. Mild aortic stenosis is present. Aortic valve mean gradient measures 13.0 mmHg. Aortic valve peak gradient measures 20.8 mmHg. Aortic valve area, by VTI measures 1.59 cm. Pulmonic Valve: The pulmonic valve was normal in structure. Pulmonic valve regurgitation is trivial. No evidence of pulmonic stenosis. Aorta: The aortic root is normal in size and structure. Venous: The inferior vena cava is dilated in size with less than 50% respiratory variability, suggesting right atrial pressure of 15 mmHg. IAS/Shunts: No atrial level shunt detected by color flow Doppler.  LEFT VENTRICLE PLAX 2D LVIDd:         4.90 cm  Diastology LVIDs:         3.40 cm  LV e' medial:    7.51 cm/s LV PW:         1.10 cm  LV E/e' medial:  12.0 LV IVS:        0.80 cm  LV e' lateral:   7.72 cm/s LVOT diam:     2.00 cm  LV E/e' lateral: 11.7 LV SV:         65 LV SV Index:   34 LVOT Area:     3.14 cm  RIGHT VENTRICLE RV Basal diam:  3.50 cm LEFT ATRIUM             Index       RIGHT ATRIUM           Index LA diam:        4.50 cm 2.35 cm/m  RA Area:     21.50 cm LA Vol (A2C):   47.4 ml 24.77 ml/m RA Volume:   65.60 ml  34.27 ml/m LA Vol (A4C):   66.3 ml 34.64 ml/m LA Biplane Vol: 58.2 ml 30.41 ml/m  AORTIC VALVE                    PULMONIC VALVE AV Area (Vmax):    1.37 cm     PV Vmax:       1.10 m/s AV Area (Vmean):   1.20 cm     PV Vmean:      67.600 cm/s AV Area (VTI):     1.59 cm     PV VTI:        0.185 m AV Vmax:           228.00 cm/s  PV Peak grad:  4.8 mmHg AV Vmean:          168.000 cm/s PV Mean grad:  2.0 mmHg AV VTI:            0.407 m AV Peak Grad:      20.8 mmHg AV Mean Grad:      13.0 mmHg LVOT Vmax:         99.20 cm/s LVOT Vmean:        64.100 cm/s LVOT VTI:          0.206 m LVOT/AV VTI ratio: 0.51  AORTA Ao Root diam: 3.10 cm MITRAL VALVE MV Area (PHT): 4.89 cm    SHUNTS MV Area  VTI:   2.10 cm    Systemic VTI:  0.21 m MV Peak grad:  3.7 mmHg    Systemic Diam: 2.00 cm MV Mean grad:  2.0 mmHg MV Vmax:       0.96 m/s MV Vmean:      64.1 cm/s MV Decel Time: 155 msec MV E velocity: 90.00 cm/s MV A velocity: 87.00 cm/s MV E/A ratio:  1.03 Lorine Bears MD Electronically signed by Lorine Bears MD Signature Date/Time: 07/17/2020/5:00:35 PM    Final    CT CHEST ABDOMEN PELVIS WO CONTRAST  Result Date: 07/16/2020 CLINICAL DATA:  Shortness of breath. EXAM: CT CHEST, ABDOMEN AND PELVIS WITHOUT CONTRAST TECHNIQUE: Multidetector CT imaging of the chest, abdomen and pelvis was performed following the standard protocol without IV contrast. COMPARISON:  None. FINDINGS: CT CHEST FINDINGS Cardiovascular: Heart is mildly enlarged.  Aorta normal caliber. Mediastinum/Nodes: No mediastinal, hilar, or axillary adenopathy. Trachea and esophagus are unremarkable. Thyroid unremarkable. Lungs/Pleura: Ground-glass airspace opacities within the upper lobes bilaterally, left greater than right as well as left lower lobe. No effusions or pneumothorax. Musculoskeletal: Chest wall soft tissues are unremarkable. No acute bony abnormality. CT ABDOMEN PELVIS FINDINGS Hepatobiliary: No focal liver abnormality is seen. Status post cholecystectomy. No biliary dilatation. Pancreas: No focal abnormality or ductal dilatation. Spleen: No focal abnormality.  Normal size. Adrenals/Urinary Tract: Fullness of the left adrenal gland compatible with hyperplasia. Right adrenal gland and kidneys unremarkable. No stones or hydronephrosis. Stomach/Bowel: Stomach, large and small bowel grossly unremarkable. Vascular/Lymphatic: Aortic atherosclerosis. No evidence of aneurysm or adenopathy. Reproductive: Prior hysterectomy.  No adnexal masses. Other: No free fluid or free air. Musculoskeletal: No acute bony abnormality. IMPRESSION: Cardiomegaly. Mild ground-glass opacities in the lungs, left greater than right. This could reflect asymmetric  edema or infection. No acute findings in the abdomen or pelvis. Aortic atherosclerosis. Electronically Signed   By: Charlett Nose M.D.   On: 07/16/2020 22:13    Scheduled Meds: .  stroke: mapping our early stages of recovery book   Does not apply Once  . budesonide (PULMICORT) nebulizer solution  0.5 mg Nebulization BID  . Chlorhexidine Gluconate Cloth  6 each Topical Q0600  . ipratropium-albuterol  3 mL Nebulization Q6H  . nicotine  21 mg Transdermal Daily  . sodium chloride flush  3 mL Intravenous Q12H   Continuous Infusions: .  ceFAZolin (ANCEF) IV 1 g (07/18/20 1400)     LOS: 1 day   Time spent: 45 minutes. More than 50% of the time was spent in counseling/coordination of care. Patient is critically sick with multiorgan dysfunction and high risk for deterioration and death.  Arnetha Courser, MD [facility]  If 7PM-7AM, please contact night-coverage [email or web address]  07/18/2020, 2:52 PM   This record has been created using Conservation officer, historic buildings. Errors have been sought and corrected,but may not always be located. Such creation errors do not reflect on the standard of care.

## 2020-07-18 NOTE — ED Notes (Signed)
Patient resting comfortably

## 2020-07-18 NOTE — Progress Notes (Signed)
PT Cancellation Note  Patient Details Name: Brandi Holland MRN: [id number] DOB: 21-Feb-1965   Cancelled Treatment:    Reason Eval/Treat Not Completed: Medical issues which prohibited therapy;Patient not medically ready (Since PT order place, pt has transfered to ICU. Respiratory status continues to worse in past 24 hours. Will DC our order and await new consult once pt is medically ready.)   Abcde Oneil C 07/18/2020, 9:38 AM

## 2020-07-18 NOTE — Progress Notes (Signed)
Hematology/Oncology Progress Note [facility] [phone] [phone]  Patient Care Team: Pcp, No as PCP - General   Name of the patient: Lydia Toren  [id number]  [birth year]  Date of visit: 07/18/20   INTERVAL HISTORY-  She appears more alert but remained very confused. She does not answer my questions or follow verbal commands.   Review of systems- Review of Systems  Unable to perform ROS: Mental status change    Allergies  Allergen Reactions  . Clarithromycin Swelling  . Sulfa Antibiotics Swelling    Patient Active Problem List   Diagnosis Date Noted  . Acute on chronic diastolic heart failure (HCC)   . Acute encephalopathy 07/17/2020  . CVA (cerebral vascular accident) (HCC) 07/17/2020  . Asthma 07/17/2020  . Acute respiratory failure with hypoxia (HCC) 07/17/2020  . AKI (acute kidney injury) (HCC) 07/17/2020  . Hyponatremia 07/17/2020  . OSA (obstructive sleep apnea) 07/17/2020  . AMS (altered mental status) 07/17/2020  . Sepsis (HCC)   . Anemia   . Thrombocytopenia (HCC)      Past Medical History:  Diagnosis Date  . Arthritis   . Asthma   . Fibromyalgia   . OSA (obstructive sleep apnea)   . Peripheral neuropathy      History reviewed. No pertinent surgical history.  Social History   Socioeconomic History  . Marital status: Married    Spouse name: Not on file  . Number of children: Not on file  . Years of education: Not on file  . Highest education level: Not on file  Occupational History  . Not on file  Tobacco Use  . Smoking status: Not on file  . Smokeless tobacco: Not on file  Substance and Sexual Activity  . Alcohol use: Not on file  . Drug use: Not on file  . Sexual activity: Not on file  Other Topics Concern  . Not on file  Social History Narrative  . Not on file   Social Determinants of Health   Financial Resource Strain: Not on file  Food Insecurity: Not on file  Transportation  Needs: Not on file  Physical Activity: Not on file  Stress: Not on file  Social Connections: Not on file  Intimate Partner Violence: Not on file     Family History  Problem Relation Age of Onset  . Chorea Mother   . Emphysema Mother   . Hypertension Mother   . Thyroid disease Mother   . Ulcers Mother   . Dementia Father   . Cancer Father   . Cancer Sister   . Chorea Sister   . Heart disease Sister   . Osteoporosis Sister      Current Facility-Administered Medications:  .   stroke: mapping our early stages of recovery book, , Does not apply, Once, Synetta Fail, MD .  acetaminophen (TYLENOL) tablet 650 mg, 650 mg, Oral, Q6H PRN **OR** acetaminophen (TYLENOL) suppository 650 mg, 650 mg, Rectal, Q6H PRN, Synetta Fail, MD .  bisacodyl (DULCOLAX) suppository 10 mg, 10 mg, Rectal, Daily PRN, Synetta Fail, MD .  budesonide (PULMICORT) nebulizer solution 0.5 mg, 0.5 mg, Nebulization, BID, Harlon Ditty D, NP, 0.5 mg at 07/18/20 0840 .  ceFAZolin (ANCEF) IVPB 2g/100 mL premix, 2 g, Intravenous, Q12H, Aleda Grana, RPH .  Chlorhexidine Gluconate Cloth 2 % PADS 6 each, 6 each, Topical, Q0600, Jimmye Norman, NP, 6 each at 07/18/20 0500 .  ipratropium-albuterol (DUONEB) 0.5-2.5 (3) MG/3ML nebulizer solution 3  mL, 3 mL, Nebulization, Q2H PRN, Synetta Fail, MD, 3 mL at 07/18/20 0622 .  ipratropium-albuterol (DUONEB) 0.5-2.5 (3) MG/3ML nebulizer solution 3 mL, 3 mL, Nebulization, Q6H, Harlon Ditty D, NP, 3 mL at 07/18/20 1451 .  LORazepam (ATIVAN) injection 0.5 mg, 0.5 mg, Intravenous, Q6H PRN, Arnetha Courser, MD, 0.5 mg at 07/17/20 1138 .  nicotine (NICODERM CQ - dosed in mg/24 hours) patch 21 mg, 21 mg, Transdermal, Daily, Kasa, Kurian, MD, 21 mg at 07/18/20 1258 .  sodium chloride flush (NS) 0.9 % injection 3 mL, 3 mL, Intravenous, Q12H, Synetta Fail, MD, 3 mL at 07/18/20 1008   Physical exam:  Vitals:   07/18/20 1451 07/18/20 1500  07/18/20 1600 07/18/20 1700  BP:  (!) 125/39 130/70 (!) 142/86  Pulse: 82 82 82 82  Resp: (!) 26 18 20    Temp:   98.5 F (36.9 C)   TempSrc:   Axillary   SpO2: 95% 97% 98% 97%  Weight:      Height:       Physical Exam Constitutional:      General: She is not in acute distress.    Appearance: She is not diaphoretic.  HENT:     Head: Normocephalic and atraumatic.     Nose: Nose normal.     Mouth/Throat:     Pharynx: No oropharyngeal exudate.  Eyes:     General: No scleral icterus.    Pupils: Pupils are equal, round, and reactive to light.  Cardiovascular:     Rate and Rhythm: Normal rate.     Heart sounds: No murmur heard.   Pulmonary:     Effort: Pulmonary effort is normal. No respiratory distress.  Abdominal:     General: There is no distension.     Palpations: Abdomen is soft.     Tenderness: There is no abdominal tenderness.  Skin:    General: Skin is warm and dry.     Findings: No erythema.  Neurological:     Mental Status: She is alert.     Motor: No abnormal muscle tone.     Comments: More alert today, some eye contact.  Does not follow verbal commands.  She moves her extremities  Psychiatric:        Mood and Affect: Affect normal.        CMP Latest Ref Rng & Units 07/18/2020  Glucose 70 - 99 mg/dL 09/17/2020)  BUN 6 - 20 mg/dL 562(B)  Creatinine 63(S - 1.00 mg/dL 9.37)  Sodium 3.42(A - 768 mmol/L 134(L)  Potassium 3.5 - 5.1 mmol/L 3.7  Chloride 98 - 111 mmol/L 100  CO2 22 - 32 mmol/L 21(L)  Calcium 8.9 - 10.3 mg/dL 8.9  Total Protein 6.5 - 8.1 g/dL -  Total Bilirubin 0.3 - 1.2 mg/dL 2.6(H)  Alkaline Phos 38 - 126 U/L -  AST 15 - 41 U/L -  ALT 0 - 44 U/L -   CBC Latest Ref Rng & Units 07/18/2020  WBC 4.0 - 10.5 K/uL 6.7  Hemoglobin 12.0 - 15.0 g/dL 8.3(L)  Hematocrit 36.0 - 46.0 % 25.9(L)  Platelets 150 - 400 K/uL 73(L)    RADIOGRAPHIC STUDIES: I have personally reviewed the radiological images as listed and agreed with the findings in the report. DG  Chest 2 View  Result Date: 06/18/2020 CLINICAL DATA:  55 year old female with complaint of bilateral leg swelling for the past several weeks. EXAM: CHEST - 2 VIEW COMPARISON:  Chest x-ray 06/18/2020. FINDINGS: Lung volumes are  normal. No consolidative airspace disease. There is cephalization of the pulmonary vasculature and slight indistinctness of the interstitial markings suggestive of mild pulmonary edema. No definite pleural effusions. No pneumothorax. No definite suspicious appearing pulmonary nodules or masses are noted. Mild cardiomegaly. Upper mediastinal contours are within normal limits. IMPRESSION: 1. The appearance of the chest suggests mild congestive heart failure, as above. Electronically Signed   By: Trudie Reed M.D.   On: 06/18/2020 19:11   CT Head Wo Contrast  Result Date: 07/16/2020 CLINICAL DATA:  Dizziness EXAM: CT HEAD WITHOUT CONTRAST TECHNIQUE: Contiguous axial images were obtained from the base of the skull through the vertex without intravenous contrast. COMPARISON:  None. FINDINGS: Brain: Areas of low-density noted in both frontal lobes concerning for acute to subacute infarcts. No hemorrhage or hydrocephalus. Vascular: No hyperdense vessel or unexpected calcification. Skull: No acute calvarial abnormality. Sinuses/Orbits: No acute findings Other: None IMPRESSION: Areas of low-density in both frontal lobes concerning for acute to subacute infarcts. Electronically Signed   By: Charlett Nose M.D.   On: 07/16/2020 22:08   MR ANGIO HEAD WO CONTRAST  Result Date: 07/17/2020 CLINICAL DATA:  Subarachnoid hemorrhage EXAM: MRA HEAD WITHOUT CONTRAST TECHNIQUE: Angiographic images of the Circle of Willis were acquired using MRA technique without intravenous contrast. COMPARISON:  No pertinent prior exam. FINDINGS: POSTERIOR CIRCULATION: --Vertebral arteries: Normal --Inferior cerebellar arteries: Normal. --Basilar artery: Normal. --Superior cerebellar arteries: Normal. --Posterior cerebral  arteries: Normal. ANTERIOR CIRCULATION: --Intracranial internal carotid arteries: Normal. --Anterior cerebral arteries (ACA): Normal. --Middle cerebral arteries (MCA): Normal. ANATOMIC VARIANTS: None IMPRESSION: Normal intracranial MRA. Electronically Signed   By: Deatra Robinson M.D.   On: 07/17/2020 02:39   MR Angiogram Neck W or Wo Contrast  Result Date: 07/17/2020 CLINICAL DATA:  Encephalopathy EXAM: MRA NECK WITHOUT AND WITH CONTRAST TECHNIQUE: Multiplanar and multiecho pulse sequences of the neck were obtained without and with intravenous contrast. Angiographic images of the neck were obtained using MRA technique without and with intravenous contrast. CONTRAST:  10mL GADAVIST GADOBUTROL 1 MMOL/ML IV SOLN COMPARISON:  None. FINDINGS: Images are degraded by motion. Within that limitation, there is no hemodynamically significant stenosis visualized within the carotid systems. The vertebral arteries are left-dominant. Both vertebral arteries are normal. IMPRESSION: Motion degraded MRA of the neck without visible stenosis or acute abnormality of the carotid or vertebral arteries. Electronically Signed   By: Deatra Robinson M.D.   On: 07/17/2020 03:59   MR BRAIN WO CONTRAST  Result Date: 07/17/2020 CLINICAL DATA:  Encephalopathy EXAM: MRI HEAD WITHOUT CONTRAST TECHNIQUE: Multiplanar, multiecho pulse sequences of the brain and surrounding structures were obtained without intravenous contrast. COMPARISON:  None. FINDINGS: Brain: Multifocal bilateral acute ischemia. The largest areas of infarction are in the frontal lobes, but there are areas of ischemia in both parietal lobes, both occipital lobes and the right greater than left cerebellar hemispheres. No acute or chronic hemorrhage. There is multifocal hyperintense T2-weighted signal within the white matter. Parenchymal volume and CSF spaces are normal. The midline structures are normal. Vascular: Major flow voids are preserved. Skull and upper cervical spine:  Normal calvarium and skull base. Visualized upper cervical spine and soft tissues are normal. Sinuses/Orbits:Left maxillary sinus mucosal thickening. No mastoid or middle ear effusion. Normal orbits. IMPRESSION: 1. Multifocal bilateral acute ischemia. The largest areas are in the frontal lobes, but there are lesions in multiple vascular territories bilaterally. This is most consistent with a cardiac or aortic embolic source. 2. No hemorrhage or mass effect. Electronically Signed  By: Deatra RobinsonKevin  Herman M.D.   On: 07/17/2020 02:33   US Abdomen Complete  Result Date: 07/17/2020 CLINICAL DATA:  Hyperbilirubinemia EXAM: ABDOMEN ULTRASOUND COMPLETE COMPARISON:  07/16/2020, 07/17/2020 FINDINGS: Gallbladder: Surgically absent Common bile duct: Diameter: 2 mm Liver: No focal lesion identified. Within normal limits in parenchymal echogenicity. Portal vein is patent on color Doppler imaging with normal direction of blood flow towards the liver. IVC: No abnormality visualized. Pancreas: Visualized portion unremarkable. Spleen: Size and appearance within normal limits. Right Kidney: Length: 9.3 cm. Echogenicity within normal limits. No mass or hydronephrosis visualized. Left Kidney: Length: 10.4 cm. Echogenicity within normal limits. No mass or hydronephrosis visualized. Abdominal aorta: Proximal aorta is unremarkable measuring up to 2.7 cm. Distal aorta and bifurcation obscured by bowel gas. Other findings: None. IMPRESSION: 1. Grossly unremarkable abdominal ultrasound in this patient status post cholecystectomy. Electronically Signed   By: Sharlet SalinaMichael  Brown M.D.   On: 07/17/2020 23:30   US RENAL  Result Date: 07/17/2020 CLINICAL DATA:  Acute kidney injury EXAM: RENAL / URINARY TRACT ULTRASOUND COMPLETE COMPARISON:  None. FINDINGS: Right Kidney: Renal measurements: 10.3 x 3.8 x 4.4 cm = volume: 90 mL. Echogenicity within normal limits. No mass or hydronephrosis visualized. Left Kidney: Renal measurements: 9.6 x 4.8 x 4.3 cm =  volume: 95 mL. Echogenicity within normal limits. No mass or hydronephrosis visualized. Bladder: Appears normal for degree of bladder distention. Other: None. IMPRESSION: No evidence of hydronephrosis or other acute renal abnormality. Electronically Signed   By: Malachy MoanHeath  McCullough M.D.   On: 07/17/2020 15:29   US Venous Img Lower Bilateral (DVT)  Result Date: 07/18/2020 CLINICAL DATA:  Bilateral lower extremity swelling for the past 3 weeks. Evaluate for DVT. EXAM: BILATERAL LOWER EXTREMITY VENOUS DOPPLER ULTRASOUND TECHNIQUE: Gray-scale sonography with graded compression, as well as color Doppler and duplex ultrasound were performed to evaluate the lower extremity deep venous systems from the level of the common femoral vein and including the common femoral, femoral, profunda femoral, popliteal and calf veins including the posterior tibial, peroneal and gastrocnemius veins when visible. The superficial great saphenous vein was also interrogated. Spectral Doppler was utilized to evaluate flow at rest and with distal augmentation maneuvers in the common femoral, femoral and popliteal veins. COMPARISON:  None. FINDINGS: Examination is degraded due to patient body habitus and poor sonographic window. RIGHT LOWER EXTREMITY Common Femoral Vein: No evidence of thrombus. Normal compressibility, respiratory phasicity and response to augmentation. Saphenofemoral Junction: No evidence of thrombus. Normal compressibility and flow on color Doppler imaging. Profunda Femoral Vein: No evidence of thrombus. Normal compressibility and flow on color Doppler imaging. Femoral Vein: No evidence of thrombus. Normal compressibility, respiratory phasicity and response to augmentation. Popliteal Vein: No evidence of thrombus. Normal compressibility, respiratory phasicity and response to augmentation. Calf Veins: No evidence of thrombus. Normal compressibility and flow on color Doppler imaging. Superficial Great Saphenous Vein: No evidence  of thrombus. Normal compressibility. Venous Reflux:  None. Other Findings:  None. LEFT LOWER EXTREMITY Common Femoral Vein: No evidence of thrombus. Normal compressibility, respiratory phasicity and response to augmentation. Saphenofemoral Junction: No evidence of thrombus. Normal compressibility and flow on color Doppler imaging. Profunda Femoral Vein: No evidence of thrombus. Normal compressibility and flow on color Doppler imaging. Femoral Vein: No evidence of thrombus. Normal compressibility, respiratory phasicity and response to augmentation. Popliteal Vein: No evidence of thrombus. Normal compressibility, respiratory phasicity and response to augmentation. Calf Veins: No evidence of thrombus. Normal compressibility and flow on color Doppler imaging. Superficial Great Saphenous  Vein: No evidence of thrombus. Normal compressibility. Venous Reflux:  None. Other Findings:  None. IMPRESSION: No evidence of DVT within either lower extremity. Electronically Signed   By: Simonne Come M.D.   On: 07/18/2020 14:41   US Venous Img Upper Bilat (DVT)  Result Date: 07/18/2020 CLINICAL DATA:  Bilateral upper extremity pain and edema for the past several weeks. Evaluate for DVT. EXAM: BILATERAL UPPER EXTREMITY VENOUS DOPPLER ULTRASOUND TECHNIQUE: Gray-scale sonography with graded compression, as well as color Doppler and duplex ultrasound were performed to evaluate the bilateral upper extremity deep venous systems from the level of the subclavian vein and including the jugular, axillary, basilic, radial, ulnar and upper cephalic vein. Spectral Doppler was utilized to evaluate flow at rest and with distal augmentation maneuvers. COMPARISON:  None. FINDINGS: RIGHT UPPER EXTREMITY Internal Jugular Vein: No evidence of thrombus. Normal compressibility, respiratory phasicity and response to augmentation. Subclavian Vein: No evidence of thrombus. Normal compressibility, respiratory phasicity and response to augmentation. Axillary  Vein: No evidence of thrombus. Normal compressibility, respiratory phasicity and response to augmentation. Cephalic Vein: No evidence of thrombus. Normal compressibility, respiratory phasicity and response to augmentation. Basilic Vein: No evidence of thrombus. Normal compressibility, respiratory phasicity and response to augmentation. Brachial Veins: No evidence of thrombus. Normal compressibility, respiratory phasicity and response to augmentation. Radial Veins: No evidence of thrombus. Normal compressibility, respiratory phasicity and response to augmentation. Ulnar Veins: No evidence of thrombus. Normal compressibility, respiratory phasicity and response to augmentation. Venous Reflux:  None. Other Findings:  None. LEFT UPPER EXTREMITY Internal Jugular Vein: No evidence of thrombus. Normal compressibility, respiratory phasicity and response to augmentation. Subclavian Vein: No evidence of thrombus. Normal compressibility, respiratory phasicity and response to augmentation. Axillary Vein: No evidence of thrombus. Normal compressibility, respiratory phasicity and response to augmentation. Cephalic Vein: While the proximal aspect of the cephalic vein appears widely patent (image 9), there is hypoechoic occlusive thrombus involving the mid aspect of the cephalic vein at the level of the mid humerus (images 10 and 12). Basilic Vein: No evidence of thrombus. Normal compressibility, respiratory phasicity and response to augmentation. Brachial Veins: No evidence of thrombus. Normal compressibility, respiratory phasicity and response to augmentation. Radial Veins: No evidence of thrombus. Normal compressibility, respiratory phasicity and response to augmentation. Ulnar Veins: No evidence of thrombus. Normal compressibility, respiratory phasicity and response to augmentation. Venous Reflux:  None. Other Findings:  None. IMPRESSION: 1. No evidence of DVT within either upper extremity. 2. Examination is positive for  short-segment occlusive superficial thrombophlebitis involving mid humeral aspect of the cephalic vein. Electronically Signed   By: Simonne Come M.D.   On: 07/18/2020 14:40   DG Chest Port 1 View  Result Date: 07/18/2020 CLINICAL DATA:  Acute respiratory failure with hypoxia EXAM: PORTABLE CHEST 1 VIEW COMPARISON:  07/16/2020 FINDINGS: Single frontal view of the chest demonstrates an enlarged cardiac silhouette. Stable diffuse ground-glass airspace disease compatible with edema. No effusion or pneumothorax. No acute bony abnormalities. IMPRESSION: 1. Persistent bilateral ground-glass airspace disease consistent with pulmonary edema or atypical infection. No change since prior exam. Electronically Signed   By: Sharlet Salina M.D.   On: 07/18/2020 01:52   DG Chest Portable 1 View  Result Date: 07/16/2020 CLINICAL DATA:  Shortness of breath EXAM: PORTABLE CHEST 1 VIEW COMPARISON:  None. FINDINGS: Low lung volumes. Mild cardiomegaly. Diffuse interstitial prominence throughout the lungs. No effusions or pneumothorax. No acute bony abnormality. IMPRESSION: Cardiomegaly. Diffuse interstitial prominence could reflect interstitial edema or less likely atypical infection. Electronically  Signed   By: Charlett Nose M.D.   On: 07/16/2020 20:41   ECHOCARDIOGRAM COMPLETE  Result Date: 07/17/2020    ECHOCARDIOGRAM REPORT   Patient Name:   SHARRYN BELDING Date of Exam: 07/17/2020 Medical Rec #:  [id number]       Height:       57.0 in Accession #:    [id number]      Weight:       231.5 lb Date of Birth:  01/26/1966        BSA:          1.914 m Patient Age:    55 years        BP:           106/49 mmHg Patient Gender: F               HR:           71 bpm. Exam Location:  [facility] Procedure: 2D Echo, Color Doppler, Cardiac Doppler and Intracardiac            Opacification Agent Indications:     I63.9 Stroke  History:         Patient has no prior history of Echocardiogram examinations.                  Signs/Symptoms:Shortness of Breath;  Risk Factors:Sleep Apnea                  and Current Smoker.  Sonographer:     Humphrey Rolls RDCS (AE) Referring Phys:  [id number] Cecille Po MELVIN Diagnosing Phys: Lorine Bears MD  Sonographer Comments: Suboptimal apical window. Image acquisition challenging due to patient body habitus and Image acquisition challenging due to uncooperative patient. IMPRESSIONS  1. Left ventricular ejection fraction, by estimation, is 50 to 55%. The left ventricle has low normal function. The left ventricle has no regional wall motion abnormalities. Left ventricular diastolic parameters are indeterminate.  2. Right ventricular systolic function is normal. The right ventricular size is normal. Tricuspid regurgitation signal is inadequate for assessing PA pressure.  3. Left atrial size was mildly dilated.  4. Right atrial size was mildly dilated.  5. The mitral valve is normal in structure. No evidence of mitral valve regurgitation. No evidence of mitral stenosis.  6. The aortic valve is normal in structure. Aortic valve regurgitation is mild. Mild aortic valve stenosis.  7. The inferior vena cava is dilated in size with <50% respiratory variability, suggesting right atrial pressure of 15 mmHg. FINDINGS  Left Ventricle: Left ventricular ejection fraction, by estimation, is 50 to 55%. The left ventricle has low normal function. The left ventricle has no regional wall motion abnormalities. Definity contrast agent was given IV to delineate the left ventricular endocardial borders. The left ventricular internal cavity size was normal in size. There is no left ventricular hypertrophy. Left ventricular diastolic parameters are indeterminate. Right Ventricle: The right ventricular size is normal. No increase in right ventricular wall thickness. Right ventricular systolic function is normal. Tricuspid regurgitation signal is inadequate for assessing PA pressure. Left Atrium: Left atrial size was mildly dilated. Right Atrium: Right atrial size  was mildly dilated. Pericardium: There is no evidence of pericardial effusion. Mitral Valve: The mitral valve is normal in structure. No evidence of mitral valve regurgitation. No evidence of mitral valve stenosis. MV peak gradient, 3.7 mmHg. The mean mitral valve gradient is 2.0 mmHg. Tricuspid Valve: The tricuspid valve is normal in structure. Tricuspid valve regurgitation is mild .  No evidence of tricuspid stenosis. Aortic Valve: The aortic valve is normal in structure. Aortic valve regurgitation is mild. Mild aortic stenosis is present. Aortic valve mean gradient measures 13.0 mmHg. Aortic valve peak gradient measures 20.8 mmHg. Aortic valve area, by VTI measures 1.59 cm. Pulmonic Valve: The pulmonic valve was normal in structure. Pulmonic valve regurgitation is trivial. No evidence of pulmonic stenosis. Aorta: The aortic root is normal in size and structure. Venous: The inferior vena cava is dilated in size with less than 50% respiratory variability, suggesting right atrial pressure of 15 mmHg. IAS/Shunts: No atrial level shunt detected by color flow Doppler.  LEFT VENTRICLE PLAX 2D LVIDd:         4.90 cm  Diastology LVIDs:         3.40 cm  LV e' medial:    7.51 cm/s LV PW:         1.10 cm  LV E/e' medial:  12.0 LV IVS:        0.80 cm  LV e' lateral:   7.72 cm/s LVOT diam:     2.00 cm  LV E/e' lateral: 11.7 LV SV:         65 LV SV Index:   34 LVOT Area:     3.14 cm  RIGHT VENTRICLE RV Basal diam:  3.50 cm LEFT ATRIUM             Index       RIGHT ATRIUM           Index LA diam:        4.50 cm 2.35 cm/m  RA Area:     21.50 cm LA Vol (A2C):   47.4 ml 24.77 ml/m RA Volume:   65.60 ml  34.27 ml/m LA Vol (A4C):   66.3 ml 34.64 ml/m LA Biplane Vol: 58.2 ml 30.41 ml/m  AORTIC VALVE                    PULMONIC VALVE AV Area (Vmax):    1.37 cm     PV Vmax:       1.10 m/s AV Area (Vmean):   1.20 cm     PV Vmean:      67.600 cm/s AV Area (VTI):     1.59 cm     PV VTI:        0.185 m AV Vmax:           228.00  cm/s  PV Peak grad:  4.8 mmHg AV Vmean:          168.000 cm/s PV Mean grad:  2.0 mmHg AV VTI:            0.407 m AV Peak Grad:      20.8 mmHg AV Mean Grad:      13.0 mmHg LVOT Vmax:         99.20 cm/s LVOT Vmean:        64.100 cm/s LVOT VTI:          0.206 m LVOT/AV VTI ratio: 0.51  AORTA Ao Root diam: 3.10 cm MITRAL VALVE MV Area (PHT): 4.89 cm    SHUNTS MV Area VTI:   2.10 cm    Systemic VTI:  0.21 m MV Peak grad:  3.7 mmHg    Systemic Diam: 2.00 cm MV Mean grad:  2.0 mmHg MV Vmax:       0.96 m/s MV Vmean:      64.1 cm/s MV Decel Time: 155  msec MV E velocity: 90.00 cm/s MV A velocity: 87.00 cm/s MV E/A ratio:  1.03 Lorine Bears MD Electronically signed by Lorine Bears MD Signature Date/Time: 07/17/2020/5:00:35 PM    Final    CT CHEST ABDOMEN PELVIS WO CONTRAST  Result Date: 07/16/2020 CLINICAL DATA:  Shortness of breath. EXAM: CT CHEST, ABDOMEN AND PELVIS WITHOUT CONTRAST TECHNIQUE: Multidetector CT imaging of the chest, abdomen and pelvis was performed following the standard protocol without IV contrast. COMPARISON:  None. FINDINGS: CT CHEST FINDINGS Cardiovascular: Heart is mildly enlarged.  Aorta normal caliber. Mediastinum/Nodes: No mediastinal, hilar, or axillary adenopathy. Trachea and esophagus are unremarkable. Thyroid unremarkable. Lungs/Pleura: Ground-glass airspace opacities within the upper lobes bilaterally, left greater than right as well as left lower lobe. No effusions or pneumothorax. Musculoskeletal: Chest wall soft tissues are unremarkable. No acute bony abnormality. CT ABDOMEN PELVIS FINDINGS Hepatobiliary: No focal liver abnormality is seen. Status post cholecystectomy. No biliary dilatation. Pancreas: No focal abnormality or ductal dilatation. Spleen: No focal abnormality.  Normal size. Adrenals/Urinary Tract: Fullness of the left adrenal gland compatible with hyperplasia. Right adrenal gland and kidneys unremarkable. No stones or hydronephrosis. Stomach/Bowel: Stomach, large and small  bowel grossly unremarkable. Vascular/Lymphatic: Aortic atherosclerosis. No evidence of aneurysm or adenopathy. Reproductive: Prior hysterectomy.  No adnexal masses. Other: No free fluid or free air. Musculoskeletal: No acute bony abnormality. IMPRESSION: Cardiomegaly. Mild ground-glass opacities in the lungs, left greater than right. This could reflect asymmetric edema or infection. No acute findings in the abdomen or pelvis. Aortic atherosclerosis. Electronically Signed   By: Charlett Nose M.D.   On: 07/16/2020 22:13    Assessment and plan-   #Acute anemia and thrombocytopenia Check smear, direct bilirubin, reticulocyte panel, immature platelet fraction, DAT, haptoglobin, TSH, -Blood smear showed moderate thrombocytopenia without clumping.  RBC shows anisocytosis and few nucleated RBCs.  No morphologic changes to indicate micro angiopathic hemolysis.  No abnormal or immature leukocytes.  No increase of schistocytes. Increased immature platelet fraction, reticulocytosis, normal reticulocyte hemoglobin, normal TSH, mildly increased LDH, increased of both direct bilirubin and total bilirubin-this is not typical for pure unconjugated hyperbilirubinemia.. Overall picture is consistent with increased marrow RBC production, secondary to possible hemolysis, or ?blood loss. She has decreased iron saturation, there is history reported from family that patient has had dark color diarrhea recently. Less likely MAHA given lack of schistocytes, close monitor. Likely not warm AIHA given the negative DAT pending cold agglutinin, PNH, flow cytometry, uric acid PRBC transfusion to keep hemoglobin above 7- Hb improved appropriately Replete folic acid for folate deficiency, IV folvite x1 yesterday switch to oral folic acid 1 mg daily. IV Venofer 200mg  x 1 yesterday.  Repeat another dose today.  Increased to both direct bilirubin and total bilirubin.  Check ultrasound liver and spleen-unremarkable.  Normal spleen and  liver. Encephalopathy secondary to CVA versus hypoxic injuries.  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  Need to rule out infectious endocarditis and irregular rhythm as etiologies.  ID is on board. 1/2 blood cultures with MSSE.  Most likely a contaminant TTE no significant abnormality.  Defer to ID recommendation for TEE  Acute kidney failure, probably due to poor oral intake and dehydration.  Continue IV hydration. Acute CHF, cardiology is consulted.  Thank you for allowing me to participate in the care of this patient.   , MD, PhD Hematology Oncology [facility] at [facility] Pager- [facility] 07/18/2020

## 2020-07-18 NOTE — Progress Notes (Signed)
Date of Admission:  07/16/2020      Subjective: More alert today conversant Hard of hearing  Medications:  .  stroke: mapping our early stages of recovery book   Does not apply Once  . budesonide (PULMICORT) nebulizer solution  0.5 mg Nebulization BID  . Chlorhexidine Gluconate Cloth  6 each Topical Q0600  . ipratropium-albuterol  3 mL Nebulization Q6H  . sodium chloride flush  3 mL Intravenous Q12H    Objective: Vital signs in last 24 hours: Patient Vitals for the past 24 hrs:  BP Temp Temp src Pulse Resp SpO2 Weight  07/18/20 1000 (!) 173/162 -- -- 85 (!) 23 95 % --  07/18/20 0900 (!) 131/98 -- -- 80 20 97 % --  07/18/20 0800 (!) 105/59 98 F (36.7 C) Axillary 79 (!) 26 (!) 88 % --  07/18/20 0700 101/65 -- -- 81 (!) 21 95 % --  07/18/20 0600 (!) 108/59 -- -- 73 (!) 21 94 % --  07/18/20 0500 (!) 114/50 98.1 F (36.7 C) Oral 79 20 96 % 91.7 kg  07/18/20 0400 115/64 -- -- 74 18 100 % --  07/18/20 0300 (!) 112/56 -- -- 66 19 100 % --  07/18/20 0200 (!) 100/56 -- -- 66 17 100 % --  07/18/20 0127 -- -- -- 100 (!) 22 100 % --  07/18/20 0105 -- -- -- -- -- 97 % --  07/18/20 0100 122/71 -- -- 75 20 95 % --  07/18/20 0032 -- 98.4 F (36.9 C) Axillary -- -- -- --  07/18/20 0030 (!) 131/54 -- -- 78 (!) 23 (!) 89 % --  07/17/20 2345 (!) 100/52 -- -- -- 20 -- --  07/17/20 2330 (!) 111/51 -- -- 73 (!) 23 96 % --  07/17/20 2315 111/82 -- -- 74 19 96 % --  07/17/20 2300 (!) 113/52 -- -- 80 19 95 % --  07/17/20 2245 126/66 -- -- 81 (!) 21 95 % --  07/17/20 2230 126/61 -- -- 82 18 97 % --  07/17/20 2215 118/61 -- -- 72 18 97 % --  07/17/20 2200 114/63 -- -- 78 (!) 22 95 % --  07/17/20 2145 (!) 137/54 -- -- 84 20 97 % --  07/17/20 2130 112/64 -- -- 78 17 97 % --  07/17/20 2115 113/72 -- -- 77 20 95 % --  07/17/20 2100 106/60 -- -- 65 17 97 % --  07/17/20 2045 (!) 100/56 -- -- 65 18 96 % --  07/17/20 2030 (!) 107/55 -- -- 65 16 98 % --  07/17/20 2015 (!) 109/57 -- -- 65 18 95 % --   07/17/20 2000 (!) 103/59 -- -- 64 16 98 % --  07/17/20 1945 (!) 105/58 -- -- 66 17 97 % --  07/17/20 1930 (!) 105/56 -- -- 67 17 97 % --  07/17/20 1915 (!) 101/54 -- -- -- 16 -- --  07/17/20 1900 (!) 100/55 -- -- -- 16 -- --  07/17/20 1845 (!) 103/54 -- -- 66 18 97 % --  07/17/20 1834 -- -- -- 68 18 99 % --  07/17/20 1833 (!) 99/57 -- -- 67 18 96 % --  07/17/20 1830 -- -- -- 66 17 96 % --  07/17/20 1700 114/66 -- -- 76 20 92 % --  07/17/20 1645 99/62 -- -- 70 20 94 % --  07/17/20 1643 (!) 99/56 98.7 F (37.1 C) Axillary 67 16 98 % --  07/17/20  1545 -- -- -- 72 19 94 % --  07/17/20 1400 (!) 94/52 -- -- 73 (!) 21 98 % --  07/17/20 1345 110/64 -- -- 69 19 97 % --  07/17/20 1330 (!) 106/49 -- -- 76 19 97 % --  07/17/20 1327 -- -- -- -- -- 96 % --  07/17/20 1300 (!) 104/52 -- -- 78 (!) 21 (!) 86 % --  07/17/20 1245 (!) 132/57 -- -- 75 (!) 25 100 % --  07/17/20 1237 92/60 98.6 F (37 C) Axillary 77 (!) 26 100 % --  07/17/20 1200 (!) 102/52 -- -- 70 (!) 24 100 % --  07/17/20 1145 (!) 102/53 -- -- 72 20 100 % --  07/17/20 1130 (!) 96/51 -- -- 72 17 91 % --  07/17/20 1115 (!) 101/52 -- -- 78 (!) 24 -- --  07/17/20 1100 100/61 -- -- 77 (!) 26 90 % --   PHYSICAL EXAM:  General: awake, responds to some commands, verbal Lungs: b/l air entry crepts bases Heart: Regular rate and rhythm, no murmur, rub or gallop. Abdomen: Soft, non-tender,not distended. Bowel sounds normal. No masses Extremities: edematous Skin: dry Lymph: Cervical, supraclavicular normal. Neurologic: cannot examine in detail  Lab Results Recent Labs    07/17/20 0534 07/17/20 1227 07/18/20 0115 07/18/20 0555  WBC 6.0   < > 7.6 7.1  HGB 5.7*   < > 8.1* 8.0*  HCT 18.3*   < > 24.9* 25.7*  NA 130*  --  134*  --   K 4.5  --  3.7  --   CL 96*  --  100  --   CO2 21*  --  21*  --   BUN 73*  --  72*  --   CREATININE 2.99*  --  2.41*  --    < > = values in this interval not displayed.   Liver Panel Recent Labs     07/16/20 2016 07/17/20 0534 07/17/20 0901 07/18/20 0115  PROT 7.0 6.7  --   --   ALBUMIN 2.8* 2.7*  --  2.9*  AST 30 25  --   --   ALT 12 11  --   --   ALKPHOS 64 57  --   --   BILITOT 2.6* 2.5*  --   --   BILIDIR  --   --  0.8*  --    Sedimentation Rate No results for input(s): ESRSEDRATE in the last 72 hours. C-Reactive Protein No results for input(s): CRP in the last 72 hours.  Microbiology: Staph epidermidis in 1 set Studies/Results: CT Head Wo Contrast  Result Date: 07/16/2020 CLINICAL DATA:  Dizziness EXAM: CT HEAD WITHOUT CONTRAST TECHNIQUE: Contiguous axial images were obtained from the base of the skull through the vertex without intravenous contrast. COMPARISON:  None. FINDINGS: Brain: Areas of low-density noted in both frontal lobes concerning for acute to subacute infarcts. No hemorrhage or hydrocephalus. Vascular: No hyperdense vessel or unexpected calcification. Skull: No acute calvarial abnormality. Sinuses/Orbits: No acute findings Other: None IMPRESSION: Areas of low-density in both frontal lobes concerning for acute to subacute infarcts. Electronically Signed   By: Charlett Nose M.D.   On: 07/16/2020 22:08   MR ANGIO HEAD WO CONTRAST  Result Date: 07/17/2020 CLINICAL DATA:  Subarachnoid hemorrhage EXAM: MRA HEAD WITHOUT CONTRAST TECHNIQUE: Angiographic images of the Circle of Willis were acquired using MRA technique without intravenous contrast. COMPARISON:  No pertinent prior exam. FINDINGS: POSTERIOR CIRCULATION: --Vertebral arteries: Normal --Inferior cerebellar  arteries: Normal. --Basilar artery: Normal. --Superior cerebellar arteries: Normal. --Posterior cerebral arteries: Normal. ANTERIOR CIRCULATION: --Intracranial internal carotid arteries: Normal. --Anterior cerebral arteries (ACA): Normal. --Middle cerebral arteries (MCA): Normal. ANATOMIC VARIANTS: None IMPRESSION: Normal intracranial MRA. Electronically Signed   By: Deatra Robinson M.D.   On: 07/17/2020 02:39    MR Angiogram Neck W or Wo Contrast  Result Date: 07/17/2020 CLINICAL DATA:  Encephalopathy EXAM: MRA NECK WITHOUT AND WITH CONTRAST TECHNIQUE: Multiplanar and multiecho pulse sequences of the neck were obtained without and with intravenous contrast. Angiographic images of the neck were obtained using MRA technique without and with intravenous contrast. CONTRAST:  11mL GADAVIST GADOBUTROL 1 MMOL/ML IV SOLN COMPARISON:  None. FINDINGS: Images are degraded by motion. Within that limitation, there is no hemodynamically significant stenosis visualized within the carotid systems. The vertebral arteries are left-dominant. Both vertebral arteries are normal. IMPRESSION: Motion degraded MRA of the neck without visible stenosis or acute abnormality of the carotid or vertebral arteries. Electronically Signed   By: Deatra Robinson M.D.   On: 07/17/2020 03:59   MR BRAIN WO CONTRAST  Result Date: 07/17/2020 CLINICAL DATA:  Encephalopathy EXAM: MRI HEAD WITHOUT CONTRAST TECHNIQUE: Multiplanar, multiecho pulse sequences of the brain and surrounding structures were obtained without intravenous contrast. COMPARISON:  None. FINDINGS: Brain: Multifocal bilateral acute ischemia. The largest areas of infarction are in the frontal lobes, but there are areas of ischemia in both parietal lobes, both occipital lobes and the right greater than left cerebellar hemispheres. No acute or chronic hemorrhage. There is multifocal hyperintense T2-weighted signal within the white matter. Parenchymal volume and CSF spaces are normal. The midline structures are normal. Vascular: Major flow voids are preserved. Skull and upper cervical spine: Normal calvarium and skull base. Visualized upper cervical spine and soft tissues are normal. Sinuses/Orbits:Left maxillary sinus mucosal thickening. No mastoid or middle ear effusion. Normal orbits. IMPRESSION: 1. Multifocal bilateral acute ischemia. The largest areas are in the frontal lobes, but there are  lesions in multiple vascular territories bilaterally. This is most consistent with a cardiac or aortic embolic source. 2. No hemorrhage or mass effect. Electronically Signed   By: Deatra Robinson M.D.   On: 07/17/2020 02:33   US Abdomen Complete  Result Date: 07/17/2020 CLINICAL DATA:  Hyperbilirubinemia EXAM: ABDOMEN ULTRASOUND COMPLETE COMPARISON:  07/16/2020, 07/17/2020 FINDINGS: Gallbladder: Surgically absent Common bile duct: Diameter: 2 mm Liver: No focal lesion identified. Within normal limits in parenchymal echogenicity. Portal vein is patent on color Doppler imaging with normal direction of blood flow towards the liver. IVC: No abnormality visualized. Pancreas: Visualized portion unremarkable. Spleen: Size and appearance within normal limits. Right Kidney: Length: 9.3 cm. Echogenicity within normal limits. No mass or hydronephrosis visualized. Left Kidney: Length: 10.4 cm. Echogenicity within normal limits. No mass or hydronephrosis visualized. Abdominal aorta: Proximal aorta is unremarkable measuring up to 2.7 cm. Distal aorta and bifurcation obscured by bowel gas. Other findings: None. IMPRESSION: 1. Grossly unremarkable abdominal ultrasound in this patient status post cholecystectomy. Electronically Signed   By: Sharlet Salina M.D.   On: 07/17/2020 23:30   US RENAL  Result Date: 07/17/2020 CLINICAL DATA:  Acute kidney injury EXAM: RENAL / URINARY TRACT ULTRASOUND COMPLETE COMPARISON:  None. FINDINGS: Right Kidney: Renal measurements: 10.3 x 3.8 x 4.4 cm = volume: 90 mL. Echogenicity within normal limits. No mass or hydronephrosis visualized. Left Kidney: Renal measurements: 9.6 x 4.8 x 4.3 cm = volume: 95 mL. Echogenicity within normal limits. No mass or hydronephrosis visualized. Bladder: Appears  normal for degree of bladder distention. Other: None. IMPRESSION: No evidence of hydronephrosis or other acute renal abnormality. Electronically Signed   By: Malachy Moan M.D.   On: 07/17/2020 15:29    DG Chest Port 1 View  Result Date: 07/18/2020 CLINICAL DATA:  Acute respiratory failure with hypoxia EXAM: PORTABLE CHEST 1 VIEW COMPARISON:  07/16/2020 FINDINGS: Single frontal view of the chest demonstrates an enlarged cardiac silhouette. Stable diffuse ground-glass airspace disease compatible with edema. No effusion or pneumothorax. No acute bony abnormalities. IMPRESSION: 1. Persistent bilateral ground-glass airspace disease consistent with pulmonary edema or atypical infection. No change since prior exam. Electronically Signed   By: Sharlet Salina M.D.   On: 07/18/2020 01:52   DG Chest Portable 1 View  Result Date: 07/16/2020 CLINICAL DATA:  Shortness of breath EXAM: PORTABLE CHEST 1 VIEW COMPARISON:  None. FINDINGS: Low lung volumes. Mild cardiomegaly. Diffuse interstitial prominence throughout the lungs. No effusions or pneumothorax. No acute bony abnormality. IMPRESSION: Cardiomegaly. Diffuse interstitial prominence could reflect interstitial edema or less likely atypical infection. Electronically Signed   By: Charlett Nose M.D.   On: 07/16/2020 20:41   ECHOCARDIOGRAM COMPLETE  Result Date: 07/17/2020    ECHOCARDIOGRAM REPORT   Patient Name:   Brandi Holland Date of Exam: 07/17/2020 Medical Rec #:  [id number]       Height:       57.0 in Accession #:    [id number]      Weight:       231.5 lb Date of Birth:  January 13, 1966        BSA:          1.914 m Patient Age:    55 years        BP:           106/49 mmHg Patient Gender: F               HR:           71 bpm. Exam Location:  [facility] Procedure: 2D Echo, Color Doppler, Cardiac Doppler and Intracardiac            Opacification Agent Indications:     I63.9 Stroke  History:         Patient has no prior history of Echocardiogram examinations.                  Signs/Symptoms:Shortness of Breath; Risk Factors:Sleep Apnea                  and Current Smoker.  Sonographer:     Humphrey Rolls RDCS (AE) Referring Phys:  [id number] Cecille Po MELVIN Diagnosing Phys: Lorine Bears MD  Sonographer Comments: Suboptimal apical window. Image acquisition challenging due to patient body habitus and Image acquisition challenging due to uncooperative patient. IMPRESSIONS  1. Left ventricular ejection fraction, by estimation, is 50 to 55%. The left ventricle has low normal function. The left ventricle has no regional wall motion abnormalities. Left ventricular diastolic parameters are indeterminate.  2. Right ventricular systolic function is normal. The right ventricular size is normal. Tricuspid regurgitation signal is inadequate for assessing PA pressure.  3. Left atrial size was mildly dilated.  4. Right atrial size was mildly dilated.  5. The mitral valve is normal in structure. No evidence of mitral valve regurgitation. No evidence of mitral stenosis.  6. The aortic valve is normal in structure. Aortic valve regurgitation is mild. Mild aortic valve stenosis.  7. The inferior vena cava is dilated  in size with <50% respiratory variability, suggesting right atrial pressure of 15 mmHg. FINDINGS  Left Ventricle: Left ventricular ejection fraction, by estimation, is 50 to 55%. The left ventricle has low normal function. The left ventricle has no regional wall motion abnormalities. Definity contrast agent was given IV to delineate the left ventricular endocardial borders. The left ventricular internal cavity size was normal in size. There is no left ventricular hypertrophy. Left ventricular diastolic parameters are indeterminate. Right Ventricle: The right ventricular size is normal. No increase in right ventricular wall thickness. Right ventricular systolic function is normal. Tricuspid regurgitation signal is inadequate for assessing PA pressure. Left Atrium: Left atrial size was mildly dilated. Right Atrium: Right atrial size was mildly dilated. Pericardium: There is no evidence of pericardial effusion. Mitral Valve: The mitral valve is normal in structure. No evidence of mitral valve  regurgitation. No evidence of mitral valve stenosis. MV peak gradient, 3.7 mmHg. The mean mitral valve gradient is 2.0 mmHg. Tricuspid Valve: The tricuspid valve is normal in structure. Tricuspid valve regurgitation is mild . No evidence of tricuspid stenosis. Aortic Valve: The aortic valve is normal in structure. Aortic valve regurgitation is mild. Mild aortic stenosis is present. Aortic valve mean gradient measures 13.0 mmHg. Aortic valve peak gradient measures 20.8 mmHg. Aortic valve area, by VTI measures 1.59 cm. Pulmonic Valve: The pulmonic valve was normal in structure. Pulmonic valve regurgitation is trivial. No evidence of pulmonic stenosis. Aorta: The aortic root is normal in size and structure. Venous: The inferior vena cava is dilated in size with less than 50% respiratory variability, suggesting right atrial pressure of 15 mmHg. IAS/Shunts: No atrial level shunt detected by color flow Doppler.  LEFT VENTRICLE PLAX 2D LVIDd:         4.90 cm  Diastology LVIDs:         3.40 cm  LV e' medial:    7.51 cm/s LV PW:         1.10 cm  LV E/e' medial:  12.0 LV IVS:        0.80 cm  LV e' lateral:   7.72 cm/s LVOT diam:     2.00 cm  LV E/e' lateral: 11.7 LV SV:         65 LV SV Index:   34 LVOT Area:     3.14 cm  RIGHT VENTRICLE RV Basal diam:  3.50 cm LEFT ATRIUM             Index       RIGHT ATRIUM           Index LA diam:        4.50 cm 2.35 cm/m  RA Area:     21.50 cm LA Vol (A2C):   47.4 ml 24.77 ml/m RA Volume:   65.60 ml  34.27 ml/m LA Vol (A4C):   66.3 ml 34.64 ml/m LA Biplane Vol: 58.2 ml 30.41 ml/m  AORTIC VALVE                    PULMONIC VALVE AV Area (Vmax):    1.37 cm     PV Vmax:       1.10 m/s AV Area (Vmean):   1.20 cm     PV Vmean:      67.600 cm/s AV Area (VTI):     1.59 cm     PV VTI:        0.185 m AV Vmax:  228.00 cm/s  PV Peak grad:  4.8 mmHg AV Vmean:          168.000 cm/s PV Mean grad:  2.0 mmHg AV VTI:            0.407 m AV Peak Grad:      20.8 mmHg AV Mean Grad:       13.0 mmHg LVOT Vmax:         99.20 cm/s LVOT Vmean:        64.100 cm/s LVOT VTI:          0.206 m LVOT/AV VTI ratio: 0.51  AORTA Ao Root diam: 3.10 cm MITRAL VALVE MV Area (PHT): 4.89 cm    SHUNTS MV Area VTI:   2.10 cm    Systemic VTI:  0.21 m MV Peak grad:  3.7 mmHg    Systemic Diam: 2.00 cm MV Mean grad:  2.0 mmHg MV Vmax:       0.96 m/s MV Vmean:      64.1 cm/s MV Decel Time: 155 msec MV E velocity: 90.00 cm/s MV A velocity: 87.00 cm/s MV E/A ratio:  1.03 Lorine Bears MD Electronically signed by Lorine Bears MD Signature Date/Time: 07/17/2020/5:00:35 PM    Final    CT CHEST ABDOMEN PELVIS WO CONTRAST  Result Date: 07/16/2020 CLINICAL DATA:  Shortness of breath. EXAM: CT CHEST, ABDOMEN AND PELVIS WITHOUT CONTRAST TECHNIQUE: Multidetector CT imaging of the chest, abdomen and pelvis was performed following the standard protocol without IV contrast. COMPARISON:  None. FINDINGS: CT CHEST FINDINGS Cardiovascular: Heart is mildly enlarged.  Aorta normal caliber. Mediastinum/Nodes: No mediastinal, hilar, or axillary adenopathy. Trachea and esophagus are unremarkable. Thyroid unremarkable. Lungs/Pleura: Ground-glass airspace opacities within the upper lobes bilaterally, left greater than right as well as left lower lobe. No effusions or pneumothorax. Musculoskeletal: Chest wall soft tissues are unremarkable. No acute bony abnormality. CT ABDOMEN PELVIS FINDINGS Hepatobiliary: No focal liver abnormality is seen. Status post cholecystectomy. No biliary dilatation. Pancreas: No focal abnormality or ductal dilatation. Spleen: No focal abnormality.  Normal size. Adrenals/Urinary Tract: Fullness of the left adrenal gland compatible with hyperplasia. Right adrenal gland and kidneys unremarkable. No stones or hydronephrosis. Stomach/Bowel: Stomach, large and small bowel grossly unremarkable. Vascular/Lymphatic: Aortic atherosclerosis. No evidence of aneurysm or adenopathy. Reproductive: Prior hysterectomy.  No adnexal  masses. Other: No free fluid or free air. Musculoskeletal: No acute bony abnormality. IMPRESSION: Cardiomegaly. Mild ground-glass opacities in the lungs, left greater than right. This could reflect asymmetric edema or infection. No acute findings in the abdomen or pelvis. Aortic atherosclerosis. Electronically Signed   By: Charlett Nose M.D.   On: 07/16/2020 22:13     Assessment/Plan:  Encephalopathy CVA-embolic infacts  CHF Anemia and thrmobocytopenia Possible hemolysis  AKI on CKD Staph epidermidis in 1 set- could be contaminant Because of concern for cardiac source of thrombus/valve vegetation she is on cefazolin. 2 d echo did not show any mural thrombus or valve vegetaion. She needs TEE. For cardiology consult Discussed the management with care team

## 2020-07-18 NOTE — Progress Notes (Signed)
Neurology Progress Note  S:// Overnight mental status somewhat improved but still remains confused.  No family at bedside at the time of this exam.  O:// Current vital signs: BP (!) 173/162   Pulse 85   Temp 98 F (36.7 C) (Axillary)   Resp (!) 23   Ht 4\' 9"  (1.448 m)   Wt 91.7 kg   SpO2 95%   BMI 43.75 kg/m  Vital signs in last 24 hours: Temp:  [98 F (36.7 C)-98.7 F (37.1 C)] 98 F (36.7 C) (06/03 0800) Pulse Rate:  [64-100] 85 (06/03 1000) Resp:  [16-29] 23 (06/03 1000) BP: (92-173)/(49-162) 173/162 (06/03 1000) SpO2:  [86 %-100 %] 95 % (06/03 1000) FiO2 (%):  [40 %-96 %] 40 % (06/03 0700) Weight:  [91.7 kg] 91.7 kg (06/03 0500) General: Awake alert in no distress, on BiPAP HEENT: Normocephalic/atraumatic Lungs: Wheezing scattered all over CVS: Regular rhythm Abdomen nondistended nontender Neurological exam Awake alert oriented to self. Could not tell me her age, where she is at or the current month. Speech is nondysarthric Extremely poor attention concentration No evidence of gross aphasia although limited by poor attention concentration Cranial nerves II to XII intact Motor exam with antigravity in all fours Sensation intact all over No gross dysmetria  Medications  Current Facility-Administered Medications:  .   stroke: mapping our early stages of recovery book, , Does not apply, Once, 03-18-1990, MD .  acetaminophen (TYLENOL) tablet 650 mg, 650 mg, Oral, Q6H PRN **OR** acetaminophen (TYLENOL) suppository 650 mg, 650 mg, Rectal, Q6H PRN, Synetta Fail, MD .  bisacodyl (DULCOLAX) suppository 10 mg, 10 mg, Rectal, Daily PRN, Synetta Fail, MD .  budesonide (PULMICORT) nebulizer solution 0.5 mg, 0.5 mg, Nebulization, BID, Synetta Fail D, NP, 0.5 mg at 07/18/20 0840 .  ceFAZolin (ANCEF) IVPB 1 g/50 mL premix, 1 g, Intravenous, Q12H, 09/17/20, MD, Stopped at 07/18/20 0111 .  Chlorhexidine Gluconate Cloth 2 % PADS 6 each, 6  each, Topical, Q0600, 09/17/20, NP, 6 each at 07/18/20 0500 .  ipratropium-albuterol (DUONEB) 0.5-2.5 (3) MG/3ML nebulizer solution 3 mL, 3 mL, Nebulization, Q2H PRN, 09/17/20, MD, 3 mL at 07/18/20 0622 .  ipratropium-albuterol (DUONEB) 0.5-2.5 (3) MG/3ML nebulizer solution 3 mL, 3 mL, Nebulization, Q6H, 09/17/20 D, NP, 3 mL at 07/18/20 0840 .  LORazepam (ATIVAN) injection 0.5 mg, 0.5 mg, Intravenous, Q6H PRN, 09/17/20, MD, 0.5 mg at 07/17/20 1138 .  sodium chloride flush (NS) 0.9 % injection 3 mL, 3 mL, Intravenous, Q12H, 09/16/20, MD, 3 mL at 07/18/20 0048  Labs CBC    Component Value Date/Time   WBC 7.1 07/18/2020 0555   RBC 2.68 (L) 07/18/2020 0555   HGB 8.0 (L) 07/18/2020 0555   HGB 12.7 12/03/2013 2107   HCT 25.7 (L) 07/18/2020 0555   HCT 39.7 12/03/2013 2107   PLT 77 (L) 07/18/2020 0555   PLT 202 12/03/2013 2107   MCV 95.9 07/18/2020 0555   MCV 95 12/03/2013 2107   MCH 29.9 07/18/2020 0555   MCHC 31.1 07/18/2020 0555   RDW 21.2 (H) 07/18/2020 0555   RDW 16.4 (H) 12/03/2013 2107   LYMPHSABS 1.3 07/18/2020 0115   MONOABS 0.3 07/18/2020 0115   EOSABS 0.0 07/18/2020 0115   BASOSABS 0.0 07/18/2020 0115   CMP     Component Value Date/Time   NA 134 (L) 07/18/2020 0115   NA 139 12/03/2013 2107   K 3.7 07/18/2020 0115  K 4.0 12/03/2013 2107   CL 100 07/18/2020 0115   CL 104 12/03/2013 2107   CO2 21 (L) 07/18/2020 0115   CO2 29 12/03/2013 2107   GLUCOSE 120 (H) 07/18/2020 0115   GLUCOSE 108 (H) 12/03/2013 2107   BUN 72 (H) 07/18/2020 0115   BUN 4 (L) 12/03/2013 2107   CREATININE 2.41 (H) 07/18/2020 0115   CREATININE 0.91 12/03/2013 2107   CALCIUM 8.9 07/18/2020 0115   CALCIUM 8.6 12/03/2013 2107   PROT 6.7 07/17/2020 0534   PROT 7.7 12/03/2013 2107   ALBUMIN 2.9 (L) 07/18/2020 0115   ALBUMIN 3.0 (L) 12/03/2013 2107   AST 25 07/17/2020 0534   AST 15 12/03/2013 2107   ALT 11 07/17/2020 0534   ALT 13 (L) 12/03/2013  2107   ALKPHOS 57 07/17/2020 0534   ALKPHOS 121 (H) 12/03/2013 2107   BILITOT 2.5 (H) 07/17/2020 0534   BILITOT 0.2 12/03/2013 2107   GFRNONAA 23 (L) 07/18/2020 0115   GFRNONAA >60 12/03/2013 2107   GFRNONAA >60 04/23/2013 1756   GFRAA >60 12/03/2013 2107   GFRAA >60 04/23/2013 1756    Lipid Panel     Component Value Date/Time   CHOL 76 07/17/2020 0534   TRIG 69 07/17/2020 0534   HDL 13 (L) 07/17/2020 0534   CHOLHDL 5.8 07/17/2020 0534   VLDL 14 07/17/2020 0534   LDLCALC NOT CALCULATED 07/17/2020 0534   LDLDIRECT 46.4 07/17/2020 1237   BNP in 1800s  A1c pending LDL 46  2D echocardiogram with EF of 50 to 55%, left and right atrial enlargement.  Imaging I have reviewed images in epic and the results pertinent to this consultation are: MRI brain with multifocal acute ischemic infarcts largest areas in bilateral frontal lobes concerning for central embolic source. MRA head and neck-motion degraded with no emergent LVO Chest x-ray with cardiomegaly and diffuse interstitial prominence   Assessment: 55 year old with past medical history that is unremarkable due to noncompliance with medical care, with 2-week history of feeling unwell not being awake for long acting confused, seen at an outside hospital for concern for fluid overload with CHF as differentials, presenting with worsening confusion and MRI showing multiple embolic looking infarctions in bilateral hemispheres concerning for a central embolic source.  2D echocardiogram was not very impressive for heart failure.  She does have left and right atrial enlargement- suspicion should also be for underlying paroxysmal atrial fibrillation. Given the distribution, infective endocarditis should also be in the differentials. Other than this, she does have severe anemia and thrombocytopenia for which she is being worked up by hematology. Strokes do not appear watershed, but that could be a possibility too. Blood cultures are 1 bottle  with possible staph epidermidis growth. Systemic processes such as autoimmune processes like lupus should also be in the differential diagnosis.  Recommendations: Keep holding aspirin for now-has anemia, thrombocytopenia as well as concern for endocarditis-less likely per ID. I still think she would benefit from TEE-not just for endocarditis but to look for a PFO Lower extremity Dopplers. Frequent neurochecks Check SLE panel LDL at goal-no need for statin Management of anemia and thrombocytopenia per primary team, hematology as you are. Appreciate ID input D/W Dr Belia Heman in the unit  -- Milon Dikes, MD Neurologist [facility] Pager: [phone]

## 2020-07-18 NOTE — Progress Notes (Signed)
Chaplain Maggie made introductory visitation at bedside to meet patient who was alert. Her husband was present during the visit. Chaplain created hospitable space by gathering chairs from another room and getting water and ice. Room was made for storytelling and empathetic listening. Patient's husband shared pictures of his woodworking hobby and of his concern for caring for his wife while maintaining a work schedule and keeping active. He noted feeling relieved that his wife is alert today and that she is thirsty again and may be hungry. He said she loves to drink sweet tea and had stopping eating and drinking before she came to the hospital. He told of the challenge it was for the family to get the patient to agree to come to the hospital. A long time friend of hers was helpful in convincing her to go to the hospital. Chaplain led the couple in prayer and informed the family of how to reach a chaplain for continued support. Chaplain Seward Grater will intentionally follow up.

## 2020-07-19 DIAGNOSIS — I6389 Other cerebral infarction: Secondary | ICD-10-CM | POA: Diagnosis not present

## 2020-07-19 DIAGNOSIS — D500 Iron deficiency anemia secondary to blood loss (chronic): Secondary | ICD-10-CM | POA: Diagnosis not present

## 2020-07-19 DIAGNOSIS — G9340 Encephalopathy, unspecified: Secondary | ICD-10-CM | POA: Diagnosis not present

## 2020-07-19 LAB — EXTRACTABLE NUCLEAR ANTIGEN ANTIBODY
ENA SM Ab Ser-aCnc: <0.2 AI (ref 0.0–0.9)
Ribonucleic Protein: <0.2 AI (ref 0.0–0.9)
SSA (Ro) (ENA) Antibody, IgG: 0.2 AI (ref 0.0–0.9)
SSB (La) (ENA) Antibody, IgG: 0.2 AI (ref 0.0–0.9)
Scleroderma (Scl-70) (ENA) Antibody, IgG: <0.2 AI (ref 0.0–0.9)
ds DNA Ab: <1 IU/mL (ref 0–9)

## 2020-07-19 LAB — CBC WITH DIFFERENTIAL/PLATELET
Abs Immature Granulocytes: 0.09 10*3/uL — ABNORMAL HIGH (ref 0.00–0.07)
Basophils Absolute: 0 10*3/uL (ref 0.0–0.1)
Basophils Relative: 0 %
Eosinophils Absolute: 0.1 10*3/uL (ref 0.0–0.5)
Eosinophils Relative: 1 %
HCT: 25 % — ABNORMAL LOW (ref 36.0–46.0)
Hemoglobin: 8 g/dL — ABNORMAL LOW (ref 12.0–15.0)
Immature Granulocytes: 2 %
Lymphocytes Relative: 25 %
Lymphs Abs: 1.4 10*3/uL (ref 0.7–4.0)
MCH: 30.4 pg (ref 26.0–34.0)
MCHC: 32 g/dL (ref 30.0–36.0)
MCV: 95.1 fL (ref 80.0–100.0)
Monocytes Absolute: 0.2 10*3/uL (ref 0.1–1.0)
Monocytes Relative: 4 %
Neutro Abs: 3.6 10*3/uL (ref 1.7–7.7)
Neutrophils Relative %: 68 %
Platelets: 61 10*3/uL — ABNORMAL LOW (ref 150–400)
RBC: 2.63 MIL/uL — ABNORMAL LOW (ref 3.87–5.11)
RDW: 21.7 % — ABNORMAL HIGH (ref 11.5–15.5)
Smear Review: NORMAL
WBC: 5.4 10*3/uL (ref 4.0–10.5)
nRBC: 1.7 % — ABNORMAL HIGH (ref 0.0–0.2)

## 2020-07-19 LAB — MAGNESIUM: Magnesium: 2.2 mg/dL (ref 1.7–2.4)

## 2020-07-19 LAB — HAPTOGLOBIN: Haptoglobin: 206 mg/dL (ref 33–346)

## 2020-07-19 LAB — URINALYSIS, COMPLETE (UACMP) WITH MICROSCOPIC
Bilirubin Urine: NEGATIVE
Glucose, UA: NEGATIVE mg/dL
Ketones, ur: NEGATIVE mg/dL
Nitrite: NEGATIVE
Protein, ur: 30 mg/dL — AB
Specific Gravity, Urine: 1.024 (ref 1.005–1.030)
pH: 5 (ref 5.0–8.0)

## 2020-07-19 LAB — RENAL FUNCTION PANEL
Albumin: 2.3 g/dL — ABNORMAL LOW (ref 3.5–5.0)
Anion gap: 8 (ref 5–15)
BUN: 44 mg/dL — ABNORMAL HIGH (ref 6–20)
CO2: 24 mmol/L (ref 22–32)
Calcium: 8.3 mg/dL — ABNORMAL LOW (ref 8.9–10.3)
Chloride: 103 mmol/L (ref 98–111)
Creatinine, Ser: 1.38 mg/dL — ABNORMAL HIGH (ref 0.44–1.00)
GFR, Estimated: 45 mL/min — ABNORMAL LOW (ref >60)
Glucose, Bld: 100 mg/dL — ABNORMAL HIGH (ref 70–99)
Phosphorus: 2.5 mg/dL (ref 2.5–4.6)
Potassium: 3 mmol/L — ABNORMAL LOW (ref 3.5–5.1)
Sodium: 135 mmol/L (ref 135–145)

## 2020-07-19 LAB — STREP PNEUMONIAE URINARY ANTIGEN: Strep Pneumo Urinary Antigen: NEGATIVE

## 2020-07-19 LAB — CULTURE, BLOOD (SINGLE)

## 2020-07-19 LAB — ANTI-JO 1 ANTIBODY, IGG: Anti JO-1: <0.2 AI (ref 0.0–0.9)

## 2020-07-19 MED ORDER — POTASSIUM CHLORIDE CRYS ER 20 MEQ PO TBCR
30.0000 meq | EXTENDED_RELEASE_TABLET | Freq: Two times a day (BID) | ORAL | Status: AC
  Administered 2020-07-19 (×2): 30 meq via ORAL
  Filled 2020-07-19 (×2): qty 2

## 2020-07-19 MED ORDER — ALBUMIN HUMAN 25 % IV SOLN
25.0000 g | Freq: Every day | INTRAVENOUS | Status: DC
  Administered 2020-07-19 – 2020-07-22 (×4): 25 g via INTRAVENOUS
  Filled 2020-07-19 (×3): qty 100

## 2020-07-19 MED ORDER — CEFAZOLIN SODIUM-DEXTROSE 2-4 GM/100ML-% IV SOLN
2.0000 g | Freq: Three times a day (TID) | INTRAVENOUS | Status: DC
  Administered 2020-07-19 – 2020-07-21 (×5): 2 g via INTRAVENOUS
  Filled 2020-07-19 (×8): qty 100

## 2020-07-19 MED ORDER — ORAL CARE MOUTH RINSE
15.0000 mL | Freq: Two times a day (BID) | OROMUCOSAL | Status: DC
  Administered 2020-07-19 – 2020-08-01 (×23): 15 mL via OROMUCOSAL

## 2020-07-19 MED ORDER — ENSURE ENLIVE PO LIQD
237.0000 mL | Freq: Two times a day (BID) | ORAL | Status: DC
  Administered 2020-07-19 – 2020-08-01 (×18): 237 mL via ORAL

## 2020-07-19 MED ORDER — FUROSEMIDE 10 MG/ML IJ SOLN
40.0000 mg | Freq: Once | INTRAMUSCULAR | Status: AC
  Administered 2020-07-19: 40 mg via INTRAVENOUS
  Filled 2020-07-19: qty 4

## 2020-07-19 NOTE — Progress Notes (Addendum)
NAME:  Brandi Holland, MRN:  [id number], DOB:  05/16/1965, LOS: 2 ADMISSION DATE:  07/16/2020, CONSULTATION DATE:  07/17/20 REFERRING MD:  Dr. Nelson Chimes, CHIEF COMPLAINT:  Altered Mental Status, SOB and Hypoxia  Brief Pt Description / Synopsis:  55 y.o. Female admitted with Acute CVA and Acute Metabolic Encephalopathy possibly due septic emboli from suspected Endocarditis, MSSE Bacteremia, Acute Hypoxic Respiratory Failure in the setting of Pulmonary Edema +/- AECOPD, AKI on CKD 3, severe anemia and thrombocytopenia.  High risk for deterioration and need for intubation.  History of Present Illness:  Brandi Holland is a 55 year old female with a past medical history significant for asthma, OSA, and fibromyalgia who presented to [facility] ED on 07/16/2020 due to complaints of shortness of breath and altered mental status.  Upon presentation she was noted to be hypoxic with O2 saturations in the 60s.  Patient is currently with altered mental status, therefore history is obtained from ED and nursing notes, along with reports per patient's husband.  Per the patient's husband she has not been feeling well for the past 2 weeks, unable to get out of bed, with very poor p.o. intake.  He reports she did have diarrhea, black in color, with some blood noted in the stool last week (approximately 2 days in duration).  She refused to see a doctor, and she continued to deteriorate.  Patient's husband asked her son who lives in [place] to come in, and upon his arrival he called EMS.  Husband denies any knowledge of of recent upper respiratory illnesses, fever, chills, abdominal pain, chest pain, dysuria.  Of note she was recently admitted at [facility] from 06/18/2020 to 06/20/2020 due to lower extremity edema.  She was diuresed, however she left before full work-up could be performed.  No echocardiogram was performed.  CTA chest was negative for PE at that time, and no significant abnormality on labs except for sodium of 122.  Work-up: Patient  remained very altered with intermittent desaturation, ABG with some respiratory alkalosis, no hypoxia.1/2 blood culture bottles with MSSE, MRI brain with multiple bilateral infarcts, worsening renal function so unable to obtain CTA, VQ scan ordered but got canceled due to recurrent desaturation.  D-dimer at 2.6, hemoglobin of 5.7, platelet counts of 97 this morning and there were 255 during recent hospitalization at [facility], BUN of 73 and creatinine of 2.99, sodium of 130, chloride of 96 and bicarb of 21.  Elevated T bili at 2.5 and INR of 1.5, lactic acidosis 3.2>>2.5>>1.9, elevated troponin 708>>904, BNP at 1893, CT chest and abdomen with bilateral groundglass opacities which can be due to pulmonary edema versus atypical pneumonia and no significant abnormality on CT abdomen.  Echocardiogram ordered-pending. Received 1 unit of PRBC with improvement of hemoglobin to 6.6-2 more units ordered.  Patient is being admitted to stepdown unit by the hospitalist for further work-up and treatment of Acute CVA and Acute Metabolic Encephalopathy possibly due septic emboli from suspected Endocarditis, MSSE Bacteremia, Acute Hypoxic Respiratory Failure in the setting of Pulmonary Edema +/- AECOPD, AKI on CKD 3, severe anemia and thrombocytopenia.  High risk for deterioration and need for intubation.  Neurology, cardiology, infectious disease, and PCCM have been consulted.   Pertinent  Medical History  Asthma Obstructive sleep apnea Fibromyalgia  Micro Data:  07/16/2020: SARS-CoV-2 PCR and influenza PCR>> negative 07/16/2020: Blood culture w/ 1 of 2 >> MSSE 07/17/2020: HIV screen>> nonreactive 07/17/2020: Strep pneumo urinary antigen>> 07/17/2020: Legionella urinary antigen>>  Antimicrobials:  Cefepime 6/1>>6/2 Vancomycin 6/1>> 6/2 Cefazolin  6/2>>  Significant Hospital Events: Including procedures, antibiotic start and stop dates in addition to other pertinent events   . 07/16/2020: Presented to ED with altered mental  status and shortness of breath, MRI with CVA, Neurology consulted . 07/17/2020: 1/2 Blood cultures + for MSSE, concern for infective endocarditis with septic emboli, Cardiology, ID, PCCM consulted. High risk for deterioration. ID changed ABX to Cefazolin  . 07/18/2020: More alert today (remains confused), no focal deficits, tolerating ice chips, speech evaluation obtained. [place] of bilateral LE &UE negative for DVT. Transthoracic Echo w/o evidence of vegetations . 07/19/2020:  Interim History / Subjective:  -No acute events noted overnight -Afebrile, hemodynamically stable, remains on HHFNC and weaning down FiO2  -Pt is more alert today. Able to follow commands, no focal deficits, tolerating ice chips, Speech therapy evaluation  Objective   Blood pressure (!) 97/48, pulse 86, temperature 98 F (36.7 C), temperature source Axillary, resp. rate 15, height  (1.448 m), weight 90.3 kg, SpO2 100 %.    FiO2 (%):  [40 %] 40 %   Intake/Output Summary (Last 24 hours) at 07/19/2020 0924 Last data filed at 07/19/2020 0700 Gross per 24 hour  Intake 50 ml  Output 1050 ml  Net -1000 ml   Filed Weights   07/16/20 2011 07/18/20 0500 07/19/20 0500  Weight: 105 kg 91.7 kg 90.3 kg    GENERAL: 55 -year-old  Critically ill patient lying in the bed with no acute distress.  EYES: Pupils equal, round, reactive to light and accommodation. No scleral icterus. Extraocular muscles intact.  HEENT: Head atraumatic, normocephalic. Oropharynx and nasopharynx clear.  NECK:  Supple, no jugular venous distention. No thyroid enlargement, no tenderness.  LUNGS: Normal breath sounds bilaterally, no wheezing, rales,rhonchi or crepitation. No use of accessory muscles of respiration.  CARDIOVASCULAR: S1, S2 normal. No murmurs, rubs, or gallops.  ABDOMEN: Soft, nontender, nondistended. Bowel sounds present. No organomegaly or mass.  EXTREMITIES: No pedal edema, cyanosis, or clubbing.  NEUROLOGIC: Cranial nerves II through XII are  intact. Except hard of hearing.  Muscle strength 5/5 in all extremities. Sensation intact. Gait not checked.  PSYCHIATRIC: The patient is alert and oriented x 3.  SKIN: No obvious rash, lesion, or ulcer.    Labs/imaging that I havepersonally reviewed  (right click and "Reselect all SmartList Selections" daily)  Labs 6//2022: Sodium 134, bicarb 21, glucose 120, BUN 72, creatinine 2.41, LDH 317, procalcitonin 0.27, WBC 7.1, hemoglobin 8.0, hematocrit 25.7, platelets 77, ammonia 17  Chest x-ray 07/18/2020:1. Persistent bilateral ground-glass airspace disease consistent with pulmonary edema or atypical infection. No change since prior exam. CT head without contrast 07/16/2020:Areas of low-density in both frontal lobes concerning for acute to subacute infarcts. MRI brain 07/17/2020:1. Multifocal bilateral acute ischemia. The largest areas are in the frontal lobes, but there are lesions in multiple vascular territories bilaterally. This is most consistent with a cardiac or aortic embolic source. 2. No hemorrhage or mass effect. MRA head  07/17/2020: Normal intracranial MRA. MRA neck 07/17/2020:Motion degraded MRA of the neck without visible stenosis or acute abnormality of the carotid or vertebral arteries. Renal ultrasound 07/17/2020>> No evidence of hydronephrosis or other acute renal abnormality Venous US BLE 07/18/20>>negative for DVT Venous US BUE 07/18/20>>1. No evidence of DVT within either upper extremity. 2. Examination is positive for short-segment occlusive superficial thrombophlebitis involving mid humeral aspect of the cephalic vein. Echocardiogram 07/17/20:1. Left ventricular ejection fraction, by estimation, is 50 to 55%. The  left ventricle has low normal function. The  left ventricle has no regional  wall motion abnormalities. Left ventricular diastolic parameters are  indeterminate.  2. Right ventricular systolic function is normal. The right ventricular  size is normal. Tricuspid  regurgitation signal is inadequate for assessing  PA pressure.  3. Left atrial size was mildly dilated.  4. Right atrial size was mildly dilated.  5. The mitral valve is normal in structure. No evidence of mitral valve  regurgitation. No evidence of mitral stenosis.  6. The aortic valve is normal in structure. Aortic valve regurgitation is  mild. Mild aortic valve stenosis.  7. The inferior vena cava is dilated in size with <50% respiratory  variability, suggesting right atrial pressure of 15 mmHg.   Labs   CBC: Recent Labs  Lab 07/17/20 1227 07/18/20 0115 07/18/20 0555 07/18/20 1216 07/19/20 0345  WBC 6.3 7.6 7.1 6.7 5.4  NEUTROABS 4.5 5.7  --   --  3.6  HGB 6.5*  6.6* 8.1* 8.0* 8.3* 8.0*  HCT 20.6*  20.9* 24.9* 25.7* 25.9* 25.0*  MCV 96.7 94.7 95.9 94.9 95.1  PLT 80* 79* 77* 73* 61*    Basic Metabolic Panel: Recent Labs  Lab 07/16/20 2016 07/17/20 0534 07/18/20 0115 07/19/20 0345  NA 130* 130* 134* 135  K 4.3 4.5 3.7 3.0*  CL 93* 96* 100 103  CO2 22 21* 21* 24  GLUCOSE 123* 148* 120* 100*  BUN 70* 73* 72* 44*  CREATININE 3.15* 2.99* 2.41* 1.38*  CALCIUM 8.8* 8.6* 8.9 8.3*  MG  --   --   --  2.2  PHOS  --   --  5.1* 2.5   GFR: Estimated Creatinine Clearance: 42.6 mL/min (A) (by C-G formula based on SCr of 1.38 mg/dL (H)). Recent Labs  Lab 07/16/20 2016 07/17/20 0025 07/17/20 0534 07/17/20 0901 07/17/20 1227 07/18/20 0115 07/18/20 0555 07/18/20 1216 07/19/20 0345  PROCALCITON 0.35  --  0.34  --   --  0.27  --   --   --   WBC  --    < > 6.0  --  6.3 7.6 7.1 6.7 5.4  LATICACIDVEN 3.2*  --   --  2.5* 1.9  --   --   --   --    < > = values in this interval not displayed.    Liver Function Tests: Recent Labs  Lab 07/16/20 2016 07/17/20 0534 07/18/20 0115 07/18/20 1216 07/19/20 0345  AST 30 25  --   --   --   ALT 12 11  --   --   --   ALKPHOS 64 57  --   --   --   BILITOT 2.6* 2.5*  --  2.6*  --   PROT 7.0 6.7  --   --   --   ALBUMIN 2.8*  2.7* 2.9*  --  2.3*   Recent Labs  Lab 07/16/20 2016  LIPASE 32   Recent Labs  Lab 07/18/20 0837  AMMONIA 17    ABG    Component Value Date/Time   PHART 7.46 (H) 07/17/2020 1211   PCO2ART 29 (L) 07/17/2020 1211   PO2ART 204 (H) 07/17/2020 1211   HCO3 20.6 07/17/2020 1211   ACIDBASEDEF 2.8 (H) 07/17/2020 1211   O2SAT 99.8 07/17/2020 1211     Coagulation Profile: Recent Labs  Lab 07/17/20 0018 07/17/20 0534  INR 1.5* 1.5*    Cardiac Enzymes: Recent Labs  Lab 07/16/20 2016  CKTOTAL 266*    HbA1C: Hgb A1c MFr Bld  Date/Time Value Ref Range Status  07/17/2020 09:01 AM 5.0 4.8 - 5.6 % Final    Comment:    (NOTE)         Prediabetes: 5.7 - 6.4         Diabetes: >6.4         Glycemic control for adults with diabetes: <7.0     CBG: Recent Labs  Lab 07/18/20 0022  GLUCAP 112*    Review of Systems:   Unable to assess due to AMS  Past Medical History:  She,  has a past medical history of Arthritis, Asthma, Fibromyalgia, OSA (obstructive sleep apnea), and Peripheral neuropathy.   Surgical History:  No surgical history on file   Social History:      Family History:  Her family history includes Cancer in her father and sister; Chorea in her mother and sister; Dementia in her father; Emphysema in her mother; Heart disease in her sister; Hypertension in her mother; Osteoporosis in her sister; Thyroid disease in her mother; Ulcers in her mother.   Allergies Allergies  Allergen Reactions  . Clarithromycin Swelling  . Sulfa Antibiotics Swelling     Home Medications  Prior to Admission medications   Medication Sig Start Date End Date Taking? Authorizing Provider  clotrimazole (LOTRIMIN) 1 % cream Apply 1 application topically 2 (two) times daily. 06/22/20  Yes [provider]  PROAIR HFA 108 (90 Base) MCG/ACT inhaler Inhale 2 puffs into the lungs every 4 (four) hours as needed. 06/22/20  Yes [provider]  furosemide (LASIX) 20 MG tablet  Take 20 mg by mouth daily. 06/20/20 07/04/20  [provider]     Assessment & Plan:   Acute Hypoxic Respiratory Failure in the setting of Pulmonary Edema +/- AECOPD PMHx of Asthma, OSA, suspected OHS -Supplemental O2 as needed to maintain O2 saturations greater than 92% -Currently tolerating HHFNC and protecting airway -High risk for intubation  -Follow intermittent chest x-ray and ABG as needed -Diuresis as BP and renal function permits ~ will defer to Cardiology -Bronchodilators & Budesonide nebs -Elevated D-dimer, but unable to perform CTA Chest due to AKI -Unable to complete Lung V/Q scan due to episodes of desaturation during scan  -Ultrasound of bilateral upper & Lower extremities ~ all negative for DVT's  Acute CVA & Acute Metabolic Encephalopathy, possibly due Cardiac embolic source in setting of ? Endocarditis vs thrombus 2/2 Heart Failure -MRI head 07/17/2020 with multifocal bilateral acute ischemia (largest area in the frontal lobes, but lesions in multiple vascular territories bilaterally) most consistent with cardiac/aortic embolic source; No hemorrhagic or mass-effect noted. -MRA head normal, MRA neck without visible stenosis or acute abnormality of the carotid or vertebral arteries -Frequent neuro checks -Neurology following, appreciate input -Speech therapy, PT/OT -No antiplatelets at this time due to severe anemia and thrombocytopenia -UDS negative  MSSE Bacteremia (1/2 blood cultures +, ? Contaminant) ? Endocarditis Unable to rule out Multifocal Pneumonia Elevated Troponin in setting of Demand Ischemia vs. NSTEMI -Monitor fever curve -Trend WBC's & Procalcitonin -Follow cultures as above -Check strep pneumo and Legionella urinary antigens -ID consulted, appreciate input  -Continue Cefazolin as per ID recommendations -No vegetations noted on transthoracic Echo -Cardiology consulted for possible TEE  Elevated Troponin in setting of Demand Ischemia vs.  NSTEMI Acute on Chronic CHF (suspect diastolic) -Continuous cardiac monitoring -Maintain MAP greater than 65 -Vasopressors if needed to maintain MAP goal -Trend lactic acid ~ lactic acidosis resolved -Trend at HS troponin until peaked -Cardiology following, appreciate  input -Diuresis as BP and renal function permits ~ will defer to Cardiology -Echocardiogram with LVEF 50-55%, indeterminate diastolic parameters, RV systolic function normal, no vegetations noted -Consider TEE when medically stable  Anemia without overt s/sx of bleeding (pt's husband mentioned dark colored stools for 2 days last week, no recurrence at this time) Thrombocytopenia Hemolysis vs Acute blood loss? -Monitor for S/Sx of bleeding -Trend CBC -Transfuse for Hgb <7 -Transfuse platelets for platelet count less than 50 K and active bleeding -Replete folic acidfor folate deficiency, IV folvite x1 switch to oral folic acid 1 mg daily. -IV Venofer 200mg  x 2 -Hematology following, appreciate input  AKI on CKD Stage III (unkown baseline, Cr. 1.[facility]) Hyponatremia Cr improving from 2.41 to 1.38 today -Monitor I&O's / urinary output -Follow BMP -Ensure adequate renal perfusion -Avoid nephrotoxic agents as able -Replace electrolytes as indicated -Renal US is pending ~ negative -Consider Nephrology consult   Best practice (right click and "Reselect all SmartList Selections" daily)  Diet:  NPO Pain/Anxiety/Delirium protocol (if indicated): No VAP protocol (if indicated): Not indicated DVT prophylaxis: Contraindicated GI prophylaxis: PPI Glucose control:  SSI No Central venous access:  N/A Arterial line:  N/A Foley:  N/A Mobility:  bed rest  PT consulted: N/A Last date of multidisciplinary goals of care discussion [07/18/20] Code Status:  full code Disposition: Stepdown   Critical care time: 35 minutes     Webb SilversmithElizabeth Tamas Suen, DNP, CCRN, FNP-C, AGACNP-BC Acute Care Nurse  Practitioner  [facility] Pager: [phone] [facility]

## 2020-07-19 NOTE — Progress Notes (Addendum)
Progress Note  Patient Name: Brandi Holland Date of Encounter: 07/19/2020  Primary Cardiologist: New to [facility] - consult by Kirke Corin  Subjective   More alert today. Remains on HFNC. No chest pain. Has tremor. Renal function continues to improve. HGB stable.   Inpatient Medications    Scheduled Meds: .  stroke: mapping our early stages of recovery book   Does not apply Once  . budesonide (PULMICORT) nebulizer solution  0.5 mg Nebulization BID  . Chlorhexidine Gluconate Cloth  6 each Topical Q0600  . folic acid  1 mg Oral Daily  . ipratropium-albuterol  3 mL Nebulization Q6H  . nicotine  21 mg Transdermal Daily  . potassium chloride  30 mEq Oral BID  . sodium chloride flush  3 mL Intravenous Q12H   Continuous Infusions: .  ceFAZolin (ANCEF) IV 2 g (07/19/20 0202)   PRN Meds: acetaminophen **OR** acetaminophen, bisacodyl, ipratropium-albuterol, LORazepam   Vital Signs    Vitals:   07/19/20 0300 07/19/20 0400 07/19/20 0500 07/19/20 0600  BP: (!) 105/54 (!) 96/48 (!) 96/47 (!) 92/53  Pulse: 80 73 71 69  Resp: (!) 23 17 15 15   Temp:      TempSrc:      SpO2: 98% 100% 99% 100%  Weight:   90.3 kg   Height:        Intake/Output Summary (Last 24 hours) at 07/19/2020 0855 Last data filed at 07/19/2020 0700 Gross per 24 hour  Intake 50 ml  Output 1050 ml  Net -1000 ml   Filed Weights   07/16/20 2011 07/18/20 0500 07/19/20 0500  Weight: 105 kg 91.7 kg 90.3 kg    Telemetry    SR with rare PVCs - Personally Reviewed  ECG    No new tracings - Personally Reviewed  Physical Exam   GEN: No acute distress. Tremor noted. HOH. Neck: No JVD. Cardiac: RRR, no murmurs, rubs, or gallops.  Respiratory: Diminished breath sounds bilaterally.  GI: Soft, nontender, non-distended.   MS: Trace bilateral pretibial edema; No deformity. Neuro:  Alert; Nonfocal.  Psych: Normal affect.  Labs    Chemistry Recent Labs  Lab 07/16/20 2016 07/17/20 0534 07/18/20 0115 07/18/20 1216  07/19/20 0345  NA 130* 130* 134*  --  135  K 4.3 4.5 3.7  --  3.0*  CL 93* 96* 100  --  103  CO2 22 21* 21*  --  24  GLUCOSE 123* 148* 120*  --  100*  BUN 70* 73* 72*  --  44*  CREATININE 3.15* 2.99* 2.41*  --  1.38*  CALCIUM 8.8* 8.6* 8.9  --  8.3*  PROT 7.0 6.7  --   --   --   ALBUMIN 2.8* 2.7* 2.9*  --  2.3*  AST 30 25  --   --   --   ALT 12 11  --   --   --   ALKPHOS 64 57  --   --   --   BILITOT 2.6* 2.5*  --  2.6*  --   GFRNONAA 17* 18* 23*  --  45*  ANIONGAP 15 13 13   --  8     Hematology Recent Labs  Lab 07/18/20 0555 07/18/20 1216 07/19/20 0345  WBC 7.1 6.7 5.4  RBC 2.68* 2.73* 2.63*  HGB 8.0* 8.3* 8.0*  HCT 25.7* 25.9* 25.0*  MCV 95.9 94.9 95.1  MCH 29.9 30.4 30.4  MCHC 31.1 32.0 32.0  RDW 21.2* 21.8* 21.7*  PLT 77*  73* 61*    Cardiac EnzymesNo results for input(s): TROPONINI in the last 168 hours. No results for input(s): TROPIPOC in the last 168 hours.   BNP Recent Labs  Lab 07/17/20 0025 07/17/20 0534  BNP 1,768.8* 1,893.4*     DDimer  Recent Labs  Lab 07/16/20 2016  DDIMER 2.60*     Radiology    US Abdomen Complete  Result Date: 07/17/2020 IMPRESSION: 1. Grossly unremarkable abdominal ultrasound in this patient status post cholecystectomy. Electronically Signed   By: Sharlet Salina M.D.   On: 07/17/2020 23:30   US RENAL  Result Date: 07/17/2020 IMPRESSION: No evidence of hydronephrosis or other acute renal abnormality. Electronically Signed   By: Malachy Moan M.D.   On: 07/17/2020 15:29   US Venous Img Lower Bilateral (DVT)  Result Date: 07/18/2020 IMPRESSION: No evidence of DVT within either lower extremity. Electronically Signed   By: Simonne Come M.D.   On: 07/18/2020 14:41   US Venous Img Upper Bilat (DVT)  Result Date: 07/18/2020 IMPRESSION: 1. No evidence of DVT within either upper extremity. 2. Examination is positive for short-segment occlusive superficial thrombophlebitis involving mid humeral aspect of the cephalic vein.  Electronically Signed   By: Simonne Come M.D.   On: 07/18/2020 14:40   DG Chest Port 1 View  Result Date: 07/18/2020 IMPRESSION: 1. Persistent bilateral ground-glass airspace disease consistent with pulmonary edema or atypical infection. No change since prior exam. Electronically Signed   By: Sharlet Salina M.D.   On: 07/18/2020 01:52    Cardiac Studies   2D echo 07/17/2020: 1. Left ventricular ejection fraction, by estimation, is 50 to 55%. The  left ventricle has low normal function. The left ventricle has no regional  wall motion abnormalities. Left ventricular diastolic parameters are  indeterminate.  2. Right ventricular systolic function is normal. The right ventricular  size is normal. Tricuspid regurgitation signal is inadequate for assessing  PA pressure.  3. Left atrial size was mildly dilated.  4. Right atrial size was mildly dilated.  5. The mitral valve is normal in structure. No evidence of mitral valve  regurgitation. No evidence of mitral stenosis.  6. The aortic valve is normal in structure. Aortic valve regurgitation is  mild. Mild aortic valve stenosis.  7. The inferior vena cava is dilated in size with <50% respiratory  variability, suggesting right atrial pressure of 15 mmHg.  Patient Profile     55 y.o. female with history of fibromyalgia, peripheral neuropathy, asthma, arthritis, ongoing tobacco use, and OSA who was admitted on 6/2 with acute respiratory failure secondary to AECOPD and acute on chronic HFpEF complicated by frontal lobe CVA, ARF, and severe symptomatic anemia who is being seen today for the evaluation of elevated troponin, elevated BNP and acute CVA at the request of Dr. Nelson Chimes.  Assessment & Plan    1. Acute frontal lobe CVA: -Multiple vascular territories involved concerning for cardiac or aortic embolic etiology -No evidence of A. fib thus far on telemetry, continue to monitor  -Will need outpatient cardiac monitoring  -2D surface echo as  above -Plan for TEE when medically stable, possibly next week -1 of 2 blood cultures growing staph epidermidis is likely a contaminant with repeat blood cultures pending at this time -Neurology following  -No antiplatelets at this time with anemia and thrombocytopenia   2. Elevated troponin: -Possibly in the setting of acute CVA, versus demand ischemia in the context of acute hypoxic respiratory failure secondary to likely COPD/asthma exacerbation  and volume overload, ARF, and severe symptomatic anemia  -Cannot exclude ischemic etiology given multiple significant risk factors including ongoing significant tobacco abuse at 2 packs/day for at least 30 years, morbid obesity, HTN, and HLD -Given recent acute CVA and minimal troponin elevation and symptomatic anemia heparin gtt was deferred -Echo with preserved LVSF as above with normal wall motion -Will need an ischemic evaluation down the road when her severe acute illness is improved and comorbid conditions have been evaluated and adequately treated  3.  Acute on chronic HFpEF: -Recommend correction of hypoalbuminemia as this is contributing to third spacing and will be needed to augment diuresis, defer to primary service  -She will benefit from gentle IV diuresis, with preserved LVSF there is no indication for inotropic support  -Start IV Lasix 40 mg daily and assess UOP  4.  Acute encephalopathy: -Uncertain if this is related to hypoxic brain injury with notation of O2 saturations in the 60s% at home versus acute CVA versus hyponatremia -Improving -Management per primary service  5.  Severe symptomatic anemia: -Low, though stable -Status post PRBC this admission -Hematology following -Likely contributing to HFpEF  6.  ARF: -Possibly ATN in the setting of dehydration with decreased oral intake versus overdiuresis, though suspect the former given evidence of volume overload upon presentation -Improving with gentle hydration -Avoid  nephrotoxic agents -Close monitoring   7.  Acute hypoxic respiratory failure: -Wean supplemental oxygen as tolerated -Patient has spent extended time periods relatively immobile laying in bed increasing the possibility for DVT/PE -D-dimer was elevated in the ED -Unable to perform CTA chest secondary to ARF -VQ cancelled due to recurrent hypoxia  -Imaging does not fully exclude atypical infection -Etiology likely multifactorial including volume overload, COPD/asthma exacerbation, HFpEF, morbid obesity with untreated OSA and likely OHS exacerbated by symptomatic severe anemia and third spacing with significant hypoalbuminemia -IV Lasix as above   For questions or updates, please contact [facility] Please consult [email or web address] for contact info under Cardiology/STEMI.    Signed, Eula Listen, PA-C [facility] Pager: [phone] 07/19/2020, 8:55 AM   Cardiology Attending  Patient seen and examined. Agree with the findings as noted above by Eula Listen, PA-C. The patient has a difficult time hearing but reads lips. She is feeling better. She has had improvement in her neuro findings. She probably had atrial fib. Her exam demonstrates a middle aged woman, NAD with scattered rales and no increased work of breathing and RRR with chronic peripheral edema. Tele demonstrates NSR. I agree with plan for TEE. She has multiple cardiac risk factors so an ischemic eval is not unreasonable. I asked her to stop smoking.   Sharlot Gowda Jarrick Fjeld,MD

## 2020-07-19 NOTE — Progress Notes (Signed)
Initial Nutrition Assessment  DOCUMENTATION CODES:   Obesity unspecified  INTERVENTION:   -Ensure Enlive po BID, each supplement provides 350 kcal and 20 grams of protein  NUTRITION DIAGNOSIS:   Inadequate oral intake related to lethargy/confusion as evidenced by per patient/family report.  GOAL:   Patient will meet greater than or equal to 90% of their needs  MONITOR:   PO intake,Supplement acceptance,Labs,Weight trends,I & O's  REASON FOR ASSESSMENT:   Consult Assessment of nutrition requirement/status  ASSESSMENT:   55 year old female with a past medical history significant for asthma, OSA, and fibromyalgia who presented to [facility] ED on 07/16/2020 due to complaints of shortness of breath and altered mental status. Admitted for acute frontal lobe CVA and acute hypoxic respiratory failure.  Patient currently alert/oriented x 3. Per family report pt has not been eating well for 2 weeks now. Has not been feeling well, coughing a lot d/t heavy smoking. She has had some instances of diarrhea as well.   SLP evaluated 6/3 and recommended dysphagia 2 diet d/t pt's poor dentition.  Per weight records, pt has lost 20 lbs since 5/4 (9% wt loss x 1 month). Possible some fluid involved.  I/Os: -1.67L since admit  Medications: Folic acid, IV Lasix, KLOR-CON   Labs reviewed:  Low K  NUTRITION - FOCUSED PHYSICAL EXAM:  Unable to complete  Diet Order:   Diet Order            DIET DYS 2 Room service appropriate? Yes; Fluid consistency: Thin  Diet effective now                 EDUCATION NEEDS:   No education needs have been identified at this time  Skin:  Skin Assessment: Reviewed RN Assessment  Last BM:  6/2  Height:   Ht Readings from Last 1 Encounters:  07/16/20 4\' 9"  (1.448 m)    Weight:   Wt Readings from Last 1 Encounters:  07/19/20 90.3 kg   BMI:  Body mass index is 43.08 kg/m.  Estimated Nutritional Needs:   Kcal:  1450-1650  Protein:   55-70g  Fluid:  1.6L/day  09/18/20, MS, RD, LDN Inpatient Clinical Dietitian Contact information available via Amion

## 2020-07-19 NOTE — Evaluation (Signed)
Physical Therapy Evaluation Patient Details Name: Brandi Holland MRN: [id number] DOB: Jul 25, 1965 Today's Date: 07/19/2020   History of Present Illness  Pt is a 55 y/o F with PMH: unremarkable due to noncompliance with medical care. Pt presented to ED with 2-week history of feeling unwell/acting confused. Pt was seen at an OSH for concern for fluid overload with CHF as differentials. W/u at [facility] included: MRI showing multiple embolic looking infarctions in bilateral hemispheres concerning for a central embolic source. 2D Echo: unlikely HF, but does show left and right atrial enlargement. Blood cultures + for staph epi. Pt adm for encephalopathy with CVAs and ICU d/t respiratory failure (currently on HHFNC).  Clinical Impression  Pt is a pleasantly confused 55 year old female who was admitted for SOB, respiratory aklalosis, and positive CVA . Pt continues to be confused, but alert and participatory. Pt performs bed mobility with mod assist x 3 attempts. Will need additional assist for OOB mobility, anticipating 2 assist. Unsafe to attempt this date. Sats remained stable throughout exertion. Per husband, pt very independent PTA. Pt demonstrates deficits with strength (L side slight neglect), endurance/cognition. Per notes, planning for TEE Monday, June 6th. Would benefit from skilled PT to address above deficits and promote optimal return to PLOF. At this time, recommend admission to CIR for services to address goals.    Follow Up Recommendations CIR    Equipment Recommendations   (TBD)    Recommendations for Other Services       Precautions / Restrictions Precautions Precautions: Fall Restrictions Weight Bearing Restrictions: No Other Position/Activity Restrictions: HHFNC      Mobility  Bed Mobility Overal bed mobility: Needs Assistance Bed Mobility: Rolling Rolling: Mod assist         General bed mobility comments: able to perform rolling x 3 attempts to L side with mod assist from  therapist. Follows commands but fatigues quickly. Unable to further perform mobility. Kept O2 at 100% with exertion on 7L of HFNC    Transfers                 General transfer comment: unsafe  Ambulation/Gait                Stairs            Wheelchair Mobility    Modified Rankin (Stroke Patients Only)       Balance                                             Pertinent Vitals/Pain Pain Assessment: No/denies pain    Home Living Family/patient expects to be discharged to:: Private residence Living Arrangements: Spouse/significant other Available Help at Discharge: Family;Available PRN/intermittently Type of Home: House Home Access: Stairs to enter;Ramped entrance   Entrance Stairs-Number of Steps: 3, does not have to use entrance with steps Home Layout: One level Home Equipment: Walker - 2 wheels;Cane - single point;Shower seat - built in;Bedside commode;Wheelchair - manual Additional Comments: all above-listed equipment was stored, belonged to patient's grandmother.    Prior Function Level of Independence: Independent         Comments: Pt was INDEP including driving and running errands, was declining and requiring increased assitance from spouse over course of 2 weeks d/t generally feeling poorly.     Hand Dominance        Extremity/Trunk Assessment   Upper  Extremity Assessment Upper Extremity Assessment: Generalized weakness (Slight L UE neglect; over extends past nose with decreased accuracy. Able to clap hands, unable to raise overhead Bilat. Grip 4/5 R UE grossly 3+/5)    Lower Extremity Assessment Lower Extremity Assessment: Generalized weakness (B LE grossly 2/5; DF/PF 3/5;reports "heaviness")       Communication   Communication: HOH  Cognition Arousal/Alertness: Awake/alert Behavior During Therapy: WFL for tasks assessed/performed Overall Cognitive Status: Impaired/Different from baseline                                  General Comments: Able to follow 1 step commands. A&O x 2. History obtained from husband as pt is poor historian. Limited insight into defcits.      General Comments      Exercises Other Exercises Other Exercises: supine ther-ex performed on B LE including AP, hip abd/add, and SLRs. CUes for attention to task and lack of full effort noted. 10 reps with max assist   Assessment/Plan    PT Assessment Patient needs continued PT services  PT Problem List Decreased strength;Decreased balance;Decreased mobility;Decreased knowledge of use of DME;Decreased safety awareness;Cardiopulmonary status limiting activity;Obesity       PT Treatment Interventions DME instruction;Gait training;Therapeutic activities;Therapeutic exercise;Balance training;Neuromuscular re-education    PT Goals (Current goals can be found in the Care Plan section)  Acute Rehab PT Goals Patient Stated Goal: to get stronger and back to being able to care for herself PT Goal Formulation: With patient Time For Goal Achievement: 08/02/20 Potential to Achieve Goals: Good    Frequency 7X/week   Barriers to discharge        Co-evaluation               AM-PAC PT "6 Clicks" Mobility  Outcome Measure Help needed turning from your back to your side while in a flat bed without using bedrails?: A Lot Help needed moving from lying on your back to sitting on the side of a flat bed without using bedrails?: Total Help needed moving to and from a bed to a chair (including a wheelchair)?: Total Help needed standing up from a chair using your arms (e.g., wheelchair or bedside chair)?: Total Help needed to walk in hospital room?: Total Help needed climbing 3-5 steps with a railing? : Total 6 Click Score: 7    End of Session Equipment Utilized During Treatment: Oxygen Activity Tolerance: Patient tolerated treatment well Patient left: in bed;with family/visitor present Nurse Communication:  Mobility status PT Visit Diagnosis: Muscle weakness (generalized) (M62.81);Difficulty in walking, not elsewhere classified (R26.2);Apraxia (R48.2)    Time: 0177-9390 PT Time Calculation (min) (ACUTE ONLY): 23 min   Charges:   PT Evaluation $PT Eval Low Complexity: 1 Low PT Treatments $Therapeutic Exercise: 8-22 mins        Elizabeth Palau, PT, DPT [phone]   Eyonna Sandstrom 07/19/2020, 4:27 PM

## 2020-07-19 NOTE — Progress Notes (Signed)
PROGRESS NOTE    Brandi Holland  VHQ:[id number] DOB: 12/06/1965 DOA: 07/16/2020 PCP: Pcp, No   Brief Narrative: Taken from H&P.  Brandi ReaperKimberly D Ancheta is a 55 y.o. female with medical history significant of asthma, OSA, fibromyalgia who presents via EMS for shortness of breath.  She was hypoxic in 60s requiring 5 to 6 L of oxygen with intermittent nonrebreather. Initially admitting provider unable to obtained any history as there was no family member at bedside, no one could be reached on phone and patient was very altered.  Able to get hold of husband after multiple attempts at bedside, according to him patient was not feeling well for the past few weeks, [facility] with lower extremity edema and was diuresed and discharged on 06/20/2020.  No echocardiogram done, CTA was negative for PE at that time, no significant abnormality on labs except sodium of 122 which improved to 130 and mild AKI, unknown baseline. Patient continued to feel bad and she was staying in bed for the past more than 2 weeks, unable to get out of bed, very poor p.o. intake.  Per husband she did had diarrhea with black color stool and he has seen some blood in stool last week, diarrhea lasted for 2 days and then resolved.  He did not noticed any hematemesis.  Patient does not drink alcohol but is a heavy smoker, smokes at least 2 packs/day.  Husband was able to brought her to ED 2 weeks ago and then she left after waiting couple of hours without being seen.  Per husband she was refusing to go see a doctor, when she continued to deteriorate he asked her son who lives in [place] who drove from there and talked with her and called the EMS. Husband denies any recent upper respiratory illnesses, fever or chills.  He did noticed worsening shortness of breath.  Patient was mostly bedbound for the past 2 weeks, he was using diapers to help her as she was unable to go to the bathroom by herself due to profound  weakness.  Patient remained very altered with intermittent desaturation, ABG with some respiratory alkalosis, no hypoxia.1/2 blood culture bottles with MSSE, MRI brain with multiple bilateral infarcts, worsening renal function so unable to obtain CTA, VQ scan ordered but got canceled due to recurrent desaturation.  D-dimer at 2.6, hemoglobin of 5.7, platelet counts of 97 this morning and there were 255 [facility], BUN of 73 and creatinine of 2.99, sodium of 130, chloride of 96 and bicarb of 21.  Elevated T bili at 2.5 and INR of 1.5, lactic acidosis 3.2>>2.5>>1.9, elevated troponin 708>>904, BNP at 1893, CT chest and abdomen with bilateral groundglass opacities which can be due to pulmonary edema versus atypical pneumonia and no significant abnormality on CT abdomen.  Echocardiogram with EF of 50 to 55%, which is within lower normal limit, no wall motion abnormalities and indeterminate diastolic parameters, mildly biatrial dilatation and dilated inferior vena cava with less than 50% variability. Received total of 3 units PRBC 6/3:Had swallow evaluation today and they are recommending dysphagia 2 diet. 6/4: Cardiology started her on diuresis with IV Lasix 40 mg daily, some improvement in breathing status.  Subjective: Patient was seen and examined today.  Seems more alert and able to answer some questions.  She knows that she is in hospital.  Husband at bedside.  Assessment & Plan:   Principal Problem:   Acute encephalopathy Active Problems:   CVA (  cerebral vascular accident) (HCC)   Asthma   Acute respiratory failure with hypoxia (HCC)   AKI (acute kidney injury) (HCC)   Hyponatremia   OSA (obstructive sleep apnea)   AMS (altered mental status)   Acute on chronic diastolic heart failure (HCC)   Folate deficiency   Hyperbilirubinemia  Severe acute encephalopathy.  Improving  Unknown etiology and can be multifactorial with bilateral strokes. No obvious source of  infection except 1 out of 2 blood culture with MSSE which is most likely a contaminant, procalcitonin at 0.35>>0.27, concern of cardiac embolic source which can be due to endocarditis or a thrombus secondary to heart failure?? -Continue to monitor  Acute hypoxic respiratory failure.  Patient has an history of heavy smoking but no underlying diagnosis of COPD, not on home oxygen.  Initially required nonrebreather and then later switched to heated high flow.  Currently on 35 L with 40% of FiO2 Might be due to encephalopathy versus pulmonary edema.  Elevated D-dimer but unable to obtain CTA due to AKI, VQ scan got canceled due to recurrent desaturation. Cardiology started her on IV Lasix Continue with supplemental oxygen-wean as tolerated.  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  Might have GI bleed as husband was mentioning about dark color stools while she was having diarrhea for 2 days last week.  No recurrence at this time. Anemia panel with iron and folate deficiency.  Patient received 1 unit while in the ED, and 2 L next day.  Hemoglobin at 8 today, also received IV iron X 2 doses.  Worsening thrombocytopenia, platelet at 61 today, no obvious bleeding.  Hematology is involved-appreciate their help.  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  Rest of the labs pending. -Monitor CBC -Check FOBT-still pending -Can involve GI-hold at this time as she is not actively bleeding and she might have mucosal bleed with thrombocytopenia.  CT abdomen with no concern of cirrhosis.  HFpEF/elevated troponin.  No prior history of heart failure. Echocardiogram with low normal EF and indeterminant diastolic dysfunction, most likely diastolic heart failure.  Lung sounds quite wet. Cardiology is on board. -Cardiology started her on Lasix 40 mg IV daily. -Daily BMP and weight -Strict intake and output  Hypoalbuminemia.  Causing some third spacing. -Give her albumin IV to  help with diuresis and third spacing.  Bilateral multifocal cerebral infarct.  Most likely secondary to cardiac emboli, TTE without any obvious source, will need TEE and rule out PFO.  Extremity Doppler scans were negative for any DVT, 1 small acute superficial thrombophlebitis in upper extremity. MRA without any significant stenosis. Neurology is on board and patient is being worked up for stroke. No anticoagulation at this time due to severe anemia and thrombocytopenia.  1/2 blood cultures with MSSE.  Most likely a contaminant.  Patient is afebrile with no leukocytosis, borderline procalcitonin.  Cannot ignore it due to her acute illness and concern of endocarditis. Patient received 1 dose of cefepime and 2 doses of vancomycin earlier during admission. -Repeat blood culture -ordered yesterday but apparently never get drawn. -ID consult-started her on cefazolin based on her risk factor. -TTE without any significant abnormality -Possible TEE to rule out cardiac source of emboli-most likely be done next week once more stable.  Hyponatremia.  Resolved Sodium was 122 during recent admission at [facility] earlier in May, improved to 135 today.  Most likely secondary to poor p.o. intake and volume overload. -Patient is on maintenance IV fluid. -Monitor sodium  AKI.  Unknown baseline, continue to  improve, at 1.38 today.  It was 1.2 during her recent admission at [facility].  Renal ultrasound without any significant abnormality.  Foley was placed for strict intake and output. -Monitor renal function -Avoid nephrotoxins -Discontinue Foley today and give her a voiding trial as she is more alert now.  Stage III obesity. Estimated body mass index is 43.08 kg/m as calculated from the following:   Height as of this encounter:  (1.448 m).   Weight as of this encounter: 90.3 kg.  This will complicate overall prognosis.  Objective: Vitals:   07/19/20 1100 07/19/20 1200 07/19/20 1300 07/19/20 1310  BP: (!)  104/56 (!) 99/53 (!) 94/49   Pulse: 86 82 86 83  Resp: (!) 22 16 (!) 23 (!) 22  Temp:      TempSrc:      SpO2: 100% 100% 100% 100%  Weight:      Height:        Intake/Output Summary (Last 24 hours) at 07/19/2020 1511 Last data filed at 07/19/2020 1257 Gross per 24 hour  Intake --  Output 2300 ml  Net -2300 ml   Filed Weights   07/16/20 2011 07/18/20 0500 07/19/20 0500  Weight: 105 kg 91.7 kg 90.3 kg    Examination:  General.  Chronically ill-appearing obese lady, in no acute distress, hard of hearing. Pulmonary.  Few scattered crackles bilaterally, normal respiratory effort. CV.  Regular rate and rhythm, no JVD, rub or murmur. Abdomen.  Soft, nontender, nondistended, BS positive. CNS.  Alert and oriented x2.  No focal neurologic deficit. Extremities.  1+ LE edema, no cyanosis, pulses intact and symmetrical. Psychiatry.  Judgment and insight appears impaired.  DVT prophylaxis: SCDs, patient has severe anemia and thrombocytopenia. Code Status: Full Family Communication: Discussed with husband at bedside. Disposition Plan:  Status is: Inpatient  Remains inpatient appropriate because:Inpatient level of care appropriate due to severity of illness   Dispo: The patient is from: Home              Anticipated d/c is to: To be determined              Patient currently is not medically stable to d/c.   Difficult to place patient No               Level of care: Stepdown  All the records are reviewed and case discussed with Care Management/Social Worker. Management plans discussed with the patient, nursing and they are in agreement.  Consultants:   Neurology  Cardiology  ID  Hematology  Procedures:  Antimicrobials:  Cefazolin  Data Reviewed: I have personally reviewed following labs and imaging studies  CBC: Recent Labs  Lab 07/17/20 1227 07/18/20 0115 07/18/20 0555 07/18/20 1216 07/19/20 0345  WBC 6.3 7.6 7.1 6.7 5.4  NEUTROABS 4.5 5.7  --   --  3.6  HGB  6.5*  6.6* 8.1* 8.0* 8.3* 8.0*  HCT 20.6*  20.9* 24.9* 25.7* 25.9* 25.0*  MCV 96.7 94.7 95.9 94.9 95.1  PLT 80* 79* 77* 73* 61*   Basic Metabolic Panel: Recent Labs  Lab 07/16/20 2016 07/17/20 0534 07/18/20 0115 07/19/20 0345  NA 130* 130* 134* 135  K 4.3 4.5 3.7 3.0*  CL 93* 96* 100 103  CO2 22 21* 21* 24  GLUCOSE 123* 148* 120* 100*  BUN 70* 73* 72* 44*  CREATININE 3.15* 2.99* 2.41* 1.38*  CALCIUM 8.8* 8.6* 8.9 8.3*  MG  --   --   --  2.2  PHOS  --   --  5.1* 2.5   GFR: Estimated Creatinine Clearance: 42.6 mL/min (A) (by C-G formula based on SCr of 1.38 mg/dL (H)). Liver Function Tests: Recent Labs  Lab 07/16/20 2016 07/17/20 0534 07/18/20 0115 07/18/20 1216 07/19/20 0345  AST 30 25  --   --   --   ALT 12 11  --   --   --   ALKPHOS 64 57  --   --   --   BILITOT 2.6* 2.5*  --  2.6*  --   PROT 7.0 6.7  --   --   --   ALBUMIN 2.8* 2.7* 2.9*  --  2.3*   Recent Labs  Lab 07/16/20 2016  LIPASE 32   Recent Labs  Lab 07/18/20 0837  AMMONIA 17   Coagulation Profile: Recent Labs  Lab 07/17/20 0018 07/17/20 0534  INR 1.5* 1.5*   Cardiac Enzymes: Recent Labs  Lab 07/16/20 2016  CKTOTAL 266*   BNP (last 3 results) No results for input(s): PROBNP in the last 8760 hours. HbA1C: Recent Labs    07/17/20 0901  HGBA1C 5.0   CBG: Recent Labs  Lab 07/18/20 0022  GLUCAP 112*   Lipid Profile: Recent Labs    07/17/20 0534 07/17/20 1237  CHOL 76  --   HDL 13*  --   LDLCALC NOT CALCULATED  --   TRIG 69  --   CHOLHDL 5.8  --   LDLDIRECT  --  46.4   Thyroid Function Tests: Recent Labs    07/17/20 1600  TSH 3.567   Anemia Panel: Recent Labs    07/16/20 2016 07/17/20 0534 07/17/20 1432  VITAMINB12 659  --   --   FOLATE  --  4.0*  --   FERRITIN  --  108  --   TIBC  --  389  --   IRON  --  24*  --   RETICCTPCT  --   --  6.2*   Sepsis Labs: Recent Labs  Lab 07/16/20 2016 07/17/20 0534 07/17/20 0901 07/17/20 1227 07/18/20 0115   PROCALCITON 0.35 0.34  --   --  0.27  LATICACIDVEN 3.2*  --  2.5* 1.9  --     Recent Results (from the past 240 hour(s))  Blood culture (single)     Status: Abnormal   Collection Time: 07/16/20  8:07 PM   Specimen: BLOOD  Result Value Ref Range Status   Specimen Description   Final    BLOOD LEFT ANTECUBITAL Performed at [facility], [address] [place], [place] [address]    Special Requests   Final    BOTTLES DRAWN AEROBIC AND ANAEROBIC Blood Culture results may not be optimal due to an inadequate volume of blood received in culture bottles Performed at [facility], [address] [place], [place] [address]    Culture  Setup Time   Final    GRAM POSITIVE COCCI IN BOTH AEROBIC AND ANAEROBIC BOTTLES CRITICAL RESULT CALLED TO, READ BACK BY AND VERIFIED WITH: DEVIN MITCHELL 1200 07/17/2020 [facility]    Culture (A)  Final    STAPHYLOCOCCUS EPIDERMIDIS STAPHYLOCOCCUS CAPITIS THE SIGNIFICANCE OF ISOLATING THIS ORGANISM FROM A SINGLE SET OF BLOOD CULTURES WHEN MULTIPLE SETS ARE DRAWN IS UNCERTAIN. PLEASE NOTIFY THE MICROBIOLOGY DEPARTMENT WITHIN ONE WEEK IF SPECIATION AND SENSITIVITIES ARE REQUIRED. Performed at [facility], [address], [place], [place] [address]    Report Status 07/19/2020 FINAL  Final  Blood Culture ID Panel (Reflexed)  Status: Abnormal   Collection Time: 07/16/20  8:07 PM  Result Value Ref Range Status   Enterococcus faecalis NOT DETECTED NOT DETECTED Final   Enterococcus Faecium NOT DETECTED NOT DETECTED Final   Listeria monocytogenes NOT DETECTED NOT DETECTED Final   Staphylococcus species DETECTED (A) NOT DETECTED Final    Comment: CRITICAL RESULT CALLED TO, READ BACK BY AND VERIFIED WITH: DEVIN MITCHELL 1200 07/17/2020 DLB    Staphylococcus aureus (BCID) NOT DETECTED NOT DETECTED Final   Staphylococcus epidermidis DETECTED (A) NOT DETECTED Final    Comment: CRITICAL RESULT CALLED TO, READ BACK BY AND VERIFIED WITH: DEVIN MITCHELL  1200 07/17/2020 DLB    Staphylococcus lugdunensis NOT DETECTED NOT DETECTED Final   Streptococcus species NOT DETECTED NOT DETECTED Final   Streptococcus agalactiae NOT DETECTED NOT DETECTED Final   Streptococcus pneumoniae NOT DETECTED NOT DETECTED Final   Streptococcus pyogenes NOT DETECTED NOT DETECTED Final   A.calcoaceticus-baumannii NOT DETECTED NOT DETECTED Final   Bacteroides fragilis NOT DETECTED NOT DETECTED Final   Enterobacterales NOT DETECTED NOT DETECTED Final   Enterobacter cloacae complex NOT DETECTED NOT DETECTED Final   Escherichia coli NOT DETECTED NOT DETECTED Final   Klebsiella aerogenes NOT DETECTED NOT DETECTED Final   Klebsiella oxytoca NOT DETECTED NOT DETECTED Final   Klebsiella pneumoniae NOT DETECTED NOT DETECTED Final   Proteus species NOT DETECTED NOT DETECTED Final   Salmonella species NOT DETECTED NOT DETECTED Final   Serratia marcescens NOT DETECTED NOT DETECTED Final   Haemophilus influenzae NOT DETECTED NOT DETECTED Final   Neisseria meningitidis NOT DETECTED NOT DETECTED Final   Pseudomonas aeruginosa NOT DETECTED NOT DETECTED Final   Stenotrophomonas maltophilia NOT DETECTED NOT DETECTED Final   Candida albicans NOT DETECTED NOT DETECTED Final   Candida auris NOT DETECTED NOT DETECTED Final   Candida glabrata NOT DETECTED NOT DETECTED Final   Candida krusei NOT DETECTED NOT DETECTED Final   Candida parapsilosis NOT DETECTED NOT DETECTED Final   Candida tropicalis NOT DETECTED NOT DETECTED Final   Cryptococcus neoformans/gattii NOT DETECTED NOT DETECTED Final   Methicillin resistance mecA/C NOT DETECTED NOT DETECTED Final    Comment: Performed at [facility], [address] [place], [place] [address]  Resp Panel by RT-PCR (Flu A&B, Covid) Nasopharyngeal Swab     Status: None   Collection Time: 07/16/20  8:16 PM   Specimen: Nasopharyngeal Swab; Nasopharyngeal(NP) swabs in vial transport medium  Result Value Ref Range Status   SARS  Coronavirus 2 by RT PCR NEGATIVE NEGATIVE Final    Comment: (NOTE) SARS-CoV-2 target nucleic acids are NOT DETECTED.  The SARS-CoV-2 RNA is generally detectable in upper respiratory specimens during the acute phase of infection. The lowest concentration of SARS-CoV-2 viral copies this assay can detect is 138 copies/mL. A negative result does not preclude SARS-Cov-2 infection and should not be used as the sole basis for treatment or other patient management decisions. A negative result may occur with  improper specimen collection/handling, submission of specimen other than nasopharyngeal swab, presence of viral mutation(s) within the areas targeted by this assay, and inadequate number of viral copies(<138 copies/mL). A negative result must be combined with clinical observations, patient history, and epidemiological information. The expected result is Negative.  Fact Sheet for Patients:  BloggerCourse.com  Fact Sheet for Healthcare Providers:  SeriousBroker.it  This test is no t yet approved or cleared by the [place] FDA and  has been authorized for detection and/or diagnosis of SARS-CoV-2 by FDA under an Emergency  Use Authorization (EUA). This EUA will remain  in effect (meaning this test can be used) for the duration of the COVID-19 declaration under Section 564(b)(1) of the Act, 21 U.S.C.section 360bbb-3(b)(1), unless the authorization is terminated  or revoked sooner.       Influenza A by PCR NEGATIVE NEGATIVE Final   Influenza B by PCR NEGATIVE NEGATIVE Final    Comment: (NOTE) The Xpert Xpress SARS-CoV-2/FLU/RSV plus assay is intended as an aid in the diagnosis of influenza from Nasopharyngeal swab specimens and should not be used as a sole basis for treatment. Nasal washings and aspirates are unacceptable for Xpert Xpress SARS-CoV-2/FLU/RSV testing.  Fact Sheet for  Patients: BloggerCourse.com  Fact Sheet for Healthcare Providers: SeriousBroker.it  This test is not yet approved or cleared by the [place] FDA and has been authorized for detection and/or diagnosis of SARS-CoV-2 by FDA under an Emergency Use Authorization (EUA). This EUA will remain in effect (meaning this test can be used) for the duration of the COVID-19 declaration under Section 564(b)(1) of the Act, 21 U.S.C. section 360bbb-3(b)(1), unless the authorization is terminated or revoked.  Performed at [facility], [address] [place], [place] [address]   MRSA PCR Screening     Status: None   Collection Time: 07/18/20 12:38 AM   Specimen: Nasal Mucosa; Nasopharyngeal  Result Value Ref Range Status   MRSA by PCR NEGATIVE NEGATIVE Final    Comment:        The GeneXpert MRSA Assay (FDA approved for NASAL specimens only), is one component of a comprehensive MRSA colonization surveillance program. It is not intended to diagnose MRSA infection nor to guide or monitor treatment for MRSA infections. Performed at [facility], [address] [place], [place] [address]   CULTURE, BLOOD (ROUTINE X 2) w Reflex to ID Panel     Status: None (Preliminary result)   Collection Time: 07/18/20  3:13 PM   Specimen: BLOOD  Result Value Ref Range Status   Specimen Description BLOOD BLOOD RIGHT FOREARM  Final   Special Requests   Final    BOTTLES DRAWN AEROBIC AND ANAEROBIC Blood Culture adequate volume   Culture   Final    NO GROWTH < 24 HOURS Performed at [facility], [address], [place], [place] [address]    Report Status PENDING  Incomplete  CULTURE, BLOOD (ROUTINE X 2) w Reflex to ID Panel     Status: None (Preliminary result)   Collection Time: 07/18/20  3:17 PM   Specimen: BLOOD  Result Value Ref Range Status   Specimen Description BLOOD BLOOD RIGHT HAND  Final   Special Requests   Final     BOTTLES DRAWN AEROBIC AND ANAEROBIC Blood Culture adequate volume   Culture   Final    NO GROWTH < 24 HOURS Performed at [facility], [address], [place], [place] [address]    Report Status PENDING  Incomplete     Radiology Studies: US Abdomen Complete  Result Date: 07/17/2020 CLINICAL DATA:  Hyperbilirubinemia EXAM: ABDOMEN ULTRASOUND COMPLETE COMPARISON:  07/16/2020, 07/17/2020 FINDINGS: Gallbladder: Surgically absent Common bile duct: Diameter: 2 mm Liver: No focal lesion identified. Within normal limits in parenchymal echogenicity. Portal vein is patent on color Doppler imaging with normal direction of blood flow towards the liver. IVC: No abnormality visualized. Pancreas: Visualized portion unremarkable. Spleen: Size and appearance within normal limits. Right Kidney: Length: 9.3 cm. Echogenicity within normal limits. No mass or hydronephrosis visualized. Left Kidney: Length:  10.4 cm. Echogenicity within normal limits. No mass or hydronephrosis visualized. Abdominal aorta: Proximal aorta is unremarkable measuring up to 2.7 cm. Distal aorta and bifurcation obscured by bowel gas. Other findings: None. IMPRESSION: 1. Grossly unremarkable abdominal ultrasound in this patient status post cholecystectomy. Electronically Signed   By: Sharlet Salina M.D.   On: 07/17/2020 23:30   US RENAL  Result Date: 07/17/2020 CLINICAL DATA:  Acute kidney injury EXAM: RENAL / URINARY TRACT ULTRASOUND COMPLETE COMPARISON:  None. FINDINGS: Right Kidney: Renal measurements: 10.3 x 3.8 x 4.4 cm = volume: 90 mL. Echogenicity within normal limits. No mass or hydronephrosis visualized. Left Kidney: Renal measurements: 9.6 x 4.8 x 4.3 cm = volume: 95 mL. Echogenicity within normal limits. No mass or hydronephrosis visualized. Bladder: Appears normal for degree of bladder distention. Other: None. IMPRESSION: No evidence of hydronephrosis or other acute renal abnormality. Electronically Signed   By: Malachy Moan M.D.   On: 07/17/2020 15:29   US Venous Img Lower Bilateral (DVT)  Result Date: 07/18/2020 CLINICAL DATA:  Bilateral lower extremity swelling for the past 3 weeks. Evaluate for DVT. EXAM: BILATERAL LOWER EXTREMITY VENOUS DOPPLER ULTRASOUND TECHNIQUE: Gray-scale sonography with graded compression, as well as color Doppler and duplex ultrasound were performed to evaluate the lower extremity deep venous systems from the level of the common femoral vein and including the common femoral, femoral, profunda femoral, popliteal and calf veins including the posterior tibial, peroneal and gastrocnemius veins when visible. The superficial great saphenous vein was also interrogated. Spectral Doppler was utilized to evaluate flow at rest and with distal augmentation maneuvers in the common femoral, femoral and popliteal veins. COMPARISON:  None. FINDINGS: Examination is degraded due to patient body habitus and poor sonographic window. RIGHT LOWER EXTREMITY Common Femoral Vein: No evidence of thrombus. Normal compressibility, respiratory phasicity and response to augmentation. Saphenofemoral Junction: No evidence of thrombus. Normal compressibility and flow on color Doppler imaging. Profunda Femoral Vein: No evidence of thrombus. Normal compressibility and flow on color Doppler imaging. Femoral Vein: No evidence of thrombus. Normal compressibility, respiratory phasicity and response to augmentation. Popliteal Vein: No evidence of thrombus. Normal compressibility, respiratory phasicity and response to augmentation. Calf Veins: No evidence of thrombus. Normal compressibility and flow on color Doppler imaging. Superficial Great Saphenous Vein: No evidence of thrombus. Normal compressibility. Venous Reflux:  None. Other Findings:  None. LEFT LOWER EXTREMITY Common Femoral Vein: No evidence of thrombus. Normal compressibility, respiratory phasicity and response to augmentation. Saphenofemoral Junction: No evidence of  thrombus. Normal compressibility and flow on color Doppler imaging. Profunda Femoral Vein: No evidence of thrombus. Normal compressibility and flow on color Doppler imaging. Femoral Vein: No evidence of thrombus. Normal compressibility, respiratory phasicity and response to augmentation. Popliteal Vein: No evidence of thrombus. Normal compressibility, respiratory phasicity and response to augmentation. Calf Veins: No evidence of thrombus. Normal compressibility and flow on color Doppler imaging. Superficial Great Saphenous Vein: No evidence of thrombus. Normal compressibility. Venous Reflux:  None. Other Findings:  None. IMPRESSION: No evidence of DVT within either lower extremity. Electronically Signed   By: Simonne Come M.D.   On: 07/18/2020 14:41   US Venous Img Upper Bilat (DVT)  Result Date: 07/18/2020 CLINICAL DATA:  Bilateral upper extremity pain and edema for the past several weeks. Evaluate for DVT. EXAM: BILATERAL UPPER EXTREMITY VENOUS DOPPLER ULTRASOUND TECHNIQUE: Gray-scale sonography with graded compression, as well as color Doppler and duplex ultrasound were performed to evaluate the bilateral upper extremity deep venous systems from the  level of the subclavian vein and including the jugular, axillary, basilic, radial, ulnar and upper cephalic vein. Spectral Doppler was utilized to evaluate flow at rest and with distal augmentation maneuvers. COMPARISON:  None. FINDINGS: RIGHT UPPER EXTREMITY Internal Jugular Vein: No evidence of thrombus. Normal compressibility, respiratory phasicity and response to augmentation. Subclavian Vein: No evidence of thrombus. Normal compressibility, respiratory phasicity and response to augmentation. Axillary Vein: No evidence of thrombus. Normal compressibility, respiratory phasicity and response to augmentation. Cephalic Vein: No evidence of thrombus. Normal compressibility, respiratory phasicity and response to augmentation. Basilic Vein: No evidence of thrombus.  Normal compressibility, respiratory phasicity and response to augmentation. Brachial Veins: No evidence of thrombus. Normal compressibility, respiratory phasicity and response to augmentation. Radial Veins: No evidence of thrombus. Normal compressibility, respiratory phasicity and response to augmentation. Ulnar Veins: No evidence of thrombus. Normal compressibility, respiratory phasicity and response to augmentation. Venous Reflux:  None. Other Findings:  None. LEFT UPPER EXTREMITY Internal Jugular Vein: No evidence of thrombus. Normal compressibility, respiratory phasicity and response to augmentation. Subclavian Vein: No evidence of thrombus. Normal compressibility, respiratory phasicity and response to augmentation. Axillary Vein: No evidence of thrombus. Normal compressibility, respiratory phasicity and response to augmentation. Cephalic Vein: While the proximal aspect of the cephalic vein appears widely patent (image 9), there is hypoechoic occlusive thrombus involving the mid aspect of the cephalic vein at the level of the mid humerus (images 10 and 12). Basilic Vein: No evidence of thrombus. Normal compressibility, respiratory phasicity and response to augmentation. Brachial Veins: No evidence of thrombus. Normal compressibility, respiratory phasicity and response to augmentation. Radial Veins: No evidence of thrombus. Normal compressibility, respiratory phasicity and response to augmentation. Ulnar Veins: No evidence of thrombus. Normal compressibility, respiratory phasicity and response to augmentation. Venous Reflux:  None. Other Findings:  None. IMPRESSION: 1. No evidence of DVT within either upper extremity. 2. Examination is positive for short-segment occlusive superficial thrombophlebitis involving mid humeral aspect of the cephalic vein. Electronically Signed   By: Simonne Come M.D.   On: 07/18/2020 14:40   DG Chest Port 1 View  Result Date: 07/18/2020 CLINICAL DATA:  Acute respiratory failure with  hypoxia EXAM: PORTABLE CHEST 1 VIEW COMPARISON:  07/16/2020 FINDINGS: Single frontal view of the chest demonstrates an enlarged cardiac silhouette. Stable diffuse ground-glass airspace disease compatible with edema. No effusion or pneumothorax. No acute bony abnormalities. IMPRESSION: 1. Persistent bilateral ground-glass airspace disease consistent with pulmonary edema or atypical infection. No change since prior exam. Electronically Signed   By: Sharlet Salina M.D.   On: 07/18/2020 01:52    Scheduled Meds: .  stroke: mapping our early stages of recovery book   Does not apply Once  . budesonide (PULMICORT) nebulizer solution  0.5 mg Nebulization BID  . Chlorhexidine Gluconate Cloth  6 each Topical Q0600  . feeding supplement  237 mL Oral BID BM  . folic acid  1 mg Oral Daily  . ipratropium-albuterol  3 mL Nebulization Q6H  . mouth rinse  15 mL Mouth Rinse BID  . nicotine  21 mg Transdermal Daily  . potassium chloride  30 mEq Oral BID  . sodium chloride flush  3 mL Intravenous Q12H   Continuous Infusions: .  ceFAZolin (ANCEF) IV       LOS: 2 days   Time spent: 40 minutes. More than 50% of the time was spent in counseling/coordination of care. Patient is critically sick with multiorgan dysfunction and high risk for deterioration and death.  Arnetha Courser, MD [facility]  If 7PM-7AM, please contact night-coverage [email or web address]  07/19/2020, 3:11 PM   This record has been created using Conservation officer, historic buildings. Errors have been sought and corrected,but may not always be located. Such creation errors do not reflect on the standard of care.

## 2020-07-19 NOTE — Progress Notes (Signed)
Neurology Progress Note  S:// Mental status much improved. Oriented to self, place  O:// Current vital signs: BP (!) 97/48 (BP Location: Right Arm)   Pulse 86   Temp 98 F (36.7 C) (Axillary)   Resp 15   Ht 4\' 9"  (1.448 m)   Wt 90.3 kg   SpO2 100%   BMI 43.08 kg/m  Vital signs in last 24 hours: Temp:  [97.9 F (36.6 C)-100.2 F (37.9 C)] 98 F (36.7 C) (06/04 0900) Pulse Rate:  [66-86] 86 (06/04 0900) Resp:  [15-26] 15 (06/04 0900) BP: (92-142)/(39-93) 97/48 (06/04 0900) SpO2:  [92 %-100 %] 100 % (06/04 0900) FiO2 (%):  [40 %] 40 % (06/04 0900) Weight:  [90.3 kg] 90.3 kg (06/04 0500) General: Awake alert in no distress on HFNC HEENT: Normocephalic atraumatic Lungs: Scattered wheezing CVS: Regular rhythm Abdomen nondistended nontender Extremities with edema Neurological exam Awake alert oriented to self, place, told me it is May instead of June. Was able to tell me who her husband is in his full name. Naming repetition and comprehension intact Mildly reduced attention concentration Cranial examination with mildly disconjugate gaze, no gaze restriction, visual fields full, face symmetric. Motor exam with no drift in any of the 4 extremities Sensation intact to touch all over Coordination with no dysmetria   Medications  Current Facility-Administered Medications:  .   stroke: mapping our early stages of recovery book, , Does not apply, Once, July, MD .  acetaminophen (TYLENOL) tablet 650 mg, 650 mg, Oral, Q6H PRN **OR** acetaminophen (TYLENOL) suppository 650 mg, 650 mg, Rectal, Q6H PRN, Synetta Fail, MD .  bisacodyl (DULCOLAX) suppository 10 mg, 10 mg, Rectal, Daily PRN, Synetta Fail, MD .  budesonide (PULMICORT) nebulizer solution 0.5 mg, 0.5 mg, Nebulization, BID, Synetta Fail D, NP, 0.5 mg at 07/19/20 0801 .  ceFAZolin (ANCEF) IVPB 2g/100 mL premix, 2 g, Intravenous, Q12H, 09/18/20, RPH, Stopped at 07/19/20 1005 .   Chlorhexidine Gluconate Cloth 2 % PADS 6 each, 6 each, Topical, Q0600, 09/18/20, NP, 6 each at 07/19/20 0532 .  folic acid (FOLVITE) tablet 1 mg, 1 mg, Oral, Daily, 09/18/20, MD, 1 mg at 07/18/20 1832 .  furosemide (LASIX) injection 40 mg, 40 mg, Intravenous, Once, Dunn, Ryan M, PA-C .  ipratropium-albuterol (DUONEB) 0.5-2.5 (3) MG/3ML nebulizer solution 3 mL, 3 mL, Nebulization, Q2H PRN, [birth year]1996, MD, 3 mL at 07/18/20 0622 .  ipratropium-albuterol (DUONEB) 0.5-2.5 (3) MG/3ML nebulizer solution 3 mL, 3 mL, Nebulization, Q6H, 09/17/20 D, NP, 3 mL at 07/19/20 0801 .  LORazepam (ATIVAN) injection 0.5 mg, 0.5 mg, Intravenous, Q6H PRN, 09/18/20, MD, 0.5 mg at 07/17/20 1138 .  MEDLINE mouth rinse, 15 mL, Mouth Rinse, BID, Amin, Sumayya, MD .  nicotine (NICODERM CQ - dosed in mg/24 hours) patch 21 mg, 21 mg, Transdermal, Daily, Kasa, Kurian, MD, 21 mg at 07/18/20 1258 .  potassium chloride SA (KLOR-CON) CR tablet 30 mEq, 30 mEq, Oral, BID, Amin, Sumayya, MD .  sodium chloride flush (NS) 0.9 % injection 3 mL, 3 mL, Intravenous, Q12H, 09/17/20, MD, 3 mL at 07/18/20 2214  Labs CBC    Component Value Date/Time   WBC 5.4 07/19/2020 0345   RBC 2.63 (L) 07/19/2020 0345   HGB 8.0 (L) 07/19/2020 0345   HGB 12.7 12/03/2013 2107   HCT 25.0 (L) 07/19/2020 0345   HCT 39.7 12/03/2013 2107   PLT 61 (L) 07/19/2020 0345  PLT 202 12/03/2013 2107   MCV 95.1 07/19/2020 0345   MCV 95 12/03/2013 2107   MCH 30.4 07/19/2020 0345   MCHC 32.0 07/19/2020 0345   RDW 21.7 (H) 07/19/2020 0345   RDW 16.4 (H) 12/03/2013 2107   LYMPHSABS 1.4 07/19/2020 0345   MONOABS 0.2 07/19/2020 0345   EOSABS 0.1 07/19/2020 0345   BASOSABS 0.0 07/19/2020 0345   CMP     Component Value Date/Time   NA 135 07/19/2020 0345   NA 139 12/03/2013 2107   K 3.0 (L) 07/19/2020 0345   K 4.0 12/03/2013 2107   CL 103 07/19/2020 0345   CL 104 12/03/2013 2107   CO2 24 07/19/2020 0345   CO2 29  12/03/2013 2107   GLUCOSE 100 (H) 07/19/2020 0345   GLUCOSE 108 (H) 12/03/2013 2107   BUN 44 (H) 07/19/2020 0345   BUN 4 (L) 12/03/2013 2107   CREATININE 1.38 (H) 07/19/2020 0345   CREATININE 0.91 12/03/2013 2107   CALCIUM 8.3 (L) 07/19/2020 0345   CALCIUM 8.6 12/03/2013 2107   PROT 6.7 07/17/2020 0534   PROT 7.7 12/03/2013 2107   ALBUMIN 2.3 (L) 07/19/2020 0345   ALBUMIN 3.0 (L) 12/03/2013 2107   AST 25 07/17/2020 0534   AST 15 12/03/2013 2107   ALT 11 07/17/2020 0534   ALT 13 (L) 12/03/2013 2107   ALKPHOS 57 07/17/2020 0534   ALKPHOS 121 (H) 12/03/2013 2107   BILITOT 2.6 (H) 07/18/2020 1216   BILITOT 0.2 12/03/2013 2107   GFRNONAA 45 (L) 07/19/2020 0345   GFRNONAA >60 12/03/2013 2107   GFRNONAA >60 04/23/2013 1756   GFRAA >60 12/03/2013 2107   GFRAA >60 04/23/2013 1756    Lipid Panel     Component Value Date/Time   CHOL 76 07/17/2020 0534   TRIG 69 07/17/2020 0534   HDL 13 (L) 07/17/2020 0534   CHOLHDL 5.8 07/17/2020 0534   VLDL 14 07/17/2020 0534   LDLCALC NOT CALCULATED 07/17/2020 0534   LDLDIRECT 46.4 07/17/2020 1237   BNP in 1800s  A1c pending LDL 46  2D echocardiogram with EF of 50 to 55%, left and right atrial enlargement.  Imaging I have reviewed images in epic and the results pertinent to this consultation are: MRI brain with multifocal acute ischemic infarcts largest areas in bilateral frontal lobes concerning for central embolic source. MRA head and neck-motion degraded with no emergent LVO Chest x-ray with cardiomegaly and diffuse interstitial prominence   Assessment: 55 year old with past medical history that is unremarkable due to noncompliance with medical care, with 2-week history of feeling unwell not being awake for long acting confused, seen at an outside hospital for concern for fluid overload with CHF as differentials, presenting with worsening confusion and MRI showing multiple embolic looking infarctions in bilateral hemispheres concerning  for a central embolic source.  2D echocardiogram with normal ejection fraction but left and right atrial enlargement. BNP elevated Given the distribution of strokes and 1 blood culture bottle positive for staph epidermidis, infective endocarditis should also be in the differentials. Other than this, she does have severe anemia and thrombocytopenia for which she is being worked up by hematology. Strokes do not appear watershed, but that could be a possibility too. Systemic processes such as autoimmune processes like lupus should also be in the differential diagnosis.  Recommendations: Keep holding aspirin for now-has anemia, thrombocytopenia as well as concern for endocarditis-less likely per ID. I still think she would benefit from TEE-not just for endocarditis but to look for  a PFO-cardiology planning for Monday. Lower extremity Dopplers. Frequent neurochecks Check SLE panel LDL at goal-no need for statin Management of anemia and thrombocytopenia per primary team, hematology as you are. Appreciate ID and cardiology evaluations and inputs. Medical management per primary team as you are  D/W ICU team on the unit.  We will follow back with you after the TEE on Monday.   -- Milon Dikes, MD Neurologist [facility] Pager: [phone]

## 2020-07-20 DIAGNOSIS — G9340 Encephalopathy, unspecified: Secondary | ICD-10-CM | POA: Diagnosis not present

## 2020-07-20 LAB — CBC WITH DIFFERENTIAL/PLATELET
Abs Immature Granulocytes: 0.1 10*3/uL — ABNORMAL HIGH (ref 0.00–0.07)
Basophils Absolute: 0 10*3/uL (ref 0.0–0.1)
Basophils Relative: 0 %
Eosinophils Absolute: 0.1 10*3/uL (ref 0.0–0.5)
Eosinophils Relative: 2 %
HCT: 27.5 % — ABNORMAL LOW (ref 36.0–46.0)
Hemoglobin: 8.5 g/dL — ABNORMAL LOW (ref 12.0–15.0)
Immature Granulocytes: 2 %
Lymphocytes Relative: 33 %
Lymphs Abs: 1.7 10*3/uL (ref 0.7–4.0)
MCH: 30.7 pg (ref 26.0–34.0)
MCHC: 30.9 g/dL (ref 30.0–36.0)
MCV: 99.3 fL (ref 80.0–100.0)
Monocytes Absolute: 0.3 10*3/uL (ref 0.1–1.0)
Monocytes Relative: 5 %
Neutro Abs: 3.1 10*3/uL (ref 1.7–7.7)
Neutrophils Relative %: 58 %
Platelets: 56 10*3/uL — ABNORMAL LOW (ref 150–400)
RBC: 2.77 MIL/uL — ABNORMAL LOW (ref 3.87–5.11)
RDW: 21.6 % — ABNORMAL HIGH (ref 11.5–15.5)
Smear Review: NORMAL
WBC: 5.3 10*3/uL (ref 4.0–10.5)
nRBC: 1.1 % — ABNORMAL HIGH (ref 0.0–0.2)

## 2020-07-20 LAB — RENAL FUNCTION PANEL
Albumin: 2.5 g/dL — ABNORMAL LOW (ref 3.5–5.0)
Anion gap: 7 (ref 5–15)
BUN: 40 mg/dL — ABNORMAL HIGH (ref 6–20)
CO2: 27 mmol/L (ref 22–32)
Calcium: 8.5 mg/dL — ABNORMAL LOW (ref 8.9–10.3)
Chloride: 102 mmol/L (ref 98–111)
Creatinine, Ser: 1.14 mg/dL — ABNORMAL HIGH (ref 0.44–1.00)
GFR, Estimated: 56 mL/min — ABNORMAL LOW (ref >60)
Glucose, Bld: 122 mg/dL — ABNORMAL HIGH (ref 70–99)
Phosphorus: 1.8 mg/dL — ABNORMAL LOW (ref 2.5–4.6)
Potassium: 4 mmol/L (ref 3.5–5.1)
Sodium: 136 mmol/L (ref 135–145)

## 2020-07-20 LAB — APTT: aPTT: 31 seconds (ref 24–36)

## 2020-07-20 LAB — FIBRINOGEN: Fibrinogen: 323 mg/dL (ref 210–475)

## 2020-07-20 LAB — PROTIME-INR
INR: 1.2 (ref 0.8–1.2)
Prothrombin Time: 15.4 seconds — ABNORMAL HIGH (ref 11.4–15.2)

## 2020-07-20 LAB — LACTATE DEHYDROGENASE: LDH: 299 U/L — ABNORMAL HIGH (ref 98–192)

## 2020-07-20 LAB — IMMATURE PLATELET FRACTION: Immature Platelet Fraction: 9.7 % — ABNORMAL HIGH (ref 1.2–8.6)

## 2020-07-20 MED ORDER — FUROSEMIDE 10 MG/ML IJ SOLN
40.0000 mg | Freq: Every day | INTRAMUSCULAR | Status: DC
  Administered 2020-07-20 – 2020-07-21 (×2): 40 mg via INTRAVENOUS
  Filled 2020-07-20 (×2): qty 4

## 2020-07-20 MED ORDER — PREDNISONE 50 MG PO TABS
60.0000 mg | ORAL_TABLET | Freq: Every day | ORAL | Status: DC
  Administered 2020-07-20 – 2020-07-23 (×3): 60 mg via ORAL
  Filled 2020-07-20 (×2): qty 6
  Filled 2020-07-20: qty 1

## 2020-07-20 MED ORDER — SODIUM PHOSPHATES 45 MMOLE/15ML IV SOLN
30.0000 mmol | Freq: Once | INTRAVENOUS | Status: AC
  Administered 2020-07-20: 30 mmol via INTRAVENOUS
  Filled 2020-07-20 (×2): qty 10

## 2020-07-20 NOTE — Progress Notes (Signed)
No acute events overnight, more alert as the night progressed, moving legs in bed, when asked if they were itching, she stated she has restless leg syndrome.   VS reveiwed  BP 102/65   Pulse 88   Temp 98.5 F (36.9 C) (Oral)   Resp 17   Ht 4\' 9"  (1.448 m)   Wt 90.3 kg   SpO2 96%   BMI 43.08 kg/m   No longer needs ICU  Will sign off at this time Please contact with any questions

## 2020-07-20 NOTE — Progress Notes (Signed)
No acute events overnight, more alert as the night progressed, moving legs in bed, when asked if they were itching, she stated she has restless leg syndrome.

## 2020-07-20 NOTE — Plan of Care (Addendum)
Approached by primary team and hematology re: ASA given recent strokes and thrombocytopenia.  Platelet count downtrending.today 56k Started on Prednisone by hematology.  OK to hold ASA till platelet counts stabilize (closer to 100K consistently) We will follow after TEE tomorrow D/W Dr. Nelson Chimes and Dr. Donneta Romberg over secure chat  -- Milon Dikes, MD Neurologist [facility] Pager: [phone]

## 2020-07-20 NOTE — Progress Notes (Addendum)
Progress Note  Patient Name: Brandi Holland Date of Encounter: 07/20/2020  Primary Cardiologist: New to [facility] - consult by Kirke Corin  Subjective   Started on IV Lasix 40 mg 6/4 with documented UOP 1.7 L for the past 24 hours and net - 2.1 L for the admission. Renal function continues to improve. Albumin remains low at 2.5. HGB low, though stable at 8.5. No chest pain or reported dyspnea.   Inpatient Medications    Scheduled Meds: .  stroke: mapping our early stages of recovery book   Does not apply Once  . budesonide (PULMICORT) nebulizer solution  0.5 mg Nebulization BID  . Chlorhexidine Gluconate Cloth  6 each Topical Q0600  . feeding supplement  237 mL Oral BID BM  . folic acid  1 mg Oral Daily  . ipratropium-albuterol  3 mL Nebulization Q6H  . mouth rinse  15 mL Mouth Rinse BID  . nicotine  21 mg Transdermal Daily  . sodium chloride flush  3 mL Intravenous Q12H   Continuous Infusions: . albumin human 25 g (07/19/20 1532)  .  ceFAZolin (ANCEF) IV Stopped (07/20/20 0546)  . sodium phosphate  Dextrose 5% IVPB     PRN Meds: acetaminophen **OR** acetaminophen, bisacodyl, ipratropium-albuterol, LORazepam   Vital Signs    Vitals:   07/20/20 0400 07/20/20 0500 07/20/20 0600 07/20/20 0700  BP: 107/67 121/66 102/65 122/63  Pulse:   88 88  Resp: (!) 27 (!) 22 17 (!) 23  Temp:      TempSrc:      SpO2:   96% 98%  Weight:      Height:        Intake/Output Summary (Last 24 hours) at 07/20/2020 0800 Last data filed at 07/20/2020 0700 Gross per 24 hour  Intake 520 ml  Output 2275 ml  Net -1755 ml   Filed Weights   07/16/20 2011 07/18/20 0500 07/19/20 0500  Weight: 105 kg 91.7 kg 90.3 kg    Telemetry    SR - Personally Reviewed  ECG    No new tracings - Personally Reviewed  Physical Exam   GEN: No acute distress. Tremor noted. HOH. Neck: No JVD. Cardiac: RRR, no murmurs, rubs, or gallops.  Respiratory: Diminished breath sounds bilaterally.  GI: Soft, nontender,  non-distended.   MS: No edema; No deformity. Neuro:  Alert; Nonfocal.  Psych: Normal affect.  Labs    Chemistry Recent Labs  Lab 07/16/20 2016 07/17/20 0534 07/18/20 0115 07/18/20 1216 07/19/20 0345 07/20/20 0420  NA 130* 130* 134*  --  135 136  K 4.3 4.5 3.7  --  3.0* 4.0  CL 93* 96* 100  --  103 102  CO2 22 21* 21*  --  24 27  GLUCOSE 123* 148* 120*  --  100* 122*  BUN 70* 73* 72*  --  44* 40*  CREATININE 3.15* 2.99* 2.41*  --  1.38* 1.14*  CALCIUM 8.8* 8.6* 8.9  --  8.3* 8.5*  PROT 7.0 6.7  --   --   --   --   ALBUMIN 2.8* 2.7* 2.9*  --  2.3* 2.5*  AST 30 25  --   --   --   --   ALT 12 11  --   --   --   --   ALKPHOS 64 57  --   --   --   --   BILITOT 2.6* 2.5*  --  2.6*  --   --  GFRNONAA 17* 18* 23*  --  45* 56*  ANIONGAP 15 13 13   --  8 7     Hematology Recent Labs  Lab 07/18/20 1216 07/19/20 0345 07/20/20 0420  WBC 6.7 5.4 5.3  RBC 2.73* 2.63* 2.77*  HGB 8.3* 8.0* 8.5*  HCT 25.9* 25.0* 27.5*  MCV 94.9 95.1 99.3  MCH 30.4 30.4 30.7  MCHC 32.0 32.0 30.9  RDW 21.8* 21.7* 21.6*  PLT 73* 61* 56*    Cardiac EnzymesNo results for input(s): TROPONINI in the last 168 hours. No results for input(s): TROPIPOC in the last 168 hours.   BNP Recent Labs  Lab 07/17/20 0025 07/17/20 0534  BNP 1,768.8* 1,893.4*     DDimer  Recent Labs  Lab 07/16/20 2016  DDIMER 2.60*     Radiology    2017 Abdomen Complete  Result Date: 07/17/2020 IMPRESSION: 1. Grossly unremarkable abdominal ultrasound in this patient status post cholecystectomy. Electronically Signed   By: 09/16/2020 M.D.   On: 07/17/2020 23:30   09/16/2020 RENAL  Result Date: 07/17/2020 IMPRESSION: No evidence of hydronephrosis or other acute renal abnormality. Electronically Signed   By: 09/16/2020 M.D.   On: 07/17/2020 15:29   09/16/2020 Venous Img Lower Bilateral (DVT)  Result Date: 07/18/2020 IMPRESSION: No evidence of DVT within either lower extremity. Electronically Signed   By: 09/17/2020 M.D.    On: 07/18/2020 14:41   09/17/2020 Venous Img Upper Bilat (DVT)  Result Date: 07/18/2020 IMPRESSION: 1. No evidence of DVT within either upper extremity. 2. Examination is positive for short-segment occlusive superficial thrombophlebitis involving mid humeral aspect of the cephalic vein. Electronically Signed   By: 09/17/2020 M.D.   On: 07/18/2020 14:40   DG Chest Port 1 View  Result Date: 07/18/2020 IMPRESSION: 1. Persistent bilateral ground-glass airspace disease consistent with pulmonary edema or atypical infection. No change since prior exam. Electronically Signed   By: 09/17/2020 M.D.   On: 07/18/2020 01:52    Cardiac Studies   2D echo 07/17/2020: 1. Left ventricular ejection fraction, by estimation, is 50 to 55%. The  left ventricle has low normal function. The left ventricle has no regional  wall motion abnormalities. Left ventricular diastolic parameters are  indeterminate.  2. Right ventricular systolic function is normal. The right ventricular  size is normal. Tricuspid regurgitation signal is inadequate for assessing  PA pressure.  3. Left atrial size was mildly dilated.  4. Right atrial size was mildly dilated.  5. The mitral valve is normal in structure. No evidence of mitral valve  regurgitation. No evidence of mitral stenosis.  6. The aortic valve is normal in structure. Aortic valve regurgitation is  mild. Mild aortic valve stenosis.  7. The inferior vena cava is dilated in size with <50% respiratory  variability, suggesting right atrial pressure of 15 mmHg.  Patient Profile     55 y.o. female with history of fibromyalgia, peripheral neuropathy, asthma, arthritis, ongoing tobacco use, and OSA who was admitted on 6/2 with acute respiratory failure secondary to AECOPD and acute on chronic HFpEF complicated by frontal lobe CVA, ARF, and severe symptomatic anemia who is being seen today for the evaluation of elevated troponin, elevated BNP and acute CVA at the request of Dr.  8/2.  Assessment & Plan    1. Acute frontal lobe CVA: -Multiple vascular territories involved concerning for cardiac or aortic embolic etiology -No evidence of A. fib thus far on telemetry, continue to monitor  -Will need outpatient  cardiac monitoring  -2D surface echo as above -Plan for TEE when medically stable, possibly next week pending respiratory and mental status -1 of 2 blood cultures growing staph epidermidis is likely a contaminant with repeat blood cultures pending at this time -Neurology following  -No antiplatelets at this time with anemia and thrombocytopenia, follow neuro recommendations   2. Elevated troponin: -Possibly in the setting of acute CVA, versus demand ischemia in the context of acute hypoxic respiratory failure secondary to likely COPD/asthma exacerbation and volume overload, ARF, and severe symptomatic anemia  -Cannot exclude ischemic etiology given multiple significant risk factors including ongoing significant tobacco abuse at 2 packs/day for at least 30 years, morbid obesity, HTN, and HLD -Given recent acute CVA and minimal troponin elevation and symptomatic anemia heparin gtt was deferred -Echo with preserved LVSF as above with normal wall motion -Will need an ischemic evaluation down the road when her severe acute illness is improved and comorbid conditions have been evaluated and adequately treated  3.  Acute on chronic HFpEF: -Recommend correction of hypoalbuminemia as this is contributing to third spacing and will be needed to augment diuresis, defer to primary service  -She will benefit from gentle IV diuresis, with preserved LVSF there is no indication for inotropic support  -Continue IV Lasix 40 mg daily and assess UOP  4.  Acute encephalopathy: -Uncertain if this is related to hypoxic brain injury with notation of O2 saturations in the 60s% at home versus acute CVA versus hyponatremia -Improving -Management per primary service  5.  Severe  symptomatic anemia: -Low, though stable -Status post PRBC this admission -Hematology following -Likely contributing to HFpEF  6.  ARF: -Possibly ATN in the setting of dehydration with decreased oral intake versus overdiuresis, though suspect the former given evidence of volume overload upon presentation -Improving  -Avoid nephrotoxic agents -Close monitoring   7.  Acute hypoxic respiratory failure: -Wean supplemental oxygen as tolerated -Patient has spent extended time periods relatively immobile laying in bed increasing the possibility for DVT/PE -D-dimer was elevated in the ED -Unable to perform CTA chest secondary to ARF -VQ cancelled due to recurrent hypoxia  -Imaging does not fully exclude atypical infection -Etiology likely multifactorial including volume overload, COPD/asthma exacerbation, HFpEF, morbid obesity with untreated OSA and likely OHS exacerbated by symptomatic severe anemia and third spacing with significant hypoalbuminemia -IV Lasix as above   For questions or updates, please contact [facility] Please consult [email or web address] for contact info under Cardiology/STEMI.    Signed, Eula Listen, PA-C [facility] Pager: [phone] 07/20/2020, 8:00 AM   Cardiology Attending  Patient seen and examined. She has had a nice diuresis. Her exam is unchanged from yesterday except possibly some reduced edema in the lower extremities. She has a RRR and clear lungs. She is hard of hearing and reads lips. Her weight is down a Kg. She appears to have had a nice recovery from her stroke. She will need TEE tomorrow. She could be transferred to a tele bed today.   Sharlot Gowda Gracey Tolle,MD

## 2020-07-20 NOTE — Progress Notes (Signed)
Inpatient Rehab Admissions Coordinator Note:   Per PT/OT recommendations, pt was screened for CIR candidacy by Wolfgang Phoenix, MS, CCC-SLP.  At this time we are not recommending an inpatient rehab consult. Note pt is on HFNC.  Will continue to follow from a distance pt's medical workup for appropriateness of CIR.  Please contact me with questions.    Wolfgang Phoenix, MS, CCC-SLP Admissions Coordinator [phone] 07/20/20 10:37 AM

## 2020-07-20 NOTE — Assessment & Plan Note (Addendum)
55 year old female patient with multiple medical problems is currently in the hospital for acute mental status changes/acute renal failure/anemia thrombocytopenia  #Thrombocytopenia-moderate [trended down from ~100 on admission]-noted to have peripheral destruction [elevated immature platelet fraction].  DRV VT neg ~130.  Currently 40 mg of prednisone; will taper to 20 mg/to be tapered/discontinued on outpatient basis.  #Anemia-hemoglobin around 8-9- ; acute on chronic-no evidence of hemolysis/schistocytes.  Iron studies inconclusive of iron deficiency.  Stable  #Acute mental status changes/stroke on MRI-improved.  Cryptogenic stroke- TEE negative for vegetations/source of stroke.  Noted to have aortic regurgitation.  #Discussed with patient's husband by the bedside.

## 2020-07-20 NOTE — Progress Notes (Signed)
Self removed cpap, states she cannot tolerate it, that her mouth is too dry with it on.

## 2020-07-20 NOTE — Progress Notes (Signed)
Physical Therapy Treatment Patient Details Name: Brandi Holland MRN: [id number] DOB: 04/30/1965 Today's Date: 07/20/2020    History of Present Illness Pt is a 55 y/o F with PMH: unremarkable due to noncompliance with medical care. Pt presented to ED with 2-week history of feeling unwell/acting confused. Pt was seen at an OSH for concern for fluid overload with CHF as differentials. 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 2D Echo: unlikely HF, but does show left and right atrial enlargement. Blood cultures + for staph epi. Pt adm for encephalopathy with CVAs and ICU d/t respiratory failure (currently on [facility]).    PT Comments    Pt is making great progress towards goals. Continues to be confused, however is alert and follows commands. Requires +2 for bed mobility and static standing trials this date. Very motivated although fearful of mobility due to history of falls. Good endurance with there-ex. Weaned down to 1L of O2 at end of session. Spouse present for session. Plan to progress to gait trial next session. Will continue to progress.  Follow Up Recommendations  CIR     Equipment Recommendations   (TBD)    Recommendations for Other Services       Precautions / Restrictions Precautions Precautions: Fall Restrictions Weight Bearing Restrictions: No    Mobility  Bed Mobility Overal bed mobility: Needs Assistance Bed Mobility: Supine to Sit     Supine to sit: Mod assist     General bed mobility comments: needs assist for B LE management and scooting towards EOB. Once seated, able to progress balance and require supervision    Transfers Overall transfer level: Needs assistance Equipment used: 2 person hand held assist Transfers: Sit to/from Stand Sit to Stand: Mod assist;+2 physical assistance         General transfer comment: able to perform 3 bouts of static standing for  grossly 2 mins each time. Pt fearful and prefers to use HHA vs RW. R knee blocked on first attempt. Attempted pre gait activities, however unable due to weakness. +2 required  Ambulation/Gait             General Gait Details: unable   Stairs             Wheelchair Mobility    Modified Rankin (Stroke Patients Only)       Balance Overall balance assessment: Needs assistance Sitting-balance support: Feet unsupported;Bilateral upper extremity supported Sitting balance-Leahy Scale: Good     Standing balance support: Bilateral upper extremity supported Standing balance-Leahy Scale: Fair Standing balance comment: unsteady                            Cognition Arousal/Alertness: Awake/alert Behavior During Therapy: WFL for tasks assessed/performed Overall Cognitive Status: Impaired/Different from baseline                                 General Comments: remains confused, however alert and follows commands. Very HOH      Exercises Other Exercises Other Exercises: supine ther-ex performed on B LE including AP, hip abd/add, heel slides, and seated LAQ. 10 reps. Trialed ther-ex on RA with sats decreasing to 87%. Reapplied 1L of O2 with sats at 91%. Other Exercises: Pt able to perform multiple bouts of standing while RN performed peri care and change of linen.  General Comments        Pertinent Vitals/Pain Pain Assessment: No/denies pain    Home Living                      Prior Function            PT Goals (current goals can now be found in the care plan section) Acute Rehab PT Goals Patient Stated Goal: to get stronger and back to being able to care for herself PT Goal Formulation: With patient Time For Goal Achievement: 08/02/20 Potential to Achieve Goals: Good Progress towards PT goals: Progressing toward goals    Frequency    7X/week      PT Plan Current plan remains appropriate    Co-evaluation               AM-PAC PT "6 Clicks" Mobility   Outcome Measure  Help needed turning from your back to your side while in a flat bed without using bedrails?: A Little Help needed moving from lying on your back to sitting on the side of a flat bed without using bedrails?: A Little Help needed moving to and from a bed to a chair (including a wheelchair)?: A Lot Help needed standing up from a chair using your arms (e.g., wheelchair or bedside chair)?: A Lot Help needed to walk in hospital room?: Total Help needed climbing 3-5 steps with a railing? : Total 6 Click Score: 12    End of Session Equipment Utilized During Treatment: Gait belt;Oxygen Activity Tolerance: Patient tolerated treatment well Patient left: in bed;with family/visitor present;with bed alarm set Nurse Communication: Mobility status PT Visit Diagnosis: Muscle weakness (generalized) (M62.81);Difficulty in walking, not elsewhere classified (R26.2);Apraxia (R48.2)     Time: 5400-8676 PT Time Calculation (min) (ACUTE ONLY): 26 min  Charges:  $Therapeutic Exercise: 8-22 mins $Therapeutic Activity: 8-22 mins                     Brandi Holland, PT, DPT [phone]    Brandi Holland 07/20/2020, 3:15 PM

## 2020-07-20 NOTE — Progress Notes (Signed)
PROGRESS NOTE    Brandi Holland  ZOX:[id number] DOB: [birth year] DOA: 07/16/2020 PCP: Pcp, No   Brief Narrative: Taken from H&P.  Brandi Holland is a 55 y.o. female with medical history significant of asthma, OSA, fibromyalgia who presents via EMS for shortness of breath.  She was hypoxic in 60s requiring 5 to 6 L of oxygen with intermittent nonrebreather. Initially admitting provider unable to obtained any history as there was no family member at bedside, no one could be reached on phone and patient was very altered.  Able to get hold of husband after multiple attempts at bedside, according to him patient was not feeling well for the past few weeks, she was admitted at [facility] on 06/18/2020 with lower extremity edema and was diuresed and discharged on 06/20/2020.  No echocardiogram done, CTA was negative for PE at that time, no significant abnormality on labs except sodium of 122 which improved to 130 and mild AKI, unknown baseline. Patient continued to feel bad and she was staying in bed for the past more than 2 weeks, unable to get out of bed, very poor p.o. intake.  Per husband she did had diarrhea with black color stool and he has seen some blood in stool last week, diarrhea lasted for 2 days and then resolved.  He did not noticed any hematemesis.  Patient does not drink alcohol but is a heavy smoker, smokes at least 2 packs/day.  Husband was able to brought her to ED 2 weeks ago and then she left after waiting couple of hours without being seen.  Per husband she was refusing to go see a doctor, when she continued to deteriorate he asked her son who lives in [place] who drove from there and talked with her and called the EMS. Husband denies any recent upper respiratory illnesses, fever or chills.  He did noticed worsening shortness of breath.  Patient was mostly bedbound for the past 2 weeks, he was using diapers to help her as she was unable to go to the bathroom by herself due to profound  weakness.  Patient remained very altered with intermittent desaturation, ABG with some respiratory alkalosis, no hypoxia.1/2 blood culture bottles with MSSE, MRI brain with multiple bilateral infarcts, worsening renal function so unable to obtain CTA, VQ scan ordered but got canceled due to recurrent desaturation.  D-dimer at 2.6, hemoglobin of 5.7, platelet counts of 97 this morning and there were 255 during recent hospitalization at [facility], BUN of 73 and creatinine of 2.99, sodium of 130, chloride of 96 and bicarb of 21.  Elevated T bili at 2.5 and INR of 1.5, lactic acidosis 3.2>>2.5>>1.9, elevated troponin 708>>904, BNP at 1893, CT chest and abdomen with bilateral groundglass opacities which can be due to pulmonary edema versus atypical pneumonia and no significant abnormality on CT abdomen.  Echocardiogram with EF of 50 to 55%, which is within lower normal limit, no wall motion abnormalities and indeterminate diastolic parameters, mildly biatrial dilatation and dilated inferior vena cava with less than 50% variability. Received total of 3 units PRBC 6/3:Had swallow evaluation today and they are recommending dysphagia 2 diet. 6/4: Cardiology started her on diuresis with IV Lasix 40 mg daily, some improvement in breathing status. 6/5: Significant improvement in breathing status, worsening thrombocytopenia.  Subjective: Patient was seen and examined today.  No new complaint.  Stating that she is feeling much better.  Husband at bedside.  No obvious bleeding.  Assessment & Plan:   Principal Problem:   Acute  encephalopathy Active Problems:   CVA (cerebral vascular accident) (HCC)   Asthma   Acute respiratory failure with hypoxia (HCC)   AKI (acute kidney injury) (HCC)   Hyponatremia   OSA (obstructive sleep apnea)   AMS (altered mental status)   Acute on chronic diastolic heart failure (HCC)   Folate deficiency   Hyperbilirubinemia  Severe acute encephalopathy.  Improving  Unknown etiology  and can be multifactorial with bilateral strokes. No obvious source of infection except 1 out of 2 blood culture with MSSE which is most likely a contaminant, procalcitonin at 0.35>>0.27, concern of cardiac embolic source which can be due to endocarditis or a thrombus secondary to heart failure?? -Continue to monitor  Acute hypoxic respiratory failure.  Improving, currently on 2 L of oxygen  Patient has an history of heavy smoking but no underlying diagnosis of COPD, not on home oxygen.  Initially required nonrebreather and then later switched to heated high flow. Might be due to encephalopathy versus pulmonary edema.  Elevated D-dimer but unable to obtain CTA due to AKI, VQ scan got canceled due to recurrent desaturation. Cardiology started her on IV Lasix Continue with supplemental oxygen-wean as tolerated.  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  Worsening thrombocytopenia with some improvement in hemoglobin at 8.5 with platelet of 56 today. 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  Might have GI bleed as husband was mentioning about dark color stools while she was having diarrhea for 2 days last week.  No recurrence at this time. Anemia panel with iron and folate deficiency.  Patient received 1 unit while in the ED, and 2 L next day. Received IV iron X 2 doses.  Worsening thrombocytopenia, platelet at 56 today, no obvious bleeding.  Hematology is involved-appreciate their help.  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  Rest of the labs pending. -Hematology started her on prednisone 60 mg daily. -Monitor CBC -Check FOBT-still pending -Can involve GI-hold at this time as she is not actively bleeding and she might have mucosal bleed with thrombocytopenia.  CT abdomen with no concern of cirrhosis.  HFpEF/Elevated troponin.  No prior history of heart  failure. Echocardiogram with low normal EF and indeterminant diastolic dysfunction, most likely diastolic heart failure.  Lung sounds quite wet. Cardiology is on board. -Continue Lasix 40 mg IV daily. -Daily BMP and weight -Strict intake and output  Hypoalbuminemia.  Causing some third spacing. -Give her albumin IV to help with diuresis and third spacing.  Hypophosphatemia. -Replace phosphorous and monitor  Bilateral multifocal cerebral infarct.  Most likely secondary to cardiac emboli, TTE without any obvious source, will need TEE and rule out PFO.  Extremity Doppler scans were negative for any DVT, 1 small acute superficial thrombophlebitis in upper extremity. MRA without any significant stenosis. Neurology is on board and patient is being worked up for stroke. No anticoagulation at this time due to severe anemia and thrombocytopenia.  1/2 blood cultures with MSSE.  Most likely a contaminant.  Patient is afebrile with no leukocytosis, borderline procalcitonin.  Cannot ignore it due to her acute illness and concern of endocarditis. Patient received 1 dose of cefepime and 2 doses of vancomycin earlier during admission. -Repeat blood culture -negative. -ID consult-started her on cefazolin based on her risk factor, most likely will stop tomorrow after 5 days. -TTE without any significant abnormality -Possible TEE to rule out cardiac source of emboli-most likely be done next week once more stable.  Hyponatremia.  Resolved Sodium was 122 during recent admission at [facility] earlier in May, improved to 135 today.  Most likely secondary to poor p.o. intake and volume overload. -Patient is on maintenance IV fluid. -Monitor sodium  AKI.  Improving  Unknown baseline, continue to improve, at 1.14 today.  It was 1.2 during her recent admission at [facility].  Renal ultrasound without any significant abnormality.  Foley was placed for strict intake and output. -Monitor renal function -Avoid  nephrotoxins -Discontinue Foley today and give her a voiding trial as she is more alert now.  Stage III obesity. Estimated body mass index is 43.08 kg/m as calculated from the following:   Height as of this encounter: 4\' 9"  (1.448 m).   Weight as of this encounter: 90.3 kg.  This will complicate overall prognosis.  Objective: Vitals:   07/20/20 0400 07/20/20 0500 07/20/20 0600 07/20/20 0700  BP: 107/67 121/66 102/65 122/63  Pulse:   88 88  Resp: (!) 27 (!) 22 17 (!) 23  Temp:      TempSrc:      SpO2:   96% 98%  Weight:      Height:        Intake/Output Summary (Last 24 hours) at 07/20/2020 0750 Last data filed at 07/20/2020 0700 Gross per 24 hour  Intake 520 ml  Output 2275 ml  Net -1755 ml   Filed Weights   07/16/20 2011 07/18/20 0500 07/19/20 0500  Weight: 105 kg 91.7 kg 90.3 kg    Examination:  General.  Morbidly obese lady, in no acute distress. Pulmonary.  Few basal crackles, normal respiratory effort. CV.  Regular rate and rhythm, no JVD, rub or murmur. Abdomen.  Soft, nontender, nondistended, BS positive. CNS.  Alert and oriented .  No focal neurologic deficit. Extremities.  No edema, no cyanosis, pulses intact and symmetrical. Psychiatry.  Judgment and insight appears normal.  DVT prophylaxis: SCDs, patient has severe anemia and thrombocytopenia. Code Status: Full Family Communication: Discussed with husband at bedside. Disposition Plan:  Status is: Inpatient  Remains inpatient appropriate because:Inpatient level of care appropriate due to severity of illness   Dispo: The patient is from: Home              Anticipated d/c is to: To be determined              Patient currently is not medically stable to d/c.   Difficult to place patient No               Level of care: Stepdown  All the records are reviewed and case discussed with Care Management/Social Worker. Management plans discussed with the patient, nursing and they are in  agreement.  Consultants:   Neurology  Cardiology  ID  Hematology  Procedures:  Antimicrobials:  Cefazolin  Data Reviewed: I have personally reviewed following labs and imaging studies  CBC: Recent Labs  Lab 07/17/20 1227 07/18/20 0115 07/18/20 0555 07/18/20 1216 07/19/20 0345 07/20/20 0420  WBC 6.3 7.6 7.1 6.7 5.4 5.3  NEUTROABS 4.5 5.7  --   --  3.6 3.1  HGB 6.5*  6.6* 8.1* 8.0* 8.3* 8.0* 8.5*  HCT 20.6*  20.9* 24.9* 25.7* 25.9* 25.0* 27.5*  MCV 96.7 94.7 95.9 94.9 95.1 99.3  PLT 80* 79* 77* 73* 61* 56*   Basic Metabolic Panel: Recent Labs  Lab 07/16/20 2016 07/17/20 0534 07/18/20 0115 07/19/20 0345 07/20/20 0420  NA 130* 130* 134* 135 136  K 4.3 4.5 3.7 3.0* 4.0  CL 93* 96* 100 103 102  CO2 22 21* 21* 24 27  GLUCOSE 123* 148* 120* 100* 122*  BUN 70* 73* 72* 44* 40*  CREATININE 3.15* 2.99* 2.41* 1.38* 1.14*  CALCIUM 8.8* 8.6* 8.9 8.3* 8.5*  MG  --   --   --  2.2  --   PHOS  --   --  5.1* 2.5 1.8*   GFR: Estimated Creatinine Clearance: 51.6 mL/min (A) (by C-G formula based on SCr of 1.14 mg/dL (H)). Liver Function Tests: Recent Labs  Lab 07/16/20 2016 07/17/20 0534 07/18/20 0115 07/18/20 1216 07/19/20 0345 07/20/20 0420  AST 30 25  --   --   --   --   ALT 12 11  --   --   --   --   ALKPHOS 64 57  --   --   --   --   BILITOT 2.6* 2.5*  --  2.6*  --   --   PROT 7.0 6.7  --   --   --   --   ALBUMIN 2.8* 2.7* 2.9*  --  2.3* 2.5*   Recent Labs  Lab 07/16/20 2016  LIPASE 32   Recent Labs  Lab 07/18/20 0837  AMMONIA 17   Coagulation Profile: Recent Labs  Lab 07/17/20 0018 07/17/20 0534  INR 1.5* 1.5*   Cardiac Enzymes: Recent Labs  Lab 07/16/20 2016  CKTOTAL 266*   BNP (last 3 results) No results for input(s): PROBNP in the last 8760 hours. HbA1C: Recent Labs    07/17/20 0901  HGBA1C 5.0   CBG: Recent Labs  Lab 07/18/20 0022  GLUCAP 112*   Lipid Profile: Recent Labs    07/17/20 1237  LDLDIRECT 46.4   Thyroid  Function Tests: Recent Labs    07/17/20 1600  TSH 3.567   Anemia Panel: Recent Labs    07/17/20 1432  RETICCTPCT 6.2*   Sepsis Labs: Recent Labs  Lab 07/16/20 2016 07/17/20 0534 07/17/20 0901 07/17/20 1227 07/18/20 0115  PROCALCITON 0.35 0.34  --   --  0.27  LATICACIDVEN 3.2*  --  2.5* 1.9  --     Recent Results (from the past 240 hour(s))  Blood culture (single)     Status: Abnormal   Collection Time: 07/16/20  8:07 PM   Specimen: BLOOD  Result Value Ref Range Status   Specimen Description   Final    BLOOD LEFT ANTECUBITAL Performed at [facility], [address], [place], [place] [address]    Special Requests   Final    BOTTLES DRAWN AEROBIC AND ANAEROBIC Blood Culture results may not be optimal due to an inadequate volume of blood received in culture bottles Performed at [facility], [address] [place], [place] [address]    Culture  Setup Time   Final    GRAM POSITIVE COCCI IN BOTH AEROBIC AND ANAEROBIC BOTTLES CRITICAL RESULT CALLED TO, READ BACK BY AND VERIFIED WITH: DEVIN MITCHELL 1200 07/17/2020 DLB    Culture (A)  Final    STAPHYLOCOCCUS EPIDERMIDIS STAPHYLOCOCCUS CAPITIS THE SIGNIFICANCE OF ISOLATING THIS ORGANISM FROM A SINGLE SET OF BLOOD CULTURES WHEN MULTIPLE SETS ARE DRAWN IS UNCERTAIN. PLEASE NOTIFY THE MICROBIOLOGY DEPARTMENT WITHIN ONE WEEK IF SPECIATION AND SENSITIVITIES ARE REQUIRED. Performed at [facility], [address], [place], [place] [address]    Report Status 07/19/2020 FINAL  Final  Blood Culture ID Panel (Reflexed)     Status: Abnormal   Collection Time:  07/16/20  8:07 PM  Result Value Ref Range Status   Enterococcus faecalis NOT DETECTED NOT DETECTED Final   Enterococcus Faecium NOT DETECTED NOT DETECTED Final   Listeria monocytogenes NOT DETECTED NOT DETECTED Final   Staphylococcus species DETECTED (A) NOT DETECTED Final    Comment: CRITICAL RESULT CALLED TO, READ BACK BY AND VERIFIED WITH: DEVIN  MITCHELL 1200 07/17/2020 DLB    Staphylococcus aureus (BCID) NOT DETECTED NOT DETECTED Final   Staphylococcus epidermidis DETECTED (A) NOT DETECTED Final    Comment: CRITICAL RESULT CALLED TO, READ BACK BY AND VERIFIED WITH: DEVIN MITCHELL 1200 07/17/2020 DLB    Staphylococcus lugdunensis NOT DETECTED NOT DETECTED Final   Streptococcus species NOT DETECTED NOT DETECTED Final   Streptococcus agalactiae NOT DETECTED NOT DETECTED Final   Streptococcus pneumoniae NOT DETECTED NOT DETECTED Final   Streptococcus pyogenes NOT DETECTED NOT DETECTED Final   A.calcoaceticus-baumannii NOT DETECTED NOT DETECTED Final   Bacteroides fragilis NOT DETECTED NOT DETECTED Final   Enterobacterales NOT DETECTED NOT DETECTED Final   Enterobacter cloacae complex NOT DETECTED NOT DETECTED Final   Escherichia coli NOT DETECTED NOT DETECTED Final   Klebsiella aerogenes NOT DETECTED NOT DETECTED Final   Klebsiella oxytoca NOT DETECTED NOT DETECTED Final   Klebsiella pneumoniae NOT DETECTED NOT DETECTED Final   Proteus species NOT DETECTED NOT DETECTED Final   Salmonella species NOT DETECTED NOT DETECTED Final   Serratia marcescens NOT DETECTED NOT DETECTED Final   Haemophilus influenzae NOT DETECTED NOT DETECTED Final   Neisseria meningitidis NOT DETECTED NOT DETECTED Final   Pseudomonas aeruginosa NOT DETECTED NOT DETECTED Final   Stenotrophomonas maltophilia NOT DETECTED NOT DETECTED Final   Candida albicans NOT DETECTED NOT DETECTED Final   Candida auris NOT DETECTED NOT DETECTED Final   Candida glabrata NOT DETECTED NOT DETECTED Final   Candida krusei NOT DETECTED NOT DETECTED Final   Candida parapsilosis NOT DETECTED NOT DETECTED Final   Candida tropicalis NOT DETECTED NOT DETECTED Final   Cryptococcus neoformans/gattii NOT DETECTED NOT DETECTED Final   Methicillin resistance mecA/C NOT DETECTED NOT DETECTED Final    Comment: Performed at [facility], [address] [place], [place] [address]   Resp Panel by RT-PCR (Flu A&B, Covid) Nasopharyngeal Swab     Status: None   Collection Time: 07/16/20  8:16 PM   Specimen: Nasopharyngeal Swab; Nasopharyngeal(NP) swabs in vial transport medium  Result Value Ref Range Status   SARS Coronavirus 2 by RT PCR NEGATIVE NEGATIVE Final    Comment: (NOTE) SARS-CoV-2 target nucleic acids are NOT DETECTED.  The SARS-CoV-2 RNA is generally detectable in upper respiratory specimens during the acute phase of infection. The lowest concentration of SARS-CoV-2 viral copies this assay can detect is 138 copies/mL. A negative result does not preclude SARS-Cov-2 infection and should not be used as the sole basis for treatment or other patient management decisions. A negative result may occur with  improper specimen collection/handling, submission of specimen other than nasopharyngeal swab, presence of viral mutation(s) within the areas targeted by this assay, and inadequate number of viral copies(<138 copies/mL). A negative result must be combined with clinical observations, patient history, and epidemiological information. The expected result is Negative.  Fact Sheet for Patients:  BloggerCourse.com  Fact Sheet for Healthcare Providers:  SeriousBroker.it  This test is no t yet approved or cleared by the [place] FDA and  has been authorized for detection and/or diagnosis of SARS-CoV-2 by FDA under an Emergency Use Authorization (EUA). This EUA will  remain  in effect (meaning this test can be used) for the duration of the COVID-19 declaration under Section 564(b)(1) of the Act, 21 U.S.C.section 360bbb-3(b)(1), unless the authorization is terminated  or revoked sooner.       Influenza A by PCR NEGATIVE NEGATIVE Final   Influenza B by PCR NEGATIVE NEGATIVE Final    Comment: (NOTE) The Xpert Xpress SARS-CoV-2/FLU/RSV plus assay is intended as an aid in the diagnosis of influenza from  Nasopharyngeal swab specimens and should not be used as a sole basis for treatment. Nasal washings and aspirates are unacceptable for Xpert Xpress SARS-CoV-2/FLU/RSV testing.  Fact Sheet for Patients: BloggerCourse.com  Fact Sheet for Healthcare Providers: SeriousBroker.it  This test is not yet approved or cleared by the [place] FDA and has been authorized for detection and/or diagnosis of SARS-CoV-2 by FDA under an Emergency Use Authorization (EUA). This EUA will remain in effect (meaning this test can be used) for the duration of the COVID-19 declaration under Section 564(b)(1) of the Act, 21 U.S.C. section 360bbb-3(b)(1), unless the authorization is terminated or revoked.  Performed at [facility], [address] [place], [place] [address]   MRSA PCR Screening     Status: None   Collection Time: 07/18/20 12:38 AM   Specimen: Nasal Mucosa; Nasopharyngeal  Result Value Ref Range Status   MRSA by PCR NEGATIVE NEGATIVE Final    Comment:        The GeneXpert MRSA Assay (FDA approved for NASAL specimens only), is one component of a comprehensive MRSA colonization surveillance program. It is not intended to diagnose MRSA infection nor to guide or monitor treatment for MRSA infections. Performed at [facility], [address] [place], [place] [address]   CULTURE, BLOOD (ROUTINE X 2) w Reflex to ID Panel     Status: None (Preliminary result)   Collection Time: 07/18/20  3:13 PM   Specimen: BLOOD  Result Value Ref Range Status   Specimen Description BLOOD BLOOD RIGHT FOREARM  Final   Special Requests   Final    BOTTLES DRAWN AEROBIC AND ANAEROBIC Blood Culture adequate volume   Culture   Final    NO GROWTH 2 DAYS Performed at [facility], [address] [place], [place] [address]    Report Status PENDING  Incomplete  CULTURE, BLOOD (ROUTINE X 2) w Reflex to ID Panel     Status: None  (Preliminary result)   Collection Time: 07/18/20  3:17 PM   Specimen: BLOOD  Result Value Ref Range Status   Specimen Description BLOOD BLOOD RIGHT HAND  Final   Special Requests   Final    BOTTLES DRAWN AEROBIC AND ANAEROBIC Blood Culture adequate volume   Culture   Final    NO GROWTH 2 DAYS Performed at [facility], [address] [place], [place] [address]    Report Status PENDING  Incomplete     Radiology Studies: US Venous Img Lower Bilateral (DVT)  Result Date: 07/18/2020 CLINICAL DATA:  Bilateral lower extremity swelling for the past 3 weeks. Evaluate for DVT. EXAM: BILATERAL LOWER EXTREMITY VENOUS DOPPLER ULTRASOUND TECHNIQUE: Gray-scale sonography with graded compression, as well as color Doppler and duplex ultrasound were performed to evaluate the lower extremity deep venous systems from the level of the common femoral vein and including the common femoral, femoral, profunda femoral, popliteal and calf veins including the posterior tibial, peroneal and gastrocnemius veins when visible. The superficial great saphenous vein was also interrogated. Spectral Doppler was  utilized to evaluate flow at rest and with distal augmentation maneuvers in the common femoral, femoral and popliteal veins. COMPARISON:  None. FINDINGS: Examination is degraded due to patient body habitus and poor sonographic window. RIGHT LOWER EXTREMITY Common Femoral Vein: No evidence of thrombus. Normal compressibility, respiratory phasicity and response to augmentation. Saphenofemoral Junction: No evidence of thrombus. Normal compressibility and flow on color Doppler imaging. Profunda Femoral Vein: No evidence of thrombus. Normal compressibility and flow on color Doppler imaging. Femoral Vein: No evidence of thrombus. Normal compressibility, respiratory phasicity and response to augmentation. Popliteal Vein: No evidence of thrombus. Normal compressibility, respiratory phasicity and response to augmentation. Calf  Veins: No evidence of thrombus. Normal compressibility and flow on color Doppler imaging. Superficial Great Saphenous Vein: No evidence of thrombus. Normal compressibility. Venous Reflux:  None. Other Findings:  None. LEFT LOWER EXTREMITY Common Femoral Vein: No evidence of thrombus. Normal compressibility, respiratory phasicity and response to augmentation. Saphenofemoral Junction: No evidence of thrombus. Normal compressibility and flow on color Doppler imaging. Profunda Femoral Vein: No evidence of thrombus. Normal compressibility and flow on color Doppler imaging. Femoral Vein: No evidence of thrombus. Normal compressibility, respiratory phasicity and response to augmentation. Popliteal Vein: No evidence of thrombus. Normal compressibility, respiratory phasicity and response to augmentation. Calf Veins: No evidence of thrombus. Normal compressibility and flow on color Doppler imaging. Superficial Great Saphenous Vein: No evidence of thrombus. Normal compressibility. Venous Reflux:  None. Other Findings:  None. IMPRESSION: No evidence of DVT within either lower extremity. Electronically Signed   By: Simonne Come M.D.   On: 07/18/2020 14:41   US Venous Img Upper Bilat (DVT)  Result Date: 07/18/2020 CLINICAL DATA:  Bilateral upper extremity pain and edema for the past several weeks. Evaluate for DVT. EXAM: BILATERAL UPPER EXTREMITY VENOUS DOPPLER ULTRASOUND TECHNIQUE: Gray-scale sonography with graded compression, as well as color Doppler and duplex ultrasound were performed to evaluate the bilateral upper extremity deep venous systems from the level of the subclavian vein and including the jugular, axillary, basilic, radial, ulnar and upper cephalic vein. Spectral Doppler was utilized to evaluate flow at rest and with distal augmentation maneuvers. COMPARISON:  None. FINDINGS: RIGHT UPPER EXTREMITY Internal Jugular Vein: No evidence of thrombus. Normal compressibility, respiratory phasicity and response to  augmentation. Subclavian Vein: No evidence of thrombus. Normal compressibility, respiratory phasicity and response to augmentation. Axillary Vein: No evidence of thrombus. Normal compressibility, respiratory phasicity and response to augmentation. Cephalic Vein: No evidence of thrombus. Normal compressibility, respiratory phasicity and response to augmentation. Basilic Vein: No evidence of thrombus. Normal compressibility, respiratory phasicity and response to augmentation. Brachial Veins: No evidence of thrombus. Normal compressibility, respiratory phasicity and response to augmentation. Radial Veins: No evidence of thrombus. Normal compressibility, respiratory phasicity and response to augmentation. Ulnar Veins: No evidence of thrombus. Normal compressibility, respiratory phasicity and response to augmentation. Venous Reflux:  None. Other Findings:  None. LEFT UPPER EXTREMITY Internal Jugular Vein: No evidence of thrombus. Normal compressibility, respiratory phasicity and response to augmentation. Subclavian Vein: No evidence of thrombus. Normal compressibility, respiratory phasicity and response to augmentation. Axillary Vein: No evidence of thrombus. Normal compressibility, respiratory phasicity and response to augmentation. Cephalic Vein: While the proximal aspect of the cephalic vein appears widely patent (image 9), there is hypoechoic occlusive thrombus involving the mid aspect of the cephalic vein at the level of the mid humerus (images 10 and 12). Basilic Vein: No evidence of thrombus. Normal compressibility, respiratory phasicity and response to augmentation. Brachial Veins: No evidence of  thrombus. Normal compressibility, respiratory phasicity and response to augmentation. Radial Veins: No evidence of thrombus. Normal compressibility, respiratory phasicity and response to augmentation. Ulnar Veins: No evidence of thrombus. Normal compressibility, respiratory phasicity and response to augmentation. Venous  Reflux:  None. Other Findings:  None. IMPRESSION: 1. No evidence of DVT within either upper extremity. 2. Examination is positive for short-segment occlusive superficial thrombophlebitis involving mid humeral aspect of the cephalic vein. Electronically Signed   By: Simonne Come M.D.   On: 07/18/2020 14:40    Scheduled Meds: .  stroke: mapping our early stages of recovery book   Does not apply Once  . budesonide (PULMICORT) nebulizer solution  0.5 mg Nebulization BID  . Chlorhexidine Gluconate Cloth  6 each Topical Q0600  . feeding supplement  237 mL Oral BID BM  . folic acid  1 mg Oral Daily  . ipratropium-albuterol  3 mL Nebulization Q6H  . mouth rinse  15 mL Mouth Rinse BID  . nicotine  21 mg Transdermal Daily  . sodium chloride flush  3 mL Intravenous Q12H   Continuous Infusions: . albumin human 25 g (07/19/20 1532)  .  ceFAZolin (ANCEF) IV Stopped (07/20/20 0546)  . sodium phosphate  Dextrose 5% IVPB       LOS: 3 days   Time spent: 35 minutes. More than 50% of the time was spent in counseling/coordination of care. Patient is critically sick with multiorgan dysfunction and high risk for deterioration and death.  Arnetha Courser, MD [facility]  If 7PM-7AM, please contact night-coverage [email or web address]  07/20/2020, 7:50 AM   This record has been created using Conservation officer, historic buildings. Errors have been sought and corrected,but may not always be located. Such creation errors do not reflect on the standard of care.

## 2020-07-20 NOTE — Progress Notes (Signed)
Brandi Holland   DOB:October 26, 1965   VX#:[id number]    Subjective: No acute events overnight.  She is resting comfortably; on 2 L of oxygen.  Accompanied by husband.  Objective:  Vitals:   07/21/20 2000 07/21/20 2132  BP: 110/62   Pulse: 97 99  Resp: 18 20  Temp: 98.8 F (37.1 C)   SpO2: 96% 94%     Intake/Output Summary (Last 24 hours) at 07/21/2020 2135 Last data filed at 07/21/2020 1550 Gross per 24 hour  Intake 199.87 ml  Output 875 ml  Net -675.13 ml    Physical Exam Constitutional:      Comments: Obese Caucasian female patient.  Resting in the bed.  No acute distress.  HENT:     Head: Normocephalic and atraumatic.     Mouth/Throat:     Pharynx: No oropharyngeal exudate.  Eyes:     Pupils: Pupils are equal, round, and reactive to light.  Cardiovascular:     Rate and Rhythm: Normal rate and regular rhythm.  Pulmonary:     Effort: No respiratory distress.     Breath sounds: No wheezing.     Comments: Decreased air entry bilaterally.  Positive for basilar crackles. Abdominal:     General: Bowel sounds are normal. There is no distension.     Palpations: Abdomen is soft. There is no mass.     Tenderness: There is no abdominal tenderness. There is no guarding or rebound.  Musculoskeletal:        General: No tenderness. Normal range of motion.     Cervical back: Normal range of motion and neck supple.  Skin:    General: Skin is warm.  Neurological:     Mental Status: She is alert and oriented to person, place, and time.  Psychiatric:        Mood and Affect: Affect normal.     Comments: Flat affect.      Labs:  Lab Results  Component Value Date   WBC 5.4 07/21/2020   HGB 9.0 (L) 07/21/2020   HCT 28.4 (L) 07/21/2020   MCV 98.6 07/21/2020   PLT 67 (L) 07/21/2020   NEUTROABS 3.1 07/20/2020    Lab Results  Component Value Date   NA 135 07/21/2020   K 3.8 07/21/2020   CL 99 07/21/2020   CO2 27 07/21/2020    Studies:  No results found.  Thrombocytopenia  (HCC) 55 year old female patient with multiple medical problems is currently in the hospital for acute mental status changes/acute renal failure/anemia thrombocytopenia  #Thrombocytopenia-moderate [trended down from ~100 on admission]-noted to have peripheral destruction [elevated immature platelet fraction].  On prednisone 60 mg a day [started on 6/05].  Platelets improved from 56 to 67.  Continue prednisone at the current dose.  Awaiting further work-up including antiphospholipid antibody testing.Marland Kitchen   #Anemia-hemoglobin around 9- ; acute on chronic-no evidence of hemolysis/schistocytes.  Iron studies inconclusive of iron deficiency.  Stable.  #Acute mental status changes/stroke on MRI-improved.  Work-up/TEE pending.   Earna Coder, MD 07/21/2020  9:35 PM

## 2020-07-21 DIAGNOSIS — Z8673 Personal history of transient ischemic attack (TIA), and cerebral infarction without residual deficits: Secondary | ICD-10-CM

## 2020-07-21 DIAGNOSIS — G9340 Encephalopathy, unspecified: Secondary | ICD-10-CM | POA: Diagnosis not present

## 2020-07-21 DIAGNOSIS — I5033 Acute on chronic diastolic (congestive) heart failure: Secondary | ICD-10-CM

## 2020-07-21 DIAGNOSIS — J45909 Unspecified asthma, uncomplicated: Secondary | ICD-10-CM

## 2020-07-21 DIAGNOSIS — G4733 Obstructive sleep apnea (adult) (pediatric): Secondary | ICD-10-CM

## 2020-07-21 DIAGNOSIS — E871 Hypo-osmolality and hyponatremia: Secondary | ICD-10-CM

## 2020-07-21 DIAGNOSIS — R4182 Altered mental status, unspecified: Secondary | ICD-10-CM

## 2020-07-21 LAB — LUPUS ANTICOAGULANT PANEL
DRVVT: 49.4 s — ABNORMAL HIGH (ref 0.0–47.0)
PTT Lupus Anticoagulant: 46.3 s (ref 0.0–51.9)

## 2020-07-21 LAB — LEGIONELLA PNEUMOPHILA SEROGP 1 UR AG: L. pneumophila Serogp 1 Ur Ag: NEGATIVE

## 2020-07-21 LAB — DRVVT CONFIRM: dRVVT Confirm: 1 ratio (ref 0.8–1.2)

## 2020-07-21 LAB — BASIC METABOLIC PANEL
Anion gap: 9 (ref 5–15)
BUN: 38 mg/dL — ABNORMAL HIGH (ref 6–20)
CO2: 27 mmol/L (ref 22–32)
Calcium: 8.9 mg/dL (ref 8.9–10.3)
Chloride: 99 mmol/L (ref 98–111)
Creatinine, Ser: 1.03 mg/dL — ABNORMAL HIGH (ref 0.44–1.00)
GFR, Estimated: >60 mL/min (ref >60)
Glucose, Bld: 174 mg/dL — ABNORMAL HIGH (ref 70–99)
Potassium: 3.8 mmol/L (ref 3.5–5.1)
Sodium: 135 mmol/L (ref 135–145)

## 2020-07-21 LAB — CBC
HCT: 28.4 % — ABNORMAL LOW (ref 36.0–46.0)
Hemoglobin: 9 g/dL — ABNORMAL LOW (ref 12.0–15.0)
MCH: 31.3 pg (ref 26.0–34.0)
MCHC: 31.7 g/dL (ref 30.0–36.0)
MCV: 98.6 fL (ref 80.0–100.0)
Platelets: 67 10*3/uL — ABNORMAL LOW (ref 150–400)
RBC: 2.88 MIL/uL — ABNORMAL LOW (ref 3.87–5.11)
RDW: 21.9 % — ABNORMAL HIGH (ref 11.5–15.5)
WBC: 5.4 10*3/uL (ref 4.0–10.5)
nRBC: 0 % (ref 0.0–0.2)

## 2020-07-21 LAB — ANA COMPREHENSIVE PANEL
Anti JO-1: <0.2 AI (ref 0.0–0.9)
Centromere Ab Screen: <0.2 AI (ref 0.0–0.9)
Chromatin Ab SerPl-aCnc: <0.2 AI (ref 0.0–0.9)
ENA SM Ab Ser-aCnc: <0.2 AI (ref 0.0–0.9)
Ribonucleic Protein: <0.2 AI (ref 0.0–0.9)
SSA (Ro) (ENA) Antibody, IgG: 0.2 AI (ref 0.0–0.9)
SSB (La) (ENA) Antibody, IgG: 0.2 AI (ref 0.0–0.9)
Scleroderma (Scl-70) (ENA) Antibody, IgG: <0.2 AI (ref 0.0–0.9)
ds DNA Ab: <1 IU/mL (ref 0–9)

## 2020-07-21 LAB — COLD AGGLUTININ TITER: Cold Agglutinin Titer: NEGATIVE

## 2020-07-21 LAB — HEMOGLOBIN A1C
Hgb A1c MFr Bld: 5.3 % (ref 4.8–5.6)
Mean Plasma Glucose: 105 mg/dL

## 2020-07-21 LAB — DRVVT MIX: dRVVT Mix: 42.7 s — ABNORMAL HIGH (ref 0.0–40.4)

## 2020-07-21 MED ORDER — SODIUM CHLORIDE 0.9 % IV SOLN: INTRAVENOUS | Status: DC

## 2020-07-21 NOTE — Progress Notes (Addendum)
Progress Note  Patient Name: Brandi Holland Date of Encounter: 07/21/2020  Primary Cardiologist: Dr. Kirke Corin  Subjective   Alert and oriented. Very hard of hearing.   ROS limited by hearing. Able to read lips; however, per nursing staff, no clear masks available.   Consented pt for TEE with husband, present at time of exam.  Inpatient Medications    Scheduled Meds: .  stroke: mapping our early stages of recovery book   Does not apply Once  . budesonide (PULMICORT) nebulizer solution  0.5 mg Nebulization BID  . Chlorhexidine Gluconate Cloth  6 each Topical Q0600  . feeding supplement  237 mL Oral BID BM  . folic acid  1 mg Oral Daily  . furosemide  40 mg Intravenous Daily  . ipratropium-albuterol  3 mL Nebulization Q6H  . mouth rinse  15 mL Mouth Rinse BID  . nicotine  21 mg Transdermal Daily  . predniSONE  60 mg Oral Q breakfast  . sodium chloride flush  3 mL Intravenous Q12H   Continuous Infusions: . albumin human 25 g (07/21/20 1052)   PRN Meds: acetaminophen **OR** acetaminophen, bisacodyl, ipratropium-albuterol, LORazepam   Vital Signs    Vitals:   07/21/20 1200 07/21/20 1300 07/21/20 1400 07/21/20 1406  BP: (!) 109/59 118/60 (!) 129/99   Pulse: (!) 114 (!) 104 96 96  Resp: 20 19 17  (!) 22  Temp: 98.5 F (36.9 C)     TempSrc: Axillary     SpO2: 91% 93% 96% 97%  Weight:      Height:        Intake/Output Summary (Last 24 hours) at 07/21/2020 1501 Last data filed at 07/21/2020 0600 Gross per 24 hour  Intake 659.87 ml  Output 725 ml  Net -65.13 ml   Last 3 Weights 07/21/2020 07/19/2020 07/18/2020  Weight (lbs) 202 lb 13.2 oz 199 lb 1.2 oz 202 lb 2.6 oz  Weight (kg) 92 kg 90.3 kg 91.7 kg      Telemetry    NSR, 90s-100s with transient elevation to 128 - Personally Reviewed  ECG    No new tracings - Personally Reviewed  Physical Exam   Vital Signs. BP (!) 109/58   Pulse (!) 105   Temp 99 F (37.2 C) (Axillary)   Resp (!) 25   Ht 4\' 9"  (1.448 m)   Wt  92 kg   SpO2 99%   BMI 43.89 kg/m  General: Obese female, no acute distress, joined by her husband.  Hard of hearing Head: Normocephalic, atraumatic, sclera non-icteric, no xanthomas, nares are without discharge.  Hard of hearing. Neck: No JVD Lungs: Rhonchi, cleared with cough, otherwise clear bilaterally to auscultation though reduced bilaterally and with some work of breathing noted  Heart: RRR S1 S2 without murmurs, rubs, or gallops. With auscultation of lungs, rate elevated to tachycardic Abdomen: Soft, non-tender, non-distended with normoactive bowel sounds. No rebound/guarding. Extremities: No clubbing or cyanosis. No edema. Distal pedal pulses are 2+ and equal bilaterally. Neuro: Alert and oriented X 3. Moves all extremities spontaneously.  Hearing difficulty Psych:  Responds to questions appropriately with a normal affect when able to hear  Labs    High Sensitivity Troponin:   Recent Labs  Lab 07/16/20 2016 07/17/20 0534 07/17/20 0901 07/17/20 1227  TROPONINIHS 708* 869* 846* 904*      Chemistry Recent Labs  Lab 07/16/20 2016 07/17/20 0534 07/18/20 0115 07/18/20 1216 07/19/20 0345 07/20/20 0420 07/21/20 0411  NA 130* 130* 134*  --  135 136 135  K 4.3 4.5 3.7  --  3.0* 4.0 3.8  CL 93* 96* 100  --  103 102 99  CO2 22 21* 21*  --  24 27 27   GLUCOSE 123* 148* 120*  --  100* 122* 174*  BUN 70* 73* 72*  --  44* 40* 38*  CREATININE 3.15* 2.99* 2.41*  --  1.38* 1.14* 1.03*  CALCIUM 8.8* 8.6* 8.9  --  8.3* 8.5* 8.9  PROT 7.0 6.7  --   --   --   --   --   ALBUMIN 2.8* 2.7* 2.9*  --  2.3* 2.5*  --   AST 30 25  --   --   --   --   --   ALT 12 11  --   --   --   --   --   ALKPHOS 64 57  --   --   --   --   --   BILITOT 2.6* 2.5*  --  2.6*  --   --   --   GFRNONAA 17* 18* 23*  --  45* 56* >60  ANIONGAP 15 13 13   --  8 7 9      Hematology Recent Labs  Lab 07/19/20 0345 07/20/20 0420 07/21/20 0411  WBC 5.4 5.3 5.4  RBC 2.63* 2.77* 2.88*  HGB 8.0* 8.5* 9.0*  HCT  25.0* 27.5* 28.4*  MCV 95.1 99.3 98.6  MCH 30.4 30.7 31.3  MCHC 32.0 30.9 31.7  RDW 21.7* 21.6* 21.9*  PLT 61* 56* 67*    BNP Recent Labs  Lab 07/17/20 0025 07/17/20 0534  BNP 1,768.8* 1,893.4*     DDimer  Recent Labs  Lab 07/16/20 2016  DDIMER 2.60*     Radiology    No results found.  Cardiac Studies   2D echo 07/17/2020: 1. Left ventricular ejection fraction, by estimation, is 50 to 55%. The  left ventricle has low normal function. The left ventricle has no regional  wall motion abnormalities. Left ventricular diastolic parameters are  indeterminate.  2. Right ventricular systolic function is normal. The right ventricular  size is normal. Tricuspid regurgitation signal is inadequate for assessing  PA pressure.  3. Left atrial size was mildly dilated.  4. Right atrial size was mildly dilated.  5. The mitral valve is normal in structure. No evidence of mitral valve  regurgitation. No evidence of mitral stenosis.  6. The aortic valve is normal in structure. Aortic valve regurgitation is  mild. Mild aortic valve stenosis.  7. The inferior vena cava is dilated in size with <50% respiratory  variability, suggesting right atrial pressure of 15 mmHg.   Patient Profile     55 y.o. female with history of fibromyalgia, peripheral nephropathy, asthma, arthritis, ongoing tobacco use, OSA, and admitted 6/2 with acute respiratory failure secondary to acute exacerbation of COPD and acute on chronic HFpEF complicated by frontal lobe CVA, ARF, and severe symptomatic anemia, and who is being evaluated this admission in the setting of elevated troponin, elevated BNP, and acute CVA.  Assessment & Plan    Acute frontal lobe CVA -- Multiple vascular territories concerning for cardiac or aortic embolic etiology.  No evidence of atrial fibrillation on telemetry.  If no arrhythmia/A. fib on telemetry, will need cardiac monitoring at discharge.  Recent TTE with EF 50 to 55%, NR WMA,  mild biatrial enlargement, RAP 15 mmHg.  TEE planned for 07/22/20 with Dr. 59.  Will make NPO after  midnight. Consent obtained via husband given patient's difficulty with hearing during consent.  She is alert and oriented today x3.  No antiplatelets at this time given anemia and thrombocytopenia. Further recommendations per neurology.    N.p.o. after midnight.  TEE 07/22/2020 with Dr. Kirke Corin  Given patient's history of obstructive sleep apnea and BMI of 43.89 kg per/m2, she may require anesthesia to be present. Otherwise, moderate sedation. Will discuss with MD.  Consent obtained as below  Shared Decision Making/Informed Consent The risks [esophageal damage, perforation (1:10,000 risk), bleeding, pharyngeal hematoma as well as other potential complications associated with conscious sedation including aspiration, arrhythmia, respiratory failure and death], benefits (treatment guidance and diagnostic support) and alternatives of a transesophageal echocardiogram were discussed in detail with the husband of Ms. Stenzel and by proxy she is willing to proceed after consent obtained via husband.   Hearing impairment -- Reportedly can read lips with no clear masks available and patient unable to hear me.  TEE consent obtained via husband.  Per patient, her hearing impairment has gotten worse this admission.  Elevated troponin -- Elevated troponin possibly in the setting of acute CVA versus supply demand ischemia given acute hypoxic respiratory failure likely secondary to COPD/asthma exacerbation with volume overload, ARF, and severe anemia.  Cannot rule out ischemic etiology given multiple risk factors, including previous tobacco use, hypertension, hyperlipidemia, BMI.  Heparin has been deferred given minimal troponin elevation and symptomatic anemia.  Echo with low normal EF and NR WMA.  As an outpatient, could consider ischemic evaluation further down the road once recovered from her severe illness/comorbid  conditions.  Will defer any further ischemic work-up at this time.  Acute on chronic HFpEF -- Recent echo as above.  Continue IV Lasix as renal function allows.  With IV diuresis, breathing status has reportedly improved. Daily BMET.  Monitor I/os, daily weights.  Wt 90  92kg. Net output -1059.5cc for yesterday and -3.171.5 cc for the admission. Wean prednisone as tolerated.  Severe symptomatic anemia --Daily CBC.  S/p PRBC.  Hematology following.  Consider is contributing to presentation.  Acute renal failure --Daily BMET.  Caution with nephrotoxins.  Acute hypoxic respiratory failure --Per pulmonology, IM. Unable to obtain CTA due to AKI and VQ cancelled for recurrent hypoxia with elevated ddimer earlier this admission.   Severe encephalopathy --Mentating well today. Per IM, neurology.   For questions or updates, please contact [facility] Please consult [email or web address] for contact info under        Signed, Lennon Alstrom, PA-C  07/21/2020, 3:01 PM

## 2020-07-21 NOTE — Progress Notes (Signed)
   Date of Admission:  07/16/2020      ID: Brandi Holland is a 55 y.o. female  Principal Problem:   Acute encephalopathy Active Problems:   CVA (cerebral vascular accident) (HCC)   Asthma   Acute respiratory failure with hypoxia (HCC)   AKI (acute kidney injury) (HCC)   Hyponatremia   OSA (obstructive sleep apnea)   AMS (altered mental status)   Thrombocytopenia (HCC)   Acute on chronic diastolic heart failure (HCC)   Folate deficiency   Hyperbilirubinemia    Subjective: No fever Mor awake and verbal  Medications:  .  stroke: mapping our early stages of recovery book   Does not apply Once  . budesonide (PULMICORT) nebulizer solution  0.5 mg Nebulization BID  . Chlorhexidine Gluconate Cloth  6 each Topical Q0600  . feeding supplement  237 mL Oral BID BM  . folic acid  1 mg Oral Daily  . furosemide  40 mg Intravenous Daily  . ipratropium-albuterol  3 mL Nebulization Q6H  . mouth rinse  15 mL Mouth Rinse BID  . nicotine  21 mg Transdermal Daily  . predniSONE  60 mg Oral Q breakfast  . sodium chloride flush  3 mL Intravenous Q12H    Objective: Vital signs in last 24 hours: Temp:  [98.2 F (36.8 C)-98.9 F (37.2 C)] 98.9 F (37.2 C) (06/06 0800) Pulse Rate:  [87-102] 93 (06/06 0800) Resp:  [17-26] 21 (06/06 0800) BP: (105-139)/(53-76) 139/76 (06/06 0800) SpO2:  [95 %-100 %] 100 % (06/06 0800) Weight:  [92 kg] 92 kg (06/06 0500)  PHYSICAL EXAM:  General: awake, chronically ill Head: Normocephalic, without obvious abnormality, atraumatic. Lungs:b/l air entry Heart: Regular rate and rhythm, no murmur, rub or gallop. Abdomen: Soft, non-tender,not distended. Bowel sounds normal. No masses Extremities:edema ankles. Dry skin  Lymph: Cervical, supraclavicular normal. Neurologic:not examine din detail Lab Results Recent Labs    07/20/20 0420 07/21/20 0411  WBC 5.3 5.4  HGB 8.5* 9.0*  HCT 27.5* 28.4*  NA 136 135  K 4.0 3.8  CL 102 99  CO2 27 27  BUN 40* 38*   CREATININE 1.14* 1.03*   Liver Panel Recent Labs    07/18/20 1216 07/19/20 0345 07/20/20 0420  ALBUMIN  --  2.3* 2.5*  BILITOT 2.6*  --   --   BILIDIR 0.9*  --   --   IBILI 1.7*  --   --    Sedimentation Rate No results for input(s): ESRSEDRATE in the last 72 hours. C-Reactive Protein No results for input(s): CRP in the last 72 hours.  Microbiology: 6/1 One set MSSE 07/18/20  no growth Studies/Results: No results found.   Assessment/Plan:  Staph epidermidis and staph capitis  in 1 set of blood culture These are contaminants- hence will not treat- Dc cefazolin  AKI Anemia Thrombocytopenia Hemolytic anemia Autoimmune work up pending Followed by heme- started on steroids  AMS- improved B/l embolic infarcts Clinically /histroy not typical for endocarditis but TEE is needed to look for cardiac source of emboli  CHF   Discussed the management with hospitalist  ID will follow her peripherally till TEE is done .

## 2020-07-21 NOTE — Progress Notes (Signed)
Patient is alert and oriented to self only. Patient started out the shift being alert and oriented to self and place. As the shift has gone on the patient has become more confused and restless. Patient has been asking me to call her husband to tell him to come get her. Patient stated "its cruel that y'all are laughing at me". Patient was reassured that no one was laughing at her. Earlier in the shift patient ripped out her IV and was unable to tell me why she did it or if she remembered doing it. New IV was placed. Patient did not sleep and did not want anything to help her sleep. I will continue patient care.

## 2020-07-21 NOTE — Progress Notes (Signed)
Inpatient Rehabilitation Admissions Coordinator  Noted patient on O2 1 to 2 liters [place] now. I will place order for rehab consult and an admissions coordinator will follow up for full assessment.  Ottie Glazier, RN, MSN Rehab Admissions Coordinator [phone] 07/21/2020 8:37 AM

## 2020-07-21 NOTE — Progress Notes (Signed)
PROGRESS NOTE    Brandi Holland  QVZ:[id number] DOB: 1965-06-27 DOA: 07/16/2020 PCP: Pcp, No   Brief Narrative: Taken from H&P.  Brandi Holland is a 55 y.o. female with medical history significant of asthma, OSA, fibromyalgia who presents via EMS for shortness of breath.  She was hypoxic in 60s requiring 5 to 6 L of oxygen with intermittent nonrebreather. Initially admitting provider unable to obtained any history as there was no family member at bedside, no one could be reached on phone and patient was very altered.  Able to get hold of husband after multiple attempts at bedside, according to him patient was not feeling well for the past few weeks, she was admitted at [facility] on 06/18/2020 with lower extremity edema and was diuresed and discharged on 06/20/2020.  No echocardiogram done, CTA was negative for PE at that time, no significant abnormality on labs except sodium of 122 which improved to 130 and mild AKI, unknown baseline. Patient continued to feel bad and she was staying in bed for the past more than 2 weeks, unable to get out of bed, very poor p.o. intake.  Per husband she did had diarrhea with black color stool and he has seen some blood in stool last week, diarrhea lasted for 2 days and then resolved.  He did not noticed any hematemesis.  Patient does not drink alcohol but is a heavy smoker, smokes at least 2 packs/day.  Husband was able to brought her to ED 2 weeks ago and then she left after waiting couple of hours without being seen.  Per husband she was refusing to go see a doctor, when she continued to deteriorate he asked her son who lives in [place] who drove from there and talked with her and called the EMS. Husband denies any recent upper respiratory illnesses, fever or chills.  He did noticed worsening shortness of breath.  Patient was mostly bedbound for the past 2 weeks, he was using diapers to help her as she was unable to go to the bathroom by herself due to profound  weakness.  Patient remained very altered with intermittent desaturation, ABG with some respiratory alkalosis, no hypoxia.1/2 blood culture bottles with MSSE, MRI brain with multiple bilateral infarcts, worsening renal function so unable to obtain CTA, VQ scan ordered but got canceled due to recurrent desaturation.  D-dimer at 2.6, hemoglobin of 5.7, platelet counts of 97 this morning and there were 255 during recent hospitalization at [facility], BUN of 73 and creatinine of 2.99, sodium of 130, chloride of 96 and bicarb of 21.  Elevated T bili at 2.5 and INR of 1.5, lactic acidosis 3.2>>2.5>>1.9, elevated troponin 708>>904, BNP at 1893, CT chest and abdomen with bilateral groundglass opacities which can be due to pulmonary edema versus atypical pneumonia and no significant abnormality on CT abdomen.  Echocardiogram with EF of 50 to 55%, which is within lower normal limit, no wall motion abnormalities and indeterminate diastolic parameters, mildly biatrial dilatation and dilated inferior vena cava with less than 50% variability. Received total of 3 units PRBC 6/3:Had swallow evaluation today and they are recommending dysphagia 2 diet. 6/4: Cardiology started her on diuresis with IV Lasix 40 mg daily, some improvement in breathing status. 6/5: Significant improvement in breathing status, worsening thrombocytopenia, started on prednisone 6/6: No new complaint, platelets started improving.  Subjective: Patient continues to improve, some improvement in platelet counts after starting prednisone.  Saturating 100% on 2 L of oxygen.  Husband at bedside.  Assessment &  Plan:   Principal Problem:   Acute encephalopathy Active Problems:   CVA (cerebral vascular accident) (HCC)   Asthma   Acute respiratory failure with hypoxia (HCC)   AKI (acute kidney injury) (HCC)   Hyponatremia   OSA (obstructive sleep apnea)   AMS (altered mental status)   Thrombocytopenia (HCC)   Acute on chronic diastolic heart failure  (HCC)   Folate deficiency   Hyperbilirubinemia  Severe acute encephalopathy.  Resolved  Unknown etiology and can be multifactorial with bilateral strokes. No obvious source of infection except 1 out of 2 blood culture with MSSE which is most likely a contaminant, procalcitonin at 0.35>>0.27, concern of cardiac embolic source which can be due to endocarditis but less likely with negative blood cultures or a thrombus secondary to heart failure?? -Continue to monitor  Acute hypoxic respiratory failure.  Improving, currently on 2 L of oxygen  Patient has an history of heavy smoking but no underlying diagnosis of COPD, not on home oxygen.  Initially required nonrebreather and then later switched to heated high flow. Might be due to encephalopathy versus pulmonary edema.  Elevated D-dimer but unable to obtain CTA due to AKI, VQ scan got canceled due to recurrent desaturation. Cardiology started her on IV Lasix Continue with supplemental oxygen-wean as tolerated.  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  Hemoglobin and platelet started improving, she was started on prednisone yesterday.  Platelets at 67 today. 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  Might have GI bleed as husband was mentioning about dark color stools while she was having diarrhea for 2 days last week.  No recurrence at this time. Anemia panel with iron and folate deficiency.  Patient received 1 unit while in the ED, and 2 L next day. Received IV iron X 2 doses.  Hematology is involved-appreciate their help.  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  Rest of the labs pending. -Continue prednisone 60 mg daily. -Monitor CBC -Check FOBT-still pending -Can involve GI-hold at this time as she is not actively bleeding and she might have mucosal bleed with thrombocytopenia.  CT abdomen with no concern of  cirrhosis.  HFpEF/Elevated troponin.  No prior history of heart failure. Echocardiogram with low normal EF and indeterminant diastolic dysfunction, most likely diastolic heart failure.  Lung sounds quite wet. Cardiology is on board. -Continue Lasix 40 mg IV daily. -Daily BMP and weight -Strict intake and output  Hypoalbuminemia.  Causing some third spacing. -Give her albumin IV to help with diuresis and third spacing.  Hypophosphatemia. -Replace phosphorous as needed and monitor  Bilateral multifocal cerebral infarct.  Most likely secondary to cardiac emboli, TTE without any obvious source, will need TEE and rule out PFO.  Extremity Doppler scans were negative for any DVT, 1 small acute superficial thrombophlebitis in upper extremity. MRA without any significant stenosis. Neurology is on board and patient is being worked up for stroke. No anticoagulation at this time due to severe anemia and thrombocytopenia. Patient needs TEE-most likely will be done tomorrow  1/2 blood cultures with MSSE.  Most likely a contaminant.  Patient is afebrile with no leukocytosis, borderline procalcitonin.  Cannot ignore it due to her acute illness and concern of endocarditis. Patient received 1 dose of cefepime and 2 doses of vancomycin earlier during admission.  And received 5 days of cefazolin.  No need for more antibiotics at this time. -Repeat blood culture -negative. -TTE without any significant abnormality -Possible TEE  to rule out cardiac source of emboli-most likely be done next week once more stable.  Hyponatremia.  Resolved Sodium was 122 during recent admission at [facility] earlier in May, improved to 135 today.  Most likely secondary to poor p.o. intake and volume overload. -Patient is on maintenance IV fluid. -Monitor sodium  AKI.  Improving  Unknown baseline, continue to improve, at 1.14 today.  It was 1.2 during her recent admission at [facility].  Renal ultrasound without any significant abnormality.   Foley was placed for strict intake and output. -Monitor renal function -Avoid nephrotoxins -Discontinue Foley today and give her a voiding trial as she is more alert now.  Stage III obesity. Estimated body mass index is 43.89 kg/m as calculated from the following:   Height as of this encounter: 4\' 9"  (1.448 m).   Weight as of this encounter: 92 kg.  This will complicate Holland prognosis.  Objective: Vitals:   07/21/20 1200 07/21/20 1300 07/21/20 1400 07/21/20 1406  BP: (!) 109/59 118/60 (!) 129/99   Pulse: (!) 114 (!) 104 96 96  Resp: 20 19 17  (!) 22  Temp: 98.5 F (36.9 C)     TempSrc: Axillary     SpO2: 91% 93% 96% 97%  Weight:      Height:        Intake/Output Summary (Last 24 hours) at 07/21/2020 1504 Last data filed at 07/21/2020 0600 Gross per 24 hour  Intake 659.87 ml  Output 725 ml  Net -65.13 ml   Filed Weights   07/18/20 0500 07/19/20 0500 07/21/20 0500  Weight: 91.7 kg 90.3 kg 92 kg    Examination:  General.  Well-developed obese lady, in no acute distress. Pulmonary.  Lungs clear bilaterally, normal respiratory effort. CV.  Regular rate and rhythm, no JVD, rub or murmur. Abdomen.  Soft, nontender, nondistended, BS positive. CNS.  Alert and oriented x3.  No focal neurologic deficit. Extremities.  No edema, no cyanosis, pulses intact and symmetrical. Psychiatry.  Judgment and insight appears normal.  DVT prophylaxis: SCDs, patient has severe anemia and thrombocytopenia. Code Status: Full Family Communication: Discussed with husband at bedside. Disposition Plan:  Status is: Inpatient  Remains inpatient appropriate because:Inpatient level of care appropriate due to severity of illness   Dispo: The patient is from: Home              Anticipated d/c is to: To be determined              Patient currently is not medically stable to d/c.   Difficult to place patient No               Level of care: Med-Surg  All the records are reviewed and case  discussed with Care Management/Social Worker. Management plans discussed with the patient, nursing and they are in agreement.  Consultants:   Neurology  Cardiology  ID  Hematology  Procedures:  Antimicrobials:   Data Reviewed: I have personally reviewed following labs and imaging studies  CBC: Recent Labs  Lab 07/17/20 1227 07/18/20 0115 07/18/20 0555 07/18/20 1216 07/19/20 0345 07/20/20 0420 07/21/20 0411  WBC 6.3 7.6 7.1 6.7 5.4 5.3 5.4  NEUTROABS 4.5 5.7  --   --  3.6 3.1  --   HGB 6.5*  6.6* 8.1* 8.0* 8.3* 8.0* 8.5* 9.0*  HCT 20.6*  20.9* 24.9* 25.7* 25.9* 25.0* 27.5* 28.4*  MCV 96.7 94.7 95.9 94.9 95.1 99.3 98.6  PLT 80* 79* 77* 73* 61* 56* 67*   Basic  Metabolic Panel: Recent Labs  Lab 07/17/20 0534 07/18/20 0115 07/19/20 0345 07/20/20 0420 07/21/20 0411  NA 130* 134* 135 136 135  K 4.5 3.7 3.0* 4.0 3.8  CL 96* 100 103 102 99  CO2 21* 21* GLUCOSE 148* 120* 100* 122* 174*  BUN 73* 72* 44* 40* 38*  CREATININE 2.99* 2.41* 1.38* 1.14* 1.03*  CALCIUM 8.6* 8.9 8.3* 8.5* 8.9  MG  --   --  2.2  --   --   PHOS  --  5.1* 2.5 1.8*  --    GFR: Estimated Creatinine Clearance: 57.8 mL/min (A) (by C-G formula based on SCr of 1.03 mg/dL (H)). Liver Function Tests: Recent Labs  Lab 07/16/20 2016 07/17/20 0534 07/18/20 0115 07/18/20 1216 07/19/20 0345 07/20/20 0420  AST 30 25  --   --   --   --   ALT 12 11  --   --   --   --   ALKPHOS 64 57  --   --   --   --   BILITOT 2.6* 2.5*  --  2.6*  --   --   PROT 7.0 6.7  --   --   --   --   ALBUMIN 2.8* 2.7* 2.9*  --  2.3* 2.5*   Recent Labs  Lab 07/16/20 2016  LIPASE 32   Recent Labs  Lab 07/18/20 0837  AMMONIA 17   Coagulation Profile: Recent Labs  Lab 07/17/20 0018 07/17/20 0534 07/20/20 1106  INR 1.5* 1.5* 1.2   Cardiac Enzymes: Recent Labs  Lab 07/16/20 2016  CKTOTAL 266*   BNP (last 3 results) No results for input(s): PROBNP in the last 8760 hours. HbA1C: Recent Labs     07/19/20 1033  HGBA1C 5.3   CBG: Recent Labs  Lab 07/18/20 0022  GLUCAP 112*   Lipid Profile: No results for input(s): CHOL, HDL, LDLCALC, TRIG, CHOLHDL, LDLDIRECT in the last 72 hours. Thyroid Function Tests: No results for input(s): TSH, T4TOTAL, FREET4, T3FREE, THYROIDAB in the last 72 hours. Anemia Panel: No results for input(s): VITAMINB12, FOLATE, FERRITIN, TIBC, IRON, RETICCTPCT in the last 72 hours. Sepsis Labs: Recent Labs  Lab 07/16/20 2016 07/17/20 0534 07/17/20 0901 07/17/20 1227 07/18/20 0115  PROCALCITON 0.35 0.34  --   --  0.27  LATICACIDVEN 3.2*  --  2.5* 1.9  --     Recent Results (from the past 240 hour(s))  Blood culture (single)     Status: Abnormal   Collection Time: 07/16/20  8:07 PM   Specimen: BLOOD  Result Value Ref Range Status   Specimen Description   Final    BLOOD LEFT ANTECUBITAL Performed at [facility], [address], [place], [place] [address]    Special Requests   Final    BOTTLES DRAWN AEROBIC AND ANAEROBIC Blood Culture results may not be optimal due to an inadequate volume of blood received in culture bottles Performed at [facility], [address] [place], [place] [address]    Culture  Setup Time   Final    GRAM POSITIVE COCCI IN BOTH AEROBIC AND ANAEROBIC BOTTLES CRITICAL RESULT CALLED TO, READ BACK BY AND VERIFIED WITH: DEVIN MITCHELL 1200 07/17/2020 DLB    Culture (A)  Final    STAPHYLOCOCCUS EPIDERMIDIS STAPHYLOCOCCUS CAPITIS THE SIGNIFICANCE OF ISOLATING THIS ORGANISM FROM A SINGLE SET OF BLOOD CULTURES WHEN MULTIPLE SETS ARE DRAWN IS UNCERTAIN. PLEASE NOTIFY THE MICROBIOLOGY DEPARTMENT WITHIN ONE WEEK IF SPECIATION  AND SENSITIVITIES ARE REQUIRED. Performed at [facility], [address] [place], [place] [address]    Report Status 07/19/2020 FINAL  Final  Blood Culture ID Panel (Reflexed)     Status: Abnormal   Collection Time: 07/16/20  8:07 PM  Result Value Ref Range Status   Enterococcus  faecalis NOT DETECTED NOT DETECTED Final   Enterococcus Faecium NOT DETECTED NOT DETECTED Final   Listeria monocytogenes NOT DETECTED NOT DETECTED Final   Staphylococcus species DETECTED (A) NOT DETECTED Final    Comment: CRITICAL RESULT CALLED TO, READ BACK BY AND VERIFIED WITH: DEVIN MITCHELL 1200 07/17/2020 DLB    Staphylococcus aureus (BCID) NOT DETECTED NOT DETECTED Final   Staphylococcus epidermidis DETECTED (A) NOT DETECTED Final    Comment: CRITICAL RESULT CALLED TO, READ BACK BY AND VERIFIED WITH: DEVIN MITCHELL 1200 07/17/2020 DLB    Staphylococcus lugdunensis NOT DETECTED NOT DETECTED Final   Streptococcus species NOT DETECTED NOT DETECTED Final   Streptococcus agalactiae NOT DETECTED NOT DETECTED Final   Streptococcus pneumoniae NOT DETECTED NOT DETECTED Final   Streptococcus pyogenes NOT DETECTED NOT DETECTED Final   A.calcoaceticus-baumannii NOT DETECTED NOT DETECTED Final   Bacteroides fragilis NOT DETECTED NOT DETECTED Final   Enterobacterales NOT DETECTED NOT DETECTED Final   Enterobacter cloacae complex NOT DETECTED NOT DETECTED Final   Escherichia coli NOT DETECTED NOT DETECTED Final   Klebsiella aerogenes NOT DETECTED NOT DETECTED Final   Klebsiella oxytoca NOT DETECTED NOT DETECTED Final   Klebsiella pneumoniae NOT DETECTED NOT DETECTED Final   Proteus species NOT DETECTED NOT DETECTED Final   Salmonella species NOT DETECTED NOT DETECTED Final   Serratia marcescens NOT DETECTED NOT DETECTED Final   Haemophilus influenzae NOT DETECTED NOT DETECTED Final   Neisseria meningitidis NOT DETECTED NOT DETECTED Final   Pseudomonas aeruginosa NOT DETECTED NOT DETECTED Final   Stenotrophomonas maltophilia NOT DETECTED NOT DETECTED Final   Candida albicans NOT DETECTED NOT DETECTED Final   Candida auris NOT DETECTED NOT DETECTED Final   Candida glabrata NOT DETECTED NOT DETECTED Final   Candida krusei NOT DETECTED NOT DETECTED Final   Candida parapsilosis NOT DETECTED NOT  DETECTED Final   Candida tropicalis NOT DETECTED NOT DETECTED Final   Cryptococcus neoformans/gattii NOT DETECTED NOT DETECTED Final   Methicillin resistance mecA/C NOT DETECTED NOT DETECTED Final    Comment: Performed at [facility], [address] [place], [place] [address]  Resp Panel by RT-PCR (Flu A&B, Covid) Nasopharyngeal Swab     Status: None   Collection Time: 07/16/20  8:16 PM   Specimen: Nasopharyngeal Swab; Nasopharyngeal(NP) swabs in vial transport medium  Result Value Ref Range Status   SARS Coronavirus 2 by RT PCR NEGATIVE NEGATIVE Final    Comment: (NOTE) SARS-CoV-2 target nucleic acids are NOT DETECTED.  The SARS-CoV-2 RNA is generally detectable in upper respiratory specimens during the acute phase of infection. The lowest concentration of SARS-CoV-2 viral copies this assay can detect is 138 copies/mL. A negative result does not preclude SARS-Cov-2 infection and should not be used as the sole basis for treatment or other patient management decisions. A negative result may occur with  improper specimen collection/handling, submission of specimen other than nasopharyngeal swab, presence of viral mutation(s) within the areas targeted by this assay, and inadequate number of viral copies(<138 copies/mL). A negative result must be combined with clinical observations, patient history, and epidemiological information. The expected result is Negative.  Fact Sheet for Patients:  BloggerCourse.com  Fact Sheet  for Healthcare Providers:  SeriousBroker.ithttps://www.fda.gov/media/152162/download  This test is no t yet approved or cleared by the [place] FDA and  has been authorized for detection and/or diagnosis of SARS-CoV-2 by FDA under an Emergency Use Authorization (EUA). This EUA will remain  in effect (meaning this test can be used) for the duration of the COVID-19 declaration under Section 564(b)(1) of the Act, 21 U.S.C.section 360bbb-3(b)(1),  unless the authorization is terminated  or revoked sooner.       Influenza A by PCR NEGATIVE NEGATIVE Final   Influenza B by PCR NEGATIVE NEGATIVE Final    Comment: (NOTE) The Xpert Xpress SARS-CoV-2/FLU/RSV plus assay is intended as an aid in the diagnosis of influenza from Nasopharyngeal swab specimens and should not be used as a sole basis for treatment. Nasal washings and aspirates are unacceptable for Xpert Xpress SARS-CoV-2/FLU/RSV testing.  Fact Sheet for Patients: BloggerCourse.comhttps://www.fda.gov/media/152166/download  Fact Sheet for Healthcare Providers: SeriousBroker.ithttps://www.fda.gov/media/152162/download  This test is not yet approved or cleared by the [place] FDA and has been authorized for detection and/or diagnosis of SARS-CoV-2 by FDA under an Emergency Use Authorization (EUA). This EUA will remain in effect (meaning this test can be used) for the duration of the COVID-19 declaration under Section 564(b)(1) of the Act, 21 U.S.C. section 360bbb-3(b)(1), unless the authorization is terminated or revoked.  Performed at [facility], [address] [place], [place] [address]   MRSA PCR Screening     Status: None   Collection Time: 07/18/20 12:38 AM   Specimen: Nasal Mucosa; Nasopharyngeal  Result Value Ref Range Status   MRSA by PCR NEGATIVE NEGATIVE Final    Comment:        The GeneXpert MRSA Assay (FDA approved for NASAL specimens only), is one component of a comprehensive MRSA colonization surveillance program. It is not intended to diagnose MRSA infection nor to guide or monitor treatment for MRSA infections. Performed at [facility], [address] [place], [place] [address]   CULTURE, BLOOD (ROUTINE X 2) w Reflex to ID Panel     Status: None (Preliminary result)   Collection Time: 07/18/20  3:13 PM   Specimen: BLOOD  Result Value Ref Range Status   Specimen Description BLOOD BLOOD RIGHT FOREARM  Final   Special Requests   Final    BOTTLES  DRAWN AEROBIC AND ANAEROBIC Blood Culture adequate volume   Culture   Final    NO GROWTH 3 DAYS Performed at [facility], [address] [place], [place] [address]    Report Status PENDING  Incomplete  CULTURE, BLOOD (ROUTINE X 2) w Reflex to ID Panel     Status: None (Preliminary result)   Collection Time: 07/18/20  3:17 PM   Specimen: BLOOD  Result Value Ref Range Status   Specimen Description BLOOD BLOOD RIGHT HAND  Final   Special Requests   Final    BOTTLES DRAWN AEROBIC AND ANAEROBIC Blood Culture adequate volume   Culture   Final    NO GROWTH 3 DAYS Performed at [facility], [address] [place], [place] [address]    Report Status PENDING  Incomplete     Radiology Studies: No results found.  Scheduled Meds: .  stroke: mapping our early stages of recovery book   Does not apply Once  . budesonide (PULMICORT) nebulizer solution  0.5 mg Nebulization BID  . Chlorhexidine Gluconate Cloth  6 each Topical Q0600  . feeding supplement  237 mL Oral BID BM  .  folic acid  1 mg Oral Daily  . furosemide  40 mg Intravenous Daily  . ipratropium-albuterol  3 mL Nebulization Q6H  . mouth rinse  15 mL Mouth Rinse BID  . nicotine  21 mg Transdermal Daily  . predniSONE  60 mg Oral Q breakfast  . sodium chloride flush  3 mL Intravenous Q12H   Continuous Infusions: . albumin human 25 g (07/21/20 1052)     LOS: 4 days   Time spent: 35 minutes. More than 50% of the time was spent in counseling/coordination of care. Patient is critically sick with multiorgan dysfunction and high risk for deterioration and death.  Arnetha Courser, MD [facility]  If 7PM-7AM, please contact night-coverage [email or web address]  07/21/2020, 3:04 PM   This record has been created using Conservation officer, historic buildings. Errors have been sought and corrected,but may not always be located. Such creation errors do not reflect on the standard of care.

## 2020-07-21 NOTE — Progress Notes (Signed)
Chaplain Maggie made follow up visitation with patient and her husband at bedside. Patient noted she is confused as she waits for staff to come take her to a procedure. She spoke of how it is unlike her to not go outside. She is missing the sunshine. We spoke of how difficult it can be to wait and of the challenge of unexpected events. Patient's husband noted that he believes she is "afraid to close her eyes" so she is not resting or sleeping. Chaplain offered space for exploring feelings and for prayer which patient noted was meaningful to her. She expressed, "people like you make [place] feel like we belong". Chaplain available for continued spiritual and emotional support as needed.

## 2020-07-21 NOTE — Progress Notes (Signed)
Physical Therapy Treatment Patient Details Name: Brandi Holland MRN: [id number] DOB: 1965/04/21 Today's Date: 07/21/2020    History of Present Illness Pt is a 55 y/o F with PMH: unremarkable due to noncompliance with medical care. Pt presented to ED with 2-week history of feeling unwell/acting confused. Pt was seen at an OSH for concern for fluid overload with CHF as differentials. 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 2D Echo: unlikely HF, but does show left and right atrial enlargement. Blood cultures + for staph epi. Pt adm for encephalopathy with CVAs and ICU d/t respiratory failure (currently on [facility]).    PT Comments    Patient is making progress towards meeting PT goals. Patient with improved independence with transfers. Max A of one person provided for sit to stand transfers x 2 bouts. Patient also progressed to taking a few steps with maximal assistance and facilitation for knee extension. Heart rate briefly up to 136bpm with activity, otherwise in 110's-120's. Sp02 remained in the 90's with activity on 2 L02. Patient continues to need extensive physical therapy to maximize independence and facilitate return to prior level of function. Supportive spouse at the bedside asking questions about rehab at discharge. Highly recommend CIR at discharge.     Follow Up Recommendations  CIR     Equipment Recommendations   (to be determined at next level of care)    Recommendations for Other Services       Precautions / Restrictions Precautions Precautions: Fall Restrictions Weight Bearing Restrictions: No    Mobility  Bed Mobility Overal bed mobility: Needs Assistance Bed Mobility: Supine to Sit;Sit to Supine     Supine to sit: Mod assist Sit to supine: Mod assist   General bed mobility comments: assistance for LE and trunk support. verbal and tactile cues for sequencing and  technique    Transfers Overall transfer level: Needs assistance Equipment used: None Transfers: Sit to/from Stand Sit to Stand: Max assist         General transfer comment: patient performed 2 sit to stand transfers from bed with one person assistance. faciliation for lift off and decreased eccentric control  Ambulation/Gait Ambulation/Gait assistance: Max assist Gait Distance (Feet): 2 Feet Assistive device: None       General Gait Details: patient able to take 3-4 small side steps to the left with maximal cues for technique. weight shifting and steadying assistance provided. patient able to accept partial weight through BLE with standing, however faciliation required for knee extension to prevent buckling with taking steps. heart rate up to 136 briefly with standing activity. Sp02 in the 90's with activity   Stairs             Wheelchair Mobility    Modified Rankin (Stroke Patients Only)       Balance           Standing balance support: No upper extremity supported Standing balance-Leahy Scale: Poor Standing balance comment: Mod A for standing balance                            Cognition Arousal/Alertness: Awake/alert Behavior During Therapy: WFL for tasks assessed/performed Overall Cognitive Status: Impaired/Different from baseline                                 General Comments: patient is  able to follow single step commands with extra time most of the time. patient is hard of hearing and frequent repetition      Exercises      General Comments        Pertinent Vitals/Pain Pain Assessment: No/denies pain    Home Living                      Prior Function            PT Goals (current goals can now be found in the care plan section) Acute Rehab PT Goals Patient Stated Goal: to get stronger PT Goal Formulation: With patient Time For Goal Achievement: 08/02/20 Potential to Achieve Goals: Good Progress  towards PT goals: Progressing toward goals    Frequency    7X/week      PT Plan Current plan remains appropriate    Co-evaluation              AM-PAC PT "6 Clicks" Mobility   Outcome Measure  Help needed turning from your back to your side while in a flat bed without using bedrails?: A Little Help needed moving from lying on your back to sitting on the side of a flat bed without using bedrails?: A Lot Help needed moving to and from a bed to a chair (including a wheelchair)?: A Lot Help needed standing up from a chair using your arms (e.g., wheelchair or bedside chair)?: A Lot Help needed to walk in hospital room?: A Lot Help needed climbing 3-5 steps with a railing? : Total 6 Click Score: 12    End of Session Equipment Utilized During Treatment: Gait belt;Oxygen Activity Tolerance: Patient tolerated treatment well Patient left: in bed;with call bell/phone within reach;with family/visitor present Nurse Communication: Mobility status PT Visit Diagnosis: Muscle weakness (generalized) (M62.81);Difficulty in walking, not elsewhere classified (R26.2);Apraxia (R48.2)     Time: 2956-2130 PT Time Calculation (min) (ACUTE ONLY): 33 min  Charges:  $Therapeutic Activity: 23-37 mins                     Donna Bernard, PT, MPT    Ina Homes 07/21/2020, 10:54 AM

## 2020-07-22 LAB — CBC WITH DIFFERENTIAL/PLATELET
Abs Immature Granulocytes: 0.15 10*3/uL — ABNORMAL HIGH (ref 0.00–0.07)
Basophils Absolute: 0 10*3/uL (ref 0.0–0.1)
Basophils Relative: 0 %
Eosinophils Absolute: 0.1 10*3/uL (ref 0.0–0.5)
Eosinophils Relative: 1 %
HCT: 28.5 % — ABNORMAL LOW (ref 36.0–46.0)
Hemoglobin: 8.7 g/dL — ABNORMAL LOW (ref 12.0–15.0)
Immature Granulocytes: 2 %
Lymphocytes Relative: 38 %
Lymphs Abs: 2.6 10*3/uL (ref 0.7–4.0)
MCH: 31 pg (ref 26.0–34.0)
MCHC: 30.5 g/dL (ref 30.0–36.0)
MCV: 101.4 fL — ABNORMAL HIGH (ref 80.0–100.0)
Monocytes Absolute: 0.6 10*3/uL (ref 0.1–1.0)
Monocytes Relative: 8 %
Neutro Abs: 3.4 10*3/uL (ref 1.7–7.7)
Neutrophils Relative %: 51 %
Platelets: 78 10*3/uL — ABNORMAL LOW (ref 150–400)
RBC: 2.81 MIL/uL — ABNORMAL LOW (ref 3.87–5.11)
RDW: 22.9 % — ABNORMAL HIGH (ref 11.5–15.5)
Smear Review: NORMAL
WBC: 6.9 10*3/uL (ref 4.0–10.5)
nRBC: 0 % (ref 0.0–0.2)

## 2020-07-22 LAB — BASIC METABOLIC PANEL
Anion gap: 7 (ref 5–15)
BUN: 39 mg/dL — ABNORMAL HIGH (ref 6–20)
CO2: 26 mmol/L (ref 22–32)
Calcium: 9.1 mg/dL (ref 8.9–10.3)
Chloride: 101 mmol/L (ref 98–111)
Creatinine, Ser: 1.04 mg/dL — ABNORMAL HIGH (ref 0.44–1.00)
GFR, Estimated: >60 mL/min (ref >60)
Glucose, Bld: 105 mg/dL — ABNORMAL HIGH (ref 70–99)
Potassium: 3.7 mmol/L (ref 3.5–5.1)
Sodium: 134 mmol/L — ABNORMAL LOW (ref 135–145)

## 2020-07-22 LAB — PHOSPHORUS: Phosphorus: 3.2 mg/dL (ref 2.5–4.6)

## 2020-07-22 MED ORDER — FUROSEMIDE 40 MG PO TABS
40.0000 mg | ORAL_TABLET | Freq: Every day | ORAL | Status: DC
  Administered 2020-07-22 – 2020-08-01 (×11): 40 mg via ORAL
  Filled 2020-07-22 (×5): qty 1
  Filled 2020-07-22: qty 2
  Filled 2020-07-22 (×3): qty 1
  Filled 2020-07-22: qty 2
  Filled 2020-07-22: qty 1

## 2020-07-22 MED ORDER — SODIUM CHLORIDE 0.9 % IV SOLN: INTRAVENOUS | Status: DC

## 2020-07-22 NOTE — Consult Note (Signed)
Physical Medicine and Rehabilitation Consult Reason for Consult: Encephalopathy Referring Physician: Arnetha Courser, MD   HPI: Brandi Holland is a 55 y.o. female with PMH of asthma, OSA, and fibromyalgia, who presented via EMS with shortness of breath. She was hypoxic to 60s requiring nonrebreather.   As per hospitalist note: Able to get hold of husband after multiple attempts at bedside, according to him patient was not feeling well for the past few weeks, she was admitted at [facility] on 06/18/2020 with lower extremity edema and was diuresed and discharged on 06/20/2020.  No echocardiogram done, CTA was negative for PE at that time, no significant abnormality on labs except sodium of 122 which improved to 130 and mild AKI, unknown baseline. Patient continued to feel bad and she was staying in bed for the past more than 2 weeks, unable to get out of bed, very poor p.o. intake.  Per husband she did had diarrhea with black color stool and he has seen some blood in stool last week, diarrhea lasted for 2 days and then resolved.  He did not noticed any hematemesis.  Patient does not drink alcohol but is a heavy smoker, smokes at least 2 packs/day.  Husband was able to brought her to ED 2 weeks ago and then she left after waiting couple of hours without being seen.  Per husband she was refusing to go see a doctor, when she continued to deteriorate he asked her son who lives in [place] who drove from there and talked with her and called the EMS. Husband denies any recent upper respiratory illnesses, fever or chills.  He did noticed worsening shortness of breath.  Patient was mostly bedbound for the past 2 weeks, he was using diapers to help her as she was unable to go to the bathroom by herself due to profound weakness.  Patient remained very altered with intermittent desaturation, ABG with some respiratory alkalosis, no hypoxia.1/2 blood culture bottles with MSSE, MRI brain with multiple bilateral infarcts,  worsening renal function so unable to obtain CTA, VQ scan ordered but got canceled due to recurrent desaturation.  D-dimer at 2.6, hemoglobin of 5.7, platelet counts of 97 this morning and there were 255 during recent hospitalization at [facility], BUN of 73 and creatinine of 2.99, sodium of 130, chloride of 96 and bicarb of 21.  Elevated T bili at 2.5 and INR of 1.5, lactic acidosis 3.2>>2.5>>1.9, elevated troponin 708>>904, BNP at 1893, CT chest and abdomen with bilateral groundglass opacities which can be due to pulmonary edema versus atypical pneumonia and no significant abnormality on CT abdomen.  Echocardiogram with EF of 50 to 55%, which is within lower normal limit, no wall motion abnormalities and indeterminate diastolic parameters, mildly biatrial dilatation and dilated inferior vena cava with less than 50% variability. Received total of 3 units PRBC 6/3:Had swallow evaluation today and they are recommending dysphagia 2 diet. 6/4: Cardiology started her on diuresis with IV Lasix 40 mg daily, some improvement in breathing status. 6/5: Significant improvement in breathing status, worsening thrombocytopenia, started on prednisone 6/6: No new complaint, platelets started improving. 6/7: TEE got canceled again as patient was difficult to arouse this morning, apparently was very agitated overnight and received a dose of Ativan, we will attempt again tomorrow.  Physical Medicine & Rehabilitation was consulted to assess candidacy for CIR.    Review of Systems  Constitutional: Positive for malaise/fatigue.  HENT: Positive for hearing loss.   Eyes: Negative.   Respiratory: Positive  for shortness of breath.   Cardiovascular: Negative.   Gastrointestinal: Negative.   Genitourinary: Negative.   Skin: Negative.   Neurological: Positive for weakness.  Endo/Heme/Allergies: Negative.   Psychiatric/Behavioral: Negative.    Past Medical History:  Diagnosis Date  . Arthritis   . Asthma   . Fibromyalgia    . OSA (obstructive sleep apnea)   . Peripheral neuropathy    History reviewed. No pertinent surgical history. Family History  Problem Relation Age of Onset  . Chorea Mother   . Emphysema Mother   . Hypertension Mother   . Thyroid disease Mother   . Ulcers Mother   . Dementia Father   . Cancer Father   . Cancer Sister   . Chorea Sister   . Heart disease Sister   . Osteoporosis Sister    Social History:  has no history on file for tobacco use, alcohol use, and drug use. Allergies:  Allergies  Allergen Reactions  . Clarithromycin Swelling  . Sulfa Antibiotics Swelling   Medications Prior to Admission  Medication Sig Dispense Refill  . clotrimazole (LOTRIMIN) 1 % cream Apply 1 application topically 2 (two) times daily.    Marland Kitchen. PROAIR HFA 108 (90 Base) MCG/ACT inhaler Inhale 2 puffs into the lungs every 4 (four) hours as needed.    . furosemide (LASIX) 20 MG tablet Take 20 mg by mouth daily.      Home: Home Living Family/patient expects to be discharged to:: Private residence Living Arrangements: Spouse/significant other Available Help at Discharge: Family,Available PRN/intermittently Type of Home: House Home Access: Stairs to enter,Ramped entrance Entergy CorporationEntrance Stairs-Number of Steps: 3, does not have to use entrance with steps Home Layout: One level Home Equipment: Walker - 2 wheels,Cane - single point,Shower seat - built in,Bedside commode,Wheelchair - manual Additional Comments: all above-listed equipment was stored, belonged to patient's grandmother.  Functional History: Prior Function Level of Independence: Independent Comments: Pt was INDEP including driving and running errands, was declining and requiring increased assitance from spouse over course of 2 weeks d/t generally feeling poorly. Functional Status:  Mobility: Bed Mobility Overal bed mobility: Needs Assistance Bed Mobility: Supine to Sit,Sit to Supine Rolling: Mod assist Supine to sit: Mod assist,+2 for  physical assistance Sit to supine: Mod assist General bed mobility comments: needs assist for initiation of mobility efforts including sliding B LE off bed. Cues for sequencing. Once seated, able to sit with cga Transfers Overall transfer level: Needs assistance Equipment used: 2 person hand held assist Transfers: Sit to/from Stand Sit to Stand: Mod assist,+2 physical assistance Squat pivot transfers: Mod assist,+2 physical assistance General transfer comment: performed several reps of sit<>Stands from multiple surface including bed and recliner. Pt  with initial mod post leaning with standing, however progesses to min LOB. Cues for pursed lip breathing as pt tending to perform rapid and shallow breaths. All mobility performed on 1L of O2 with sats at 96%. Ambulation/Gait Ambulation/Gait assistance: Max assist,+2 physical assistance Gait Distance (Feet): 2 Feet Assistive device: 2 person hand held assist Gait Pattern/deviations: Step-to pattern General Gait Details: took several shuffle steps over to the recliner with +2 HHA. Tries to sit prior to surface behind her with cues for safety. Fatigues quickly, however follows commands well.    ADL: ADL Overall ADL's : Needs assistance/impaired Grooming: Moderate assistance,Cueing for sequencing,Sitting,Brushing hair General ADL Comments: Pt continues to require multimodal cueing to complete BADL tasks. This date, she is able to perform grooming tasks (hair combing and applying lotion) given MOD A  and hand over hand prompting to initiate task. Total A to apply lotion to back.  Cognition: Cognition Overall Cognitive Status: Impaired/Different from baseline Orientation Level: Oriented to person,Oriented to place,Disoriented to time,Disoriented to situation Cognition Arousal/Alertness: Awake/alert Behavior During Therapy: WFL for tasks assessed/performed,Flat affect Overall Cognitive Status: Impaired/Different from baseline Area of Impairment:  Attention,Following commands,Safety/judgement,Awareness Orientation Level: Disoriented to,Place,Time,Situation Current Attention Level: Alternating Memory: Decreased short-term memory Following Commands: Follows one step commands inconsistently Safety/Judgement: Decreased awareness of deficits Awareness: Emergent Problem Solving: Slow processing,Decreased initiation,Difficulty sequencing,Requires verbal cues,Requires tactile cues General Comments: Takes extended time for processing. Almost child-like cognition. Confused with no recall of previous session Difficult to assess due to: Hard of hearing/deaf  Blood pressure 115/66, pulse 99, temperature 97.9 F (36.6 C), temperature source Oral, resp. rate 20, height 4\' 9"  (1.448 m), weight 91.7 kg, SpO2 100 %. Physical Exam  Gen: no distress, normal appearing HEENT: oral mucosa pink and moist, NCAT, hard of hearing Cardio: Reg rate Chest: labored breathing,  in place Abd: soft, non-distended Ext: no edema Psych: pleasant, normal affect Skin: intact Neuro: alert and oriented x3 but with impaired insight Musculoskeletal: Diffuse, non-focal weakness  Results for orders placed or performed during the hospital encounter of 07/16/20 (from the past 24 hour(s))  Basic metabolic panel     Status: Abnormal   Collection Time: 07/22/20  4:11 AM  Result Value Ref Range   Sodium 134 (L) 135 - 145 mmol/L   Potassium 3.7 3.5 - 5.1 mmol/L   Chloride 101 98 - 111 mmol/L   CO2 26 22 - 32 mmol/L   Glucose, Bld 105 (H) 70 - 99 mg/dL   BUN 39 (H) 6 - 20 mg/dL   Creatinine, Ser 09/21/20 (H) 0.44 - 1.00 mg/dL   Calcium 9.1 8.9 - 5.10 mg/dL   GFR, Estimated 25.8 >52 mL/min   Anion gap 7 5 - 15  CBC with Differential/Platelet     Status: Abnormal   Collection Time: 07/22/20  4:11 AM  Result Value Ref Range   WBC 6.9 4.0 - 10.5 K/uL   RBC 2.81 (L) 3.87 - 5.11 MIL/uL   Hemoglobin 8.7 (L) 12.0 - 15.0 g/dL   HCT 09/21/20 (L) 82.4 - 23.5 %   MCV 101.4 (H) 80.0 -  100.0 fL   MCH 31.0 26.0 - 34.0 pg   MCHC 30.5 30.0 - 36.0 g/dL   RDW 36.1 (H) 44.3 - 15.4 %   Platelets 78 (L) 150 - 400 K/uL   nRBC 0.0 0.0 - 0.2 %   Neutrophils Relative % 51 %   Neutro Abs 3.4 1.7 - 7.7 K/uL   Lymphocytes Relative 38 %   Lymphs Abs 2.6 0.7 - 4.0 K/uL   Monocytes Relative 8 %   Monocytes Absolute 0.6 0.1 - 1.0 K/uL   Eosinophils Relative 1 %   Eosinophils Absolute 0.1 0.0 - 0.5 K/uL   Basophils Relative 0 %   Basophils Absolute 0.0 0.0 - 0.1 K/uL   WBC Morphology MORPHOLOGY UNREMARKABLE    Smear Review Normal platelet morphology    Immature Granulocytes 2 %   Abs Immature Granulocytes 0.15 (H) 0.00 - 0.07 K/uL   Ovalocytes PRESENT   Phosphorus     Status: None   Collection Time: 07/22/20  4:11 AM  Result Value Ref Range   Phosphorus 3.2 2.5 - 4.6 mg/dL   No results found.  Assessment/Plan: 1) Encephalopathy -Discussed with Brandi Holland and her husband that she would benefit from further rehabilitation  either in CIR or SNF but unfortunately [facility] is not within her network. Encouraged husband to discuss with SW alternative inpatient rehabilitation programs that may be in her network and he expressed understanding.   Horton Chin, MD 07/22/2020

## 2020-07-22 NOTE — Progress Notes (Signed)
   Recheck of patient per MD, is unable to complete transesophageal echocardiogram this morning due to altered mental status.  Recheck of patient with orientation questions asked.   She is AAOx3.    Spoke with nursing staff with recommendation to avoid heavy Ativan use overnight.   Re-consented for procedure/TEE on 07/23/2020.   Will make n.p.o. overnight with tentative plan for TEE with Dr. Azucena Cecil 07/23/2020.  Signed, Lennon Alstrom, PA-C 07/22/2020, 5:12 PM

## 2020-07-22 NOTE — Progress Notes (Signed)
Physical Therapy Treatment Patient Details Name: Brandi Holland MRN: [id number] DOB: 10-01-1965 Today's Date: 07/22/2020    History of Present Illness Pt is a 55 y/o F with PMH: unremarkable due to noncompliance with medical care. Pt presented to ED with 2-week history of feeling unwell/acting confused. Pt was seen at an OSH for concern for fluid overload with CHF as differentials. 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 2D Echo: unlikely HF, but does show left and right atrial enlargement. Blood cultures + for staph epi. Pt adm for encephalopathy with CVAs and ICU d/t respiratory failure.    PT Comments    Pt is making good progress towards goals. Not agitated this session, pleasant and motivated. Still remains +2 for deficits including cognition/weakness. Able to progress taking steps to recliner this date. All mobility performed on 1L of O2, weaned down from 2L of O2. Needs heavy assist for there-ex, easily distracted. Supportive husband in room. Will continue to progress as able including AD trials.    Follow Up Recommendations  CIR     Equipment Recommendations   (TBD)    Recommendations for Other Services       Precautions / Restrictions Precautions Precautions: Fall Restrictions Weight Bearing Restrictions: No    Mobility  Bed Mobility Overal bed mobility: Needs Assistance Bed Mobility: Supine to Sit;Sit to Supine     Supine to sit: Mod assist;+2 for physical assistance     General bed mobility comments: needs assist for initiation of mobility efforts including sliding B LE off bed. Cues for sequencing. Once seated, able to sit with cga    Transfers Overall transfer level: Needs assistance Equipment used: 2 person hand held assist Transfers: Sit to/from Stand Sit to Stand: Mod assist;+2 physical assistance   Squat pivot transfers: Mod assist;+2 physical assistance      General transfer comment: performed several reps of sit<>Stands from multiple surface including bed and recliner. Pt  with initial mod post leaning with standing, however progesses to min LOB. Cues for pursed lip breathing as pt tending to perform rapid and shallow breaths. All mobility performed on 1L of O2 with sats at 96%.  Ambulation/Gait Ambulation/Gait assistance: Max assist;+2 physical assistance Gait Distance (Feet): 2 Feet Assistive device: 2 person hand held assist Gait Pattern/deviations: Step-to pattern     General Gait Details: took several shuffle steps over to the recliner with +2 HHA. Tries to sit prior to surface behind her with cues for safety. Fatigues quickly, however follows commands well.   Stairs             Wheelchair Mobility    Modified Rankin (Stroke Patients Only)       Balance Overall balance assessment: Needs assistance Sitting-balance support: Feet unsupported;Single extremity supported Sitting balance-Leahy Scale: Good Sitting balance - Comments: Steady static sitting, reaching within BOS.   Standing balance support: During functional activity;Bilateral upper extremity supported Standing balance-Leahy Scale: Fair Standing balance comment: MIN A for static standing.                            Cognition Arousal/Alertness: Awake/alert Behavior During Therapy: WFL for tasks assessed/performed;Flat affect Overall Cognitive Status: Impaired/Different from baseline Area of Impairment: Attention;Following commands;Safety/judgement;Awareness                   Current Attention Level: Alternating Memory: Decreased short-term memory Following Commands: Follows one  step commands inconsistently Safety/Judgement: Decreased awareness of deficits Awareness: Emergent Problem Solving: Slow processing;Decreased initiation;Difficulty sequencing;Requires verbal cues;Requires tactile cues General Comments: Takes extended time for  processing. Almost child-like cognition. Confused with no recall of previous session      Exercises Other Exercises Other Exercises: seated/standing ther-ex performed x 10 reps including LAQ, alt. seated marching, hip abd/add, and standing alt. marching (has difficulty). Mod assist for performance. Other Exercises: OT facilitates seated grooming tasks at EOB. OT/PT faciltate bed mobility, STS attempts x3, functional transfer to recliner with safety education and multimodal cueing t/o session.    General Comments        Pertinent Vitals/Pain Pain Assessment: No/denies pain    Home Living                      Prior Function            PT Goals (current goals can now be found in the care plan section) Acute Rehab PT Goals Patient Stated Goal: to get stronger PT Goal Formulation: With patient Time For Goal Achievement: 08/02/20 Potential to Achieve Goals: Good Progress towards PT goals: Progressing toward goals    Frequency    7X/week      PT Plan Current plan remains appropriate    Co-evaluation PT/OT/SLP Co-Evaluation/Treatment: Yes Reason for Co-Treatment: Complexity of the patient's impairments (multi-system involvement);To address functional/ADL transfers;Necessary to address cognition/behavior during functional activity;For patient/therapist safety PT goals addressed during session: Mobility/safety with mobility;Strengthening/ROM;Balance OT goals addressed during session: ADL's and self-care;Proper use of Adaptive equipment and DME      AM-PAC PT "6 Clicks" Mobility   Outcome Measure  Help needed turning from your back to your side while in a flat bed without using bedrails?: A Little Help needed moving from lying on your back to sitting on the side of a flat bed without using bedrails?: A Lot Help needed moving to and from a bed to a chair (including a wheelchair)?: A Lot Help needed standing up from a chair using your arms (e.g., wheelchair or bedside  chair)?: A Lot Help needed to walk in hospital room?: A Lot Help needed climbing 3-5 steps with a railing? : Total 6 Click Score: 12    End of Session Equipment Utilized During Treatment: Gait belt;Oxygen Activity Tolerance: Patient tolerated treatment well Patient left: in chair;with family/visitor present Nurse Communication: Mobility status PT Visit Diagnosis: Muscle weakness (generalized) (M62.81);Difficulty in walking, not elsewhere classified (R26.2);Apraxia (R48.2)     Time: 1359-1440 PT Time Calculation (min) (ACUTE ONLY): 41 min  Charges:  $Gait Training: 8-22 mins $Therapeutic Exercise: 8-22 mins                     Elizabeth Palau, PT, DPT [phone]    Kyung Muto 07/22/2020, 4:30 PM

## 2020-07-22 NOTE — Progress Notes (Signed)
PROGRESS NOTE    [name]  ZOX:[id number] DOB: 1965/10/10 DOA: 07/16/2020 PCP: Pcp, No   Brief Narrative: Taken from H&P.  Brandi Holland is a 55 y.o. female with medical history significant of asthma, OSA, fibromyalgia who presents via EMS for shortness of breath.  She was hypoxic in 60s requiring 5 to 6 L of oxygen with intermittent nonrebreather. Initially admitting provider unable to obtained any history as there was no family member at bedside, no one could be reached on phone and patient was very altered.  Able to get hold of husband after multiple attempts at bedside, according to him patient was not feeling well for the past few weeks, she was admitted at [facility] on 06/18/2020 with lower extremity edema and was diuresed and discharged on 06/20/2020.  No echocardiogram done, CTA was negative for PE at that time, no significant abnormality on labs except sodium of 122 which improved to 130 and mild AKI, unknown baseline. Patient continued to feel bad and she was staying in bed for the past more than 2 weeks, unable to get out of bed, very poor p.o. intake.  Per husband she did had diarrhea with black color stool and he has seen some blood in stool last week, diarrhea lasted for 2 days and then resolved.  He did not noticed any hematemesis.  Patient does not drink alcohol but is a heavy smoker, smokes at least 2 packs/day.  Husband was able to brought her to ED 2 weeks ago and then she left after waiting couple of hours without being seen.  Per husband she was refusing to go see a doctor, when she continued to deteriorate he asked her son who lives in [place] who drove from there and talked with her and called the EMS. Husband denies any recent upper respiratory illnesses, fever or chills.  He did noticed worsening shortness of breath.  Patient was mostly bedbound for the past 2 weeks, he was using diapers to help her as she was unable to go to the bathroom by herself due to profound  weakness.  Patient remained very altered with intermittent desaturation, ABG with some respiratory alkalosis, no hypoxia.1/2 blood culture bottles with MSSE, MRI brain with multiple bilateral infarcts, worsening renal function so unable to obtain CTA, VQ scan ordered but got canceled due to recurrent desaturation.  D-dimer at 2.6, hemoglobin of 5.7, platelet counts of 97 this morning and there were 255 during recent hospitalization at [facility], BUN of 73 and creatinine of 2.99, sodium of 130, chloride of 96 and bicarb of 21.  Elevated T bili at 2.5 and INR of 1.5, lactic acidosis 3.2>>2.5>>1.9, elevated troponin 708>>904, BNP at 1893, CT chest and abdomen with bilateral groundglass opacities which can be due to pulmonary edema versus atypical pneumonia and no significant abnormality on CT abdomen.  Echocardiogram with EF of 50 to 55%, which is within lower normal limit, no wall motion abnormalities and indeterminate diastolic parameters, mildly biatrial dilatation and dilated inferior vena cava with less than 50% variability. Received total of 3 units PRBC 6/3:Had swallow evaluation today and they are recommending dysphagia 2 diet. 6/4: Cardiology started her on diuresis with IV Lasix 40 mg daily, some improvement in breathing status. 6/5: Significant improvement in breathing status, worsening thrombocytopenia, started on prednisone 6/6: No new complaint, platelets started improving. 6/7: TEE got canceled again as patient was difficult to arouse this morning, apparently was very agitated overnight and received a dose of Ativan, we will attempt again tomorrow.  Subjective: Patient was quite somnolent when seen this morning.  Apparently received a dose of Ativan in the latter half of the night due to worsening agitation.  Husband at bedside.  Saturating 100% on 2 L of oxygen.  Assessment & Plan:   Principal Problem:   Acute encephalopathy Active Problems:   CVA (cerebral vascular accident) (HCC)    Asthma   Acute respiratory failure with hypoxia (HCC)   AKI (acute kidney injury) (HCC)   Hyponatremia   OSA (obstructive sleep apnea)   AMS (altered mental status)   Thrombocytopenia (HCC)   Acute on chronic diastolic heart failure (HCC)   Folate deficiency   Hyperbilirubinemia  Severe acute encephalopathy.  Resolved  Unknown etiology and can be multifactorial with bilateral strokes. No obvious source of infection except 1 out of 2 blood culture with MSSE which is most likely a contaminant, procalcitonin at 0.35>>0.27, concern of cardiac embolic source which can be due to endocarditis but less likely with negative blood cultures or a thrombus secondary to heart failure?? -Continue to monitor  Acute hypoxic respiratory failure.  Improving, currently on 2 L of oxygen  Patient has an history of heavy smoking but no underlying diagnosis of COPD, not on home oxygen.  Initially required nonrebreather and then later switched to heated high flow. Might be due to encephalopathy versus pulmonary edema.  Elevated D-dimer but unable to obtain CTA due to AKI, VQ scan got canceled due to recurrent desaturation. Cardiology started her on IV Lasix Continue with supplemental oxygen-wean as tolerated.  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  Hemoglobin and platelet started improving, she was started on prednisone yesterday.  Platelets at 78 today. 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  Might have GI bleed as husband was mentioning about dark color stools while she was having diarrhea for 2 days last week.  No recurrence at this time. Anemia panel with iron and folate deficiency.  Patient received 1 unit while in the ED, and 2 L next day. Received IV iron X 2 doses.  Hematology is involved-appreciate their help.  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  Rest of the labs  pending. -Continue prednisone 60 mg daily-hematology to determine the duration. -Monitor CBC -Check FOBT-still pending -Can involve GI-hold at this time as she is not actively bleeding and she might have mucosal bleed with thrombocytopenia.  CT abdomen with no concern of cirrhosis.  HFpEF/Elevated troponin.  No prior history of heart failure. Echocardiogram with low normal EF and indeterminant diastolic dysfunction, most likely diastolic heart failure.  Lung sounds quite wet. Cardiology is on board. -Continue Lasix 40 mg IV daily. -Daily BMP and weight -Strict intake and output  Hypoalbuminemia.  Causing some third spacing. Received albumin for hypoalbuminemia and to improve diuresis and third spacing. -Will discontinue albumin today.  Hypophosphatemia. -Replace phosphorous as needed and monitor  Bilateral multifocal cerebral infarct.  Most likely secondary to cardiac emboli, TTE without any obvious source, will need TEE and rule out PFO.  Extremity Doppler scans were negative for any DVT, 1 small acute superficial thrombophlebitis in upper extremity. MRA without any significant stenosis. Neurology is on board and patient is being worked up for stroke. No anticoagulation at this time due to severe anemia and thrombocytopenia. Patient needs TEE-most likely will be done tomorrow  1/2 blood cultures with MSSE.  Most likely a contaminant.  Patient is afebrile with no leukocytosis, borderline procalcitonin.  Patient received 1  dose of cefepime and 2 doses of vancomycin earlier during admission.  And received 5 days of cefazolin.  No need for more antibiotics at this time. -Repeat blood culture -negative. -TTE without any significant abnormality -Possible TEE to rule out cardiac source of emboli-most likely be done next week once more stable.  Hyponatremia.  Resolved Sodium was 122 during recent admission at [facility] earlier in May, improved to 135 today.  Most likely secondary to poor p.o.  intake and volume overload. -Patient is on maintenance IV fluid. -Monitor sodium  AKI.  Improving  Unknown baseline, continue to improve, at 1.04 today.  It was 1.2 during her recent admission at [facility].  Renal ultrasound without any significant abnormality.  Foley was placed for strict intake and output. -Monitor renal function -Avoid nephrotoxins  Stage III obesity. Estimated body mass index is 43.75 kg/m as calculated from the following:   Height as of this encounter:  (1.448 m).   Weight as of this encounter: 91.7 kg.  This will complicate overall prognosis.  Objective: Vitals:   07/22/20 0945 07/22/20 0946 07/22/20 1410 07/22/20 1500  BP:   115/66   Pulse: (!) 102 89 (!) 107 99  Resp: 11 10 (!) 22 20  Temp:   97.9 F (36.6 C)   TempSrc:   Oral   SpO2: (!) 85% 90% 96% 100%  Weight:      Height:        Intake/Output Summary (Last 24 hours) at 07/22/2020 1519 Last data filed at 07/22/2020 1422 Gross per 24 hour  Intake 240 ml  Output 975 ml  Net -735 ml   Filed Weights   07/19/20 0500 07/21/20 0500 07/22/20 0500  Weight: 90.3 kg 92 kg 91.7 kg    Examination:  General.  Chronically ill-appearing, somnolent lady, in no acute distress. Pulmonary.  Lungs clear bilaterally, normal respiratory effort. CV.  Regular rate and rhythm, no JVD, rub or murmur. Abdomen.  Soft, nontender, nondistended, BS positive. CNS.  Alert and oriented x3.  No focal neurologic deficit. Extremities.  Trace LE edema, no cyanosis, pulses intact and symmetrical. Psychiatry.  Judgment and insight appears impaired.  DVT prophylaxis: SCDs, patient has severe anemia and thrombocytopenia. Code Status: Full Family Communication: Discussed with husband at bedside. Disposition Plan:  Status is: Inpatient  Remains inpatient appropriate because:Inpatient level of care appropriate due to severity of illness   Dispo: The patient is from: Home              Anticipated d/c is to: To be determined               Patient currently is not medically stable to d/c.   Difficult to place patient No               Level of care: Med-Surg  All the records are reviewed and case discussed with Care Management/Social Worker. Management plans discussed with the patient, nursing and they are in agreement.  Consultants:   Neurology  Cardiology  ID  Hematology  Procedures:  Antimicrobials:   Data Reviewed: I have personally reviewed following labs and imaging studies  CBC: Recent Labs  Lab 07/17/20 1227 07/18/20 0115 07/18/20 0555 07/18/20 1216 07/19/20 0345 07/20/20 0420 07/21/20 0411 07/22/20 0411  WBC 6.3 7.6   < > 6.7 5.4 5.3 5.4 6.9  NEUTROABS 4.5 5.7  --   --  3.6 3.1  --  3.4  HGB 6.5*  6.6* 8.1*   < > 8.3* 8.0*  8.5* 9.0* 8.7*  HCT 20.6*  20.9* 24.9*   < > 25.9* 25.0* 27.5* 28.4* 28.5*  MCV 96.7 94.7   < > 94.9 95.1 99.3 98.6 101.4*  PLT 80* 79*   < > 73* 61* 56* 67* 78*   < > = values in this interval not displayed.   Basic Metabolic Panel: Recent Labs  Lab 07/18/20 0115 07/19/20 0345 07/20/20 0420 07/21/20 0411 07/22/20 0411  NA 134* 135 136 135 134*  K 3.7 3.0* 4.0 3.8 3.7  CL 100 103 102 99 101  CO2 21* GLUCOSE 120* 100* 122* 174* 105*  BUN 72* 44* 40* 38* 39*  CREATININE 2.41* 1.38* 1.14* 1.03* 1.04*  CALCIUM 8.9 8.3* 8.5* 8.9 9.1  MG  --  2.2  --   --   --   PHOS 5.1* 2.5 1.8*  --  3.2   GFR: Estimated Creatinine Clearance: 57 mL/min (A) (by C-G formula based on SCr of 1.04 mg/dL (H)). Liver Function Tests: Recent Labs  Lab 07/16/20 2016 07/17/20 0534 07/18/20 0115 07/18/20 1216 07/19/20 0345 07/20/20 0420  AST 30 25  --   --   --   --   ALT 12 11  --   --   --   --   ALKPHOS 64 57  --   --   --   --   BILITOT 2.6* 2.5*  --  2.6*  --   --   PROT 7.0 6.7  --   --   --   --   ALBUMIN 2.8* 2.7* 2.9*  --  2.3* 2.5*   Recent Labs  Lab 07/16/20 2016  LIPASE 32   Recent Labs  Lab 07/18/20 0837  AMMONIA 17   Coagulation  Profile: Recent Labs  Lab 07/17/20 0018 07/17/20 0534 07/20/20 1106  INR 1.5* 1.5* 1.2   Cardiac Enzymes: Recent Labs  Lab 07/16/20 2016  CKTOTAL 266*   BNP (last 3 results) No results for input(s): PROBNP in the last 8760 hours. HbA1C: No results for input(s): HGBA1C in the last 72 hours. CBG: Recent Labs  Lab 07/18/20 0022  GLUCAP 112*   Lipid Profile: No results for input(s): CHOL, HDL, LDLCALC, TRIG, CHOLHDL, LDLDIRECT in the last 72 hours. Thyroid Function Tests: No results for input(s): TSH, T4TOTAL, FREET4, T3FREE, THYROIDAB in the last 72 hours. Anemia Panel: No results for input(s): VITAMINB12, FOLATE, FERRITIN, TIBC, IRON, RETICCTPCT in the last 72 hours. Sepsis Labs: Recent Labs  Lab 07/16/20 2016 07/17/20 0534 07/17/20 0901 07/17/20 1227 07/18/20 0115  PROCALCITON 0.35 0.34  --   --  0.27  LATICACIDVEN 3.2*  --  2.5* 1.9  --     Recent Results (from the past 240 hour(s))  Blood culture (single)     Status: Abnormal   Collection Time: 07/16/20  8:07 PM   Specimen: BLOOD  Result Value Ref Range Status   Specimen Description   Final    BLOOD LEFT ANTECUBITAL Performed at [facility], [address], [place], [place] [address]    Special Requests   Final    BOTTLES DRAWN AEROBIC AND ANAEROBIC Blood Culture results may not be optimal due to an inadequate volume of blood received in culture bottles Performed at [facility], [address], [place], [place] [address]    Culture  Setup Time   Final    GRAM POSITIVE COCCI IN BOTH AEROBIC AND ANAEROBIC BOTTLES CRITICAL RESULT CALLED TO, READ  BACK BY AND VERIFIED WITH: DEVIN MITCHELL 1200 07/17/2020 DLB    Culture (A)  Final    STAPHYLOCOCCUS EPIDERMIDIS STAPHYLOCOCCUS CAPITIS THE SIGNIFICANCE OF ISOLATING THIS ORGANISM FROM A SINGLE SET OF BLOOD CULTURES WHEN MULTIPLE SETS ARE DRAWN IS UNCERTAIN. PLEASE NOTIFY THE MICROBIOLOGY DEPARTMENT WITHIN ONE WEEK IF SPECIATION AND SENSITIVITIES  ARE REQUIRED. Performed at [facility], [address], [place], [place] [address]    Report Status 07/19/2020 FINAL  Final  Blood Culture ID Panel (Reflexed)     Status: Abnormal   Collection Time: 07/16/20  8:07 PM  Result Value Ref Range Status   Enterococcus faecalis NOT DETECTED NOT DETECTED Final   Enterococcus Faecium NOT DETECTED NOT DETECTED Final   Listeria monocytogenes NOT DETECTED NOT DETECTED Final   Staphylococcus species DETECTED (A) NOT DETECTED Final    Comment: CRITICAL RESULT CALLED TO, READ BACK BY AND VERIFIED WITH: DEVIN MITCHELL 1200 07/17/2020 DLB    Staphylococcus aureus (BCID) NOT DETECTED NOT DETECTED Final   Staphylococcus epidermidis DETECTED (A) NOT DETECTED Final    Comment: CRITICAL RESULT CALLED TO, READ BACK BY AND VERIFIED WITH: DEVIN MITCHELL 1200 07/17/2020 DLB    Staphylococcus lugdunensis NOT DETECTED NOT DETECTED Final   Streptococcus species NOT DETECTED NOT DETECTED Final   Streptococcus agalactiae NOT DETECTED NOT DETECTED Final   Streptococcus pneumoniae NOT DETECTED NOT DETECTED Final   Streptococcus pyogenes NOT DETECTED NOT DETECTED Final   A.calcoaceticus-baumannii NOT DETECTED NOT DETECTED Final   Bacteroides fragilis NOT DETECTED NOT DETECTED Final   Enterobacterales NOT DETECTED NOT DETECTED Final   Enterobacter cloacae complex NOT DETECTED NOT DETECTED Final   Escherichia coli NOT DETECTED NOT DETECTED Final   Klebsiella aerogenes NOT DETECTED NOT DETECTED Final   Klebsiella oxytoca NOT DETECTED NOT DETECTED Final   Klebsiella pneumoniae NOT DETECTED NOT DETECTED Final   Proteus species NOT DETECTED NOT DETECTED Final   Salmonella species NOT DETECTED NOT DETECTED Final   Serratia marcescens NOT DETECTED NOT DETECTED Final   Haemophilus influenzae NOT DETECTED NOT DETECTED Final   Neisseria meningitidis NOT DETECTED NOT DETECTED Final   Pseudomonas aeruginosa NOT DETECTED NOT DETECTED Final   Stenotrophomonas maltophilia  NOT DETECTED NOT DETECTED Final   Candida albicans NOT DETECTED NOT DETECTED Final   Candida auris NOT DETECTED NOT DETECTED Final   Candida glabrata NOT DETECTED NOT DETECTED Final   Candida krusei NOT DETECTED NOT DETECTED Final   Candida parapsilosis NOT DETECTED NOT DETECTED Final   Candida tropicalis NOT DETECTED NOT DETECTED Final   Cryptococcus neoformans/gattii NOT DETECTED NOT DETECTED Final   Methicillin resistance mecA/C NOT DETECTED NOT DETECTED Final    Comment: Performed at [facility], [address] [place], [place] [address]  Resp Panel by RT-PCR (Flu A&B, Covid) Nasopharyngeal Swab     Status: None   Collection Time: 07/16/20  8:16 PM   Specimen: Nasopharyngeal Swab; Nasopharyngeal(NP) swabs in vial transport medium  Result Value Ref Range Status   SARS Coronavirus 2 by RT PCR NEGATIVE NEGATIVE Final    Comment: (NOTE) SARS-CoV-2 target nucleic acids are NOT DETECTED.  The SARS-CoV-2 RNA is generally detectable in upper respiratory specimens during the acute phase of infection. The lowest concentration of SARS-CoV-2 viral copies this assay can detect is 138 copies/mL. A negative result does not preclude SARS-Cov-2 infection and should not be used as the sole basis for treatment or other patient management decisions. A negative result may occur with  improper specimen collection/handling,  submission of specimen other than nasopharyngeal swab, presence of viral mutation(s) within the areas targeted by this assay, and inadequate number of viral copies(<138 copies/mL). A negative result must be combined with clinical observations, patient history, and epidemiological information. The expected result is Negative.  Fact Sheet for Patients:  BloggerCourse.com  Fact Sheet for Healthcare Providers:  SeriousBroker.it  This test is no t yet approved or cleared by the [place] FDA and  has been authorized  for detection and/or diagnosis of SARS-CoV-2 by FDA under an Emergency Use Authorization (EUA). This EUA will remain  in effect (meaning this test can be used) for the duration of the COVID-19 declaration under Section 564(b)(1) of the Act, 21 U.S.C.section 360bbb-3(b)(1), unless the authorization is terminated  or revoked sooner.       Influenza A by PCR NEGATIVE NEGATIVE Final   Influenza B by PCR NEGATIVE NEGATIVE Final    Comment: (NOTE) The Xpert Xpress SARS-CoV-2/FLU/RSV plus assay is intended as an aid in the diagnosis of influenza from Nasopharyngeal swab specimens and should not be used as a sole basis for treatment. Nasal washings and aspirates are unacceptable for Xpert Xpress SARS-CoV-2/FLU/RSV testing.  Fact Sheet for Patients: BloggerCourse.com  Fact Sheet for Healthcare Providers: SeriousBroker.it  This test is not yet approved or cleared by the [place] FDA and has been authorized for detection and/or diagnosis of SARS-CoV-2 by FDA under an Emergency Use Authorization (EUA). This EUA will remain in effect (meaning this test can be used) for the duration of the COVID-19 declaration under Section 564(b)(1) of the Act, 21 U.S.C. section 360bbb-3(b)(1), unless the authorization is terminated or revoked.  Performed at [facility], [address] [place], [place] [address]   MRSA PCR Screening     Status: None   Collection Time: 07/18/20 12:38 AM   Specimen: Nasal Mucosa; Nasopharyngeal  Result Value Ref Range Status   MRSA by PCR NEGATIVE NEGATIVE Final    Comment:        The GeneXpert MRSA Assay (FDA approved for NASAL specimens only), is one component of a comprehensive MRSA colonization surveillance program. It is not intended to diagnose MRSA infection nor to guide or monitor treatment for MRSA infections. Performed at [facility], [address] [place], [place] [address]    CULTURE, BLOOD (ROUTINE X 2) w Reflex to ID Panel     Status: None (Preliminary result)   Collection Time: 07/18/20  3:13 PM   Specimen: BLOOD  Result Value Ref Range Status   Specimen Description BLOOD BLOOD RIGHT FOREARM  Final   Special Requests   Final    BOTTLES DRAWN AEROBIC AND ANAEROBIC Blood Culture adequate volume   Culture   Final    NO GROWTH 4 DAYS Performed at [facility], [address] [place], [place] [address]    Report Status PENDING  Incomplete  CULTURE, BLOOD (ROUTINE X 2) w Reflex to ID Panel     Status: None (Preliminary result)   Collection Time: 07/18/20  3:17 PM   Specimen: BLOOD  Result Value Ref Range Status   Specimen Description BLOOD BLOOD RIGHT HAND  Final   Special Requests   Final    BOTTLES DRAWN AEROBIC AND ANAEROBIC Blood Culture adequate volume   Culture   Final    NO GROWTH 4 DAYS Performed at [facility], [address], [place], [place] [address]    Report Status PENDING  Incomplete     Radiology Studies: No  results found.  Scheduled Meds: .  stroke: mapping our early stages of recovery book   Does not apply Once  . budesonide (PULMICORT) nebulizer solution  0.5 mg Nebulization BID  . Chlorhexidine Gluconate Cloth  6 each Topical Q0600  . feeding supplement  237 mL Oral BID BM  . folic acid  1 mg Oral Daily  . furosemide  40 mg Oral Daily  . ipratropium-albuterol  3 mL Nebulization Q6H  . mouth rinse  15 mL Mouth Rinse BID  . nicotine  21 mg Transdermal Daily  . predniSONE  60 mg Oral Q breakfast  . sodium chloride flush  3 mL Intravenous Q12H   Continuous Infusions: . sodium chloride 20 mL/hr at 07/22/20 0634  . albumin human 25 g (07/22/20 1031)     LOS: 5 days   Time spent: 30 minutes. More than 50% of the time was spent in counseling/coordination of care. Patient is critically sick with multiorgan dysfunction and high risk for deterioration and death.  Arnetha CourserSumayya Etta Gassett, MD [facility]  If  7PM-7AM, please contact night-coverage [email or web address]  07/22/2020, 3:19 PM   This record has been created using Conservation officer, historic buildingsDragon voice recognition software. Errors have been sought and corrected,but may not always be located. Such creation errors do not reflect on the standard of care.

## 2020-07-22 NOTE — Progress Notes (Signed)
Inpatient Rehab Admissions Coordinator:   Consult received.  Note pt's insurance carrier is out of network, with no out of network benefits.  Reached out to [employer], LCSW, and Dr. Nelson Chimes, for referral to in-network AIR facility.  [facility] will sign off at this time.   Estill Dooms, PT, DPT Admissions Coordinator [phone] 07/22/20  10:54 AM

## 2020-07-22 NOTE — Progress Notes (Signed)
Chaplain Maggie made follow up visitation with patient and her husband. He noted they are still waiting for her procedure which is expected today. Patient was asleep at the time of visitation. Chaplain offered empathetic listening to spouse who expressed the difficulty of waiting. Continued support available as needed.

## 2020-07-22 NOTE — Progress Notes (Signed)
Occupational Therapy Treatment Patient Details Name: Brandi Holland MRN: [id number] DOB: 1965-07-13 Today's Date: 07/22/2020    History of present illness Pt is a 55 y/o F with PMH: unremarkable due to noncompliance with medical care. Pt presented to ED with 2-week history of feeling unwell/acting confused. Pt was seen at an OSH for concern for fluid overload with CHF as differentials. W/u at [facility] included: MRI showing multiple embolic looking infarctions in bilateral hemispheres concerning for a central embolic source. 2D Echo: unlikely HF, but does show left and right atrial enlargement. Blood cultures + for staph epi. Pt adm for encephalopathy with CVAs and ICU d/t respiratory failure (currently on [facility]).   OT comments  Brandi Holland was seen for OT/PT co- treatment on this date. Upon arrival to room pt appears to be asleep in bed with husband at bedside. She rouses to VCs, and agreeable to therapy session. Pt requires +2 MOD A to come to sitting at EOB. While seated unsupported at EOB she engages in grooming tasks including combing hair and applying lotion to arms/back as facilitated by therapist. Pt requires MOD A with hand over hand cueing to complete tasks. She requires multimodal cueing t/o session to maximize participation and sequencing. Pt able to perform STS and stand pivot transfer to room recliner with +2 MOD A and 2-person handheld assist. She states "that feels better" once seated upright in recliner.  Pt making good progress toward goals and continues to benefit from skilled OT services to maximize return to PLOF and minimize risk of future falls, injury, caregiver burden, and readmission. Will continue to follow POC. Discharge recommendation remains appropriate.    Follow Up Recommendations  CIR    Equipment Recommendations  Other (comment) (Defer to next venue of care.)    Recommendations for Other Services      Precautions / Restrictions Precautions Precautions:  Fall Restrictions Weight Bearing Restrictions: No       Mobility Bed Mobility Overal bed mobility: Needs Assistance Bed Mobility: Supine to Sit;Sit to Supine     Supine to sit: Mod assist;+2 for physical assistance          Transfers Overall transfer level: Needs assistance Equipment used: 2 person hand held assist Transfers: Sit to/from [employer] Sit to Stand: Mod assist;+2 physical assistance   Squat pivot transfers: Mod assist;+2 physical assistance          Balance Overall balance assessment: Needs assistance Sitting-balance support: Feet unsupported;Single extremity supported Sitting balance-Leahy Scale: Good Sitting balance - Comments: Steady static sitting, reaching within BOS.   Standing balance support: During functional activity;Bilateral upper extremity supported Standing balance-Leahy Scale: Fair Standing balance comment: MIN A for static standing.                           ADL either performed or assessed with clinical judgement   ADL Overall ADL's : Needs assistance/impaired     Grooming: Moderate assistance;Cueing for sequencing;Sitting;Brushing hair                                 General ADL Comments: Pt continues to require multimodal cueing to complete BADL tasks. This date, she is able to perform grooming tasks (hair combing and applying lotion) given MOD A and hand over hand prompting to initiate task. Total A to apply lotion to back.     Vision Patient Visual Report: No  change from baseline     Perception     Praxis      Cognition Arousal/Alertness: Lethargic;Awake/alert Behavior During Therapy: WFL for tasks assessed/performed;Flat affect Overall Cognitive Status: Impaired/Different from baseline Area of Impairment: Attention;Following commands;Safety/judgement;Awareness                   Current Attention Level: Alternating Memory: Decreased short-term memory Following  Commands: Follows one step commands inconsistently Safety/Judgement: Decreased awareness of deficits Awareness: Emergent Problem Solving: Slow processing;Decreased initiation;Difficulty sequencing;Requires verbal cues;Requires tactile cues          Exercises Other Exercises Other Exercises: OT facilitates seated grooming tasks at EOB. OT/PT faciltate bed mobility, STS attempts x3, functional transfer to recliner with safety education and multimodal cueing t/o session.   Shoulder Instructions       General Comments      Pertinent Vitals/ Pain       Pain Assessment: No/denies pain  Home Living                                          Prior Functioning/Environment              Frequency  Min 3X/week        Progress Toward Goals  OT Goals(current goals can now be found in the care plan section)  Progress towards OT goals: Progressing toward goals  Acute Rehab OT Goals Patient Stated Goal: to get stronger OT Goal Formulation: With family Time For Goal Achievement: 08/01/20 Potential to Achieve Goals: Fair  Plan Discharge plan remains appropriate;Frequency needs to be updated    Co-evaluation    PT/OT/SLP Co-Evaluation/Treatment: Yes Reason for Co-Treatment: Complexity of the patient's impairments (multi-system involvement);Necessary to address cognition/behavior during functional activity;For patient/therapist safety;To address functional/ADL transfers PT goals addressed during session: Mobility/safety with mobility;Balance;Strengthening/ROM OT goals addressed during session: ADL's and self-care;Proper use of Adaptive equipment and DME      AM-PAC OT "6 Clicks" Daily Activity     Outcome Measure   Help from another person eating meals?: A Lot Help from another person taking care of personal grooming?: A Lot Help from another person toileting, which includes using toliet, bedpan, or urinal?: A Lot Help from another person bathing (including  washing, rinsing, drying)?: A Lot Help from another person to put on and taking off regular upper body clothing?: A Lot Help from another person to put on and taking off regular lower body clothing?: A Lot 6 Click Score: 12    End of Session Equipment Utilized During Treatment: Oxygen (Pt on 1 L [place] t/o session.)  OT Visit Diagnosis: Unsteadiness on feet (R26.81);Muscle weakness (generalized) (M62.81);Other symptoms and signs involving the nervous system (R29.898);Other symptoms and signs involving cognitive function   Activity Tolerance Patient tolerated treatment well   Patient Left in bed;with call bell/phone within reach;with bed alarm set;with family/visitor present   Nurse Communication Mobility status;Other (comment) (Pt up in recliner.)        Time: 8144-8185 OT Time Calculation (min): 26 min  Charges: OT General Charges $OT Visit: 1 Visit OT Treatments $Self Care/Home Management : 8-22 mins  Rockney Ghee, M.S., OTR/L Ascom: [phone] 07/22/20, 2:50 PM

## 2020-07-22 NOTE — Progress Notes (Signed)
Progress Note  Patient Name: Brandi Holland Date of Encounter: 07/22/2020  Primary Cardiologist: Dr. Kirke Corin  Subjective   The patient was very agitated overnight and pulled her IV and was not following commands.  She required Ativan.  She is sleeping this morning and is not arousable.  Inpatient Medications    Scheduled Meds: .  stroke: mapping our early stages of recovery book   Does not apply Once  . budesonide (PULMICORT) nebulizer solution  0.5 mg Nebulization BID  . Chlorhexidine Gluconate Cloth  6 each Topical Q0600  . feeding supplement  237 mL Oral BID BM  . folic acid  1 mg Oral Daily  . furosemide  40 mg Oral Daily  . ipratropium-albuterol  3 mL Nebulization Q6H  . mouth rinse  15 mL Mouth Rinse BID  . nicotine  21 mg Transdermal Daily  . predniSONE  60 mg Oral Q breakfast  . sodium chloride flush  3 mL Intravenous Q12H   Continuous Infusions: . sodium chloride 20 mL/hr at 07/22/20 0634  . albumin human 25 g (07/21/20 1052)   PRN Meds: acetaminophen **OR** acetaminophen, bisacodyl, ipratropium-albuterol, LORazepam   Vital Signs    Vitals:   07/21/20 2132 07/22/20 0132 07/22/20 0500 07/22/20 0730  BP:      Pulse: 99 95  82  Resp: 20   14  Temp:    97.6 F (36.4 C)  TempSrc:    Axillary  SpO2: 94% 100%  99%  Weight:   91.7 kg   Height:        Intake/Output Summary (Last 24 hours) at 07/22/2020 0824 Last data filed at 07/21/2020 2200 Gross per 24 hour  Intake 100 ml  Output 975 ml  Net -875 ml   Last 3 Weights 07/22/2020 07/21/2020 07/19/2020  Weight (lbs) 202 lb 2.6 oz 202 lb 13.2 oz 199 lb 1.2 oz  Weight (kg) 91.7 kg 92 kg 90.3 kg      Telemetry    NSR, 90s-100s with no significant arrhythmia- Personally Reviewed  ECG    No new tracings - Personally Reviewed  Physical Exam   Vital Signs. BP 110/62 (BP Location: Right Arm)   Pulse 82   Temp 97.6 F (36.4 C) (Axillary)   Resp 14   Ht 4\' 9"  (1.448 m)   Wt 91.7 kg   SpO2 99%   BMI 43.75 kg/m   General: Obese female, no acute distress, sleeping deeply and not arousable in spite of multiple attempts. Head: Normocephalic, atraumatic, sclera non-icteric, no xanthomas, nares are without discharge.  Hard of hearing. Neck: No JVD Lungs: Rhonchi, cleared with cough, otherwise clear bilaterally to auscultation though reduced bilaterally and with some work of breathing noted  Heart: RRR S1 S2 without murmurs, rubs, or gallops.  Abdomen: Soft, non-tender, non-distended with normoactive bowel sounds. No rebound/guarding. Extremities: No clubbing or cyanosis. No edema. Distal pedal pulses are 2+ and equal bilaterally. Neuro: Not able to evaluate today. Psych: Extreme agitation overnight and seems to be resting this morning.  Labs    High Sensitivity Troponin:   Recent Labs  Lab 07/16/20 2016 07/17/20 0534 07/17/20 0901 07/17/20 1227  TROPONINIHS 708* 869* 846* 904*      Chemistry Recent Labs  Lab 07/16/20 2016 07/17/20 0534 07/18/20 0115 07/18/20 1216 07/19/20 0345 07/20/20 0420 07/21/20 0411 07/22/20 0411  NA 130* 130* 134*  --  135 136 135 134*  K 4.3 4.5 3.7  --  3.0* 4.0 3.8 3.7  CL  93* 96* 100  --  103 102 99 101  CO2 22 21* 21*  --  24 27 27 26   GLUCOSE 123* 148* 120*  --  100* 122* 174* 105*  BUN 70* 73* 72*  --  44* 40* 38* 39*  CREATININE 3.15* 2.99* 2.41*  --  1.38* 1.14* 1.03* 1.04*  CALCIUM 8.8* 8.6* 8.9  --  8.3* 8.5* 8.9 9.1  PROT 7.0 6.7  --   --   --   --   --   --   ALBUMIN 2.8* 2.7* 2.9*  --  2.3* 2.5*  --   --   AST 30 25  --   --   --   --   --   --   ALT 12 11  --   --   --   --   --   --   ALKPHOS 64 57  --   --   --   --   --   --   BILITOT 2.6* 2.5*  --  2.6*  --   --   --   --   GFRNONAA 17* 18* 23*  --  45* 56* >60 >60  ANIONGAP 15 13 13   --  8 7 9 7      Hematology Recent Labs  Lab 07/20/20 0420 07/21/20 0411 07/22/20 0411  WBC 5.3 5.4 6.9  RBC 2.77* 2.88* 2.81*  HGB 8.5* 9.0* 8.7*  HCT 27.5* 28.4* 28.5*  MCV 99.3 98.6 101.4*   MCH 30.7 31.3 31.0  MCHC 30.9 31.7 30.5  RDW 21.6* 21.9* 22.9*  PLT 56* 67* 78*    BNP Recent Labs  Lab 07/17/20 0025 07/17/20 0534  BNP 1,768.8* 1,893.4*     DDimer  Recent Labs  Lab 07/16/20 2016  DDIMER 2.60*     Radiology    No results found.  Cardiac Studies   2D echo 07/17/2020: 1. Left ventricular ejection fraction, by estimation, is 50 to 55%. The  left ventricle has low normal function. The left ventricle has no regional  wall motion abnormalities. Left ventricular diastolic parameters are  indeterminate.  2. Right ventricular systolic function is normal. The right ventricular  size is normal. Tricuspid regurgitation signal is inadequate for assessing  PA pressure.  3. Left atrial size was mildly dilated.  4. Right atrial size was mildly dilated.  5. The mitral valve is normal in structure. No evidence of mitral valve  regurgitation. No evidence of mitral stenosis.  6. The aortic valve is normal in structure. Aortic valve regurgitation is  mild. Mild aortic valve stenosis.  7. The inferior vena cava is dilated in size with <50% respiratory  variability, suggesting right atrial pressure of 15 mmHg.   Patient Profile     55 y.o. female with history of fibromyalgia, peripheral nephropathy, asthma, arthritis, ongoing tobacco use, OSA, and admitted 6/2 with acute respiratory failure secondary to acute exacerbation of COPD and acute on chronic HFpEF complicated by frontal lobe CVA, ARF, and severe symptomatic anemia, and who is being evaluated this admission in the setting of elevated troponin, elevated BNP, and acute CVA.  Assessment & Plan    Acute frontal lobe CVA -- Multiple vascular territories concerning for cardiac or aortic embolic etiology.  No evidence of atrial fibrillation on telemetry.    Recent TTE with EF 50 to 55%, NR WMA, mild biatrial enlargement, RAP 15 mmHg.  The plan was to proceed with TEE this morning.  However, the patient is  not  arousable and she had extreme agitation overnight.  We will not proceed today and will try again for tomorrow.  Acute on chronic diastolic heart failure:  Recent diagnosis at [facility].  She has been diuresed with IV furosemide 40 mg daily and currently appears to be euvolemic.  I changed to 40 mg by mouth once daily.  Heart failure exacerbation was in the setting of severe anemia.  Hearing impairment -- The patient reports inability to afford hearing aids.  Elevated troponin -- Likely supply demand ischemia.  Recommend Lexiscan Myoview as an outpatient.  Severe symptomatic anemia -- Improved with transfusion.  Acute renal failure -- Improved with diuresis and current GFR is greater than 60.  Acute hypoxic respiratory failure -- Improved overall.  Severe encephalopathy -- This is likely sundowning especially with recent stroke.  For questions or updates, please contact [facility] Please consult [email or web address] for contact info under        Signed, Lorine Bears, MD  07/22/2020, 8:24 AM
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
## 2020-07-23 ENCOUNTER — Inpatient Hospital Stay
Admit: 2020-07-23 | Discharge: 2020-07-23 | Disposition: A | Discharge status: Home / Self Care | Payer: Medicaid Other | Attending: Physician Assistant | Admitting: Physician Assistant

## 2020-07-23 ENCOUNTER — Encounter
Admission: EM | Disposition: A | Discharge status: Home Health | Payer: Self-pay | Source: Home / Self Care | Attending: Internal Medicine

## 2020-07-23 DIAGNOSIS — I340 Nonrheumatic mitral (valve) insufficiency: Secondary | ICD-10-CM

## 2020-07-23 DIAGNOSIS — I351 Nonrheumatic aortic (valve) insufficiency: Secondary | ICD-10-CM

## 2020-07-23 DIAGNOSIS — R0602 Shortness of breath: Secondary | ICD-10-CM

## 2020-07-23 HISTORY — PX: TEE WITHOUT CARDIOVERSION: SHX5443

## 2020-07-23 LAB — CBC WITH DIFFERENTIAL/PLATELET
Abs Immature Granulocytes: 0.1 10*3/uL — ABNORMAL HIGH (ref 0.00–0.07)
Basophils Absolute: 0 10*3/uL (ref 0.0–0.1)
Basophils Relative: 0 %
Eosinophils Absolute: 0 10*3/uL (ref 0.0–0.5)
Eosinophils Relative: 0 %
HCT: 28.2 % — ABNORMAL LOW (ref 36.0–46.0)
Hemoglobin: 8.8 g/dL — ABNORMAL LOW (ref 12.0–15.0)
Immature Granulocytes: 1 %
Lymphocytes Relative: 16 %
Lymphs Abs: 1.2 10*3/uL (ref 0.7–4.0)
MCH: 31.3 pg (ref 26.0–34.0)
MCHC: 31.2 g/dL (ref 30.0–36.0)
MCV: 100.4 fL — ABNORMAL HIGH (ref 80.0–100.0)
Monocytes Absolute: 0.6 10*3/uL (ref 0.1–1.0)
Monocytes Relative: 7 %
Neutro Abs: 6 10*3/uL (ref 1.7–7.7)
Neutrophils Relative %: 76 %
Platelets: 97 10*3/uL — ABNORMAL LOW (ref 150–400)
RBC: 2.81 MIL/uL — ABNORMAL LOW (ref 3.87–5.11)
RDW: 22.3 % — ABNORMAL HIGH (ref 11.5–15.5)
Smear Review: NORMAL
WBC: 7.9 10*3/uL (ref 4.0–10.5)
nRBC: 0 % (ref 0.0–0.2)

## 2020-07-23 LAB — BASIC METABOLIC PANEL
Anion gap: 10 (ref 5–15)
BUN: 41 mg/dL — ABNORMAL HIGH (ref 6–20)
CO2: 27 mmol/L (ref 22–32)
Calcium: 9.5 mg/dL (ref 8.9–10.3)
Chloride: 101 mmol/L (ref 98–111)
Creatinine, Ser: 0.84 mg/dL (ref 0.44–1.00)
GFR, Estimated: >60 mL/min (ref >60)
Glucose, Bld: 123 mg/dL — ABNORMAL HIGH (ref 70–99)
Potassium: 4 mmol/L (ref 3.5–5.1)
Sodium: 138 mmol/L (ref 135–145)

## 2020-07-23 LAB — COMP PANEL: LEUKEMIA/LYMPHOMA

## 2020-07-23 LAB — CULTURE, BLOOD (ROUTINE X 2)
Culture: NO GROWTH
Culture: NO GROWTH
Special Requests: ADEQUATE
Special Requests: ADEQUATE

## 2020-07-23 SURGERY — ECHOCARDIOGRAM, TRANSESOPHAGEAL
Anesthesia: Moderate Sedation

## 2020-07-23 MED ORDER — MIDAZOLAM HCL 5 MG/5ML IJ SOLN
INTRAMUSCULAR | Status: AC
  Filled 2020-07-23: qty 5

## 2020-07-23 MED ORDER — SODIUM CHLORIDE FLUSH 0.9 % IV SOLN
INTRAVENOUS | Status: AC
  Filled 2020-07-23: qty 10

## 2020-07-23 MED ORDER — MIDAZOLAM HCL 5 MG/5ML IJ SOLN
INTRAMUSCULAR | Status: AC | PRN
  Administered 2020-07-23 (×3): 1 mg via INTRAVENOUS

## 2020-07-23 MED ORDER — FENTANYL CITRATE (PF) 100 MCG/2ML IJ SOLN
INTRAMUSCULAR | Status: AC | PRN
  Administered 2020-07-23 (×2): 25 ug via INTRAVENOUS

## 2020-07-23 MED ORDER — FENTANYL CITRATE (PF) 100 MCG/2ML IJ SOLN
INTRAMUSCULAR | Status: AC
  Filled 2020-07-23: qty 2

## 2020-07-23 MED ORDER — LIDOCAINE VISCOUS HCL 2 % MT SOLN
OROMUCOSAL | Status: AC | PRN
  Administered 2020-07-23: 15 mL via OROMUCOSAL

## 2020-07-23 MED ORDER — PREDNISONE 20 MG PO TABS
40.0000 mg | ORAL_TABLET | Freq: Every day | ORAL | Status: DC
Start: 2020-07-24 — End: 2020-07-26
  Administered 2020-07-24 – 2020-07-26 (×3): 40 mg via ORAL
  Filled 2020-07-23 (×3): qty 2

## 2020-07-23 MED ORDER — LIDOCAINE VISCOUS HCL 2 % MT SOLN
OROMUCOSAL | Status: AC
  Filled 2020-07-23: qty 15

## 2020-07-23 NOTE — Progress Notes (Signed)
PT Cancellation Note  Patient Details Name: Brandi Holland MRN: [id number] DOB: 1965-05-18   Cancelled Treatment:    Reason Eval/Treat Not Completed: Other (comment)  Pt currently off unit for TEE. Will follow up at another time.  Katlyne Nishida 07/23/2020, 1:31 PM Elizabeth Palau, PT, DPT [phone]

## 2020-07-23 NOTE — Progress Notes (Signed)
OT Cancellation Note  Patient Details Name: Brandi Holland MRN: [id number] DOB: November 23, 1965   Cancelled Treatment:    Reason Eval/Treat Not Completed: Patient at procedure or test/ unavailable. Pt unavailable for OT tx session this pm. Per chart, currently OTF for TEE. Will re-attempt at a later date/time as available and pt medically appropriate for therapy session.   Rockney Ghee, M.S., OTR/L Ascom: [phone] 07/23/20, 1:13 PM

## 2020-07-23 NOTE — Progress Notes (Signed)
PROGRESS NOTE    KALISHA KEADLE  ZWC:[id number] DOB: 27-Mar-1965 DOA: 07/16/2020 PCP: Pcp, No   Brief Narrative: Taken from H&P.  Brandi Holland is a 55 y.o. female with medical history significant of asthma, OSA, fibromyalgia who presents via EMS for shortness of breath.  She was hypoxic in 60s requiring 5 to 6 L of oxygen with intermittent nonrebreather. Initially admitting provider unable to obtained any history as there was no family member at bedside, no one could be reached on phone and patient was very altered.  Able to get hold of husband after multiple attempts at bedside, according to him patient was not feeling well for the past few weeks, she was admitted at [facility] on 06/18/2020 with lower extremity edema and was diuresed and discharged on 06/20/2020.  No echocardiogram done, CTA was negative for PE at that time, no significant abnormality on labs except sodium of 122 which improved to 130 and mild AKI, unknown baseline. Patient continued to feel bad and she was staying in bed for the past more than 2 weeks, unable to get out of bed, very poor p.o. intake.  Per husband she did had diarrhea with black color stool and he has seen some blood in stool last week, diarrhea lasted for 2 days and then resolved.  He did not noticed any hematemesis.  Patient does not drink alcohol but is a heavy smoker, smokes at least 2 packs/day.  Husband was able to brought her to ED 2 weeks ago and then she left after waiting couple of hours without being seen.  Per husband she was refusing to go see a doctor, when she continued to deteriorate he asked her son who lives in [place] who drove from there and talked with her and called the EMS. Husband denies any recent upper respiratory illnesses, fever or chills.  He did noticed worsening shortness of breath.  Patient was mostly bedbound for the past 2 weeks, he was using diapers to help her as she was unable to go to the bathroom by herself due to profound  weakness.  Patient remained very altered with intermittent desaturation, ABG with some respiratory alkalosis, no hypoxia.1/2 blood culture bottles with MSSE, MRI brain with multiple bilateral infarcts, worsening renal function so unable to obtain CTA, VQ scan ordered but got canceled due to recurrent desaturation.  D-dimer at 2.6, hemoglobin of 5.7, platelet counts of 97 this morning and there were 255 during recent hospitalization at [facility], BUN of 73 and creatinine of 2.99, sodium of 130, chloride of 96 and bicarb of 21.  Elevated T bili at 2.5 and INR of 1.5, lactic acidosis 3.2>>2.5>>1.9, elevated troponin 708>>904, BNP at 1893, CT chest and abdomen with bilateral groundglass opacities which can be due to pulmonary edema versus atypical pneumonia and no significant abnormality on CT abdomen.  Echocardiogram with EF of 50 to 55%, which is within lower normal limit, no wall motion abnormalities and indeterminate diastolic parameters, mildly biatrial dilatation and dilated inferior vena cava with less than 50% variability. Received total of 3 units PRBC 6/3:Had swallow evaluation today and they are recommending dysphagia 2 diet. 6/4: Cardiology started her on diuresis with IV Lasix 40 mg daily, some improvement in breathing status. 6/5: Significant improvement in breathing status, worsening thrombocytopenia, started on prednisone 6/6: No new complaint, platelets started improving. 6/7: TEE got canceled again as patient was difficult to arouse this morning, apparently was very agitated overnight and received a dose of Ativan, we will attempt again tomorrow.  6/8-Plan for TEE today. Per husband her MS is improving. Pt hard of hearing.  Subjective: Pt awake, interactive when asking questions as long as she can hear me. Husband at bedside. MS improving per husband. SOB less. No cp.   Assessment & Plan:   Principal Problem:   Acute encephalopathy Active Problems:   CVA (cerebral vascular accident)  (HCC)   Asthma   Acute respiratory failure with hypoxia (HCC)   AKI (acute kidney injury) (HCC)   Hyponatremia   OSA (obstructive sleep apnea)   AMS (altered mental status)   Thrombocytopenia (HCC)   Acute on chronic diastolic heart failure (HCC)   Folate deficiency   Hyperbilirubinemia  Severe acute encephalopathy.  Resolved  Unknown etiology and can be multifactorial with bilateral strokes. No obvious source of infection except 1 out of 2 blood culture with MSSE which is most likely a contaminant, procalcitonin at 0.35>>0.27, concern of cardiac embolic source which can be due to endocarditis but less likely with negative blood cultures . 6/8-per husband pt MS doing better.  Continue to monitor  Acute hypoxic respiratory failure.  Improving, currently on 2 L of oxygen  Patient has an history of heavy smoking but no underlying diagnosis of COPD, not on home oxygen.  Initially required nonrebreather and then later switched to heated high flow. Might be due to encephalopathy versus pulmonary edema.  Elevated D-dimer but unable to obtain CTA due to AKI, VQ scan got canceled due to recurrent desaturation. Cardiology started her on IV Lasix 5/8- improving. Less sob. Continue with lasix po.  Keep 02 sat >92%  Severe anemia/thrombocytopenia/concern of hemolytic anemia.  Hemoglobin and platelet started improving, she was started on prednisone with improving of platelets slowly. 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  Might have GI bleed as husband was mentioning about dark color stools while she was having diarrhea for 2 days last week.  No recurrence at this time. Anemia panel with iron and folate deficiency.  Patient received 1 unit while in the ED, and 2 L next day. Received IV iron X 2 doses.  Hematology is involved-appreciate their help.  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   Rest of the labs pending. -Continue prednisone 60 mg daily-hematology to determine the duration.  CT abdomen with no concern of cirrhosis. 6/8-hemoglobin stable.  Platelets improving slowly.  Today 44 We will ask oncology's about duration  of prednisone treatment   HFpEF/Elevated troponin.  No prior history of heart failure. EF 50-55% on echo with diastolic dysfunction Cardiology following Continue lasix po I/o    Bilateral multifocal acute cerebral infarct.  Most likely secondary to cardiac emboli, TTE without any obvious source. MRA without any significant stenosis Extremity Doppler scans were negative for any DVT, 1 small acute superficial thrombophlebitis in upper extremity. Neurology following No anticoagulation at this time due to severe anemia and thrombocytopenia. 6/8-plan for TEE today to r/o cardiac/embolic sourse  Hypophosphatemia. Replaced and resolved   Hypoalbuminemia.  Causing some third spacing. Received albumin for hypoalbuminemia and to improve diuresis and third spacing. Albumin was d/c'd.     1/2 blood cultures with MSSE.  Most likely a contaminant.  Patient is afebrile with no leukocytosis, borderline procalcitonin.  Patient received 1 dose of cefepime and 2 doses of vancomycin earlier during admission.  And received 5 days of cefazolin.  No need for more antibiotics at this time. -Repeat blood culture -negative. -TTE without any significant  abnormality 6/8-ID felt the staph epidermidis and staph capitis in 1 set of bcx are contaminents and to dc abx.  Hyponatremia.  Resolved Sodium was 122 during recent admission at [facility] earlier in May  Most likely secondary to poor p.o. intake and volume overload.    AKI.  Improving  Unknown baseline, continue to improve, at 1.04 today.  It was 1.2 during her recent admission at [facility].  Renal ultrasound without any significant abnormality.  Foley was placed for strict intake and output. -Monitor renal function -Avoid  nephrotoxins  Stage III obesity. Estimated body mass index is 43.75 kg/m as calculated from the following:   Height as of this encounter:  (1.448 m).   Weight as of this encounter: 91.7 kg.  This will complicate overall prognosis.  Objective: Vitals:   07/22/20 2151 07/23/20 0016 07/23/20 0333 07/23/20 0721  BP: (!) 120/55 (!) 125/55 122/71 110/63  Pulse: 68 98 93 90  Resp: Temp: 97.8 F (36.6 C) 98.1 F (36.7 C) (!) 97.5 F (36.4 C) 98.3 F (36.8 C)  TempSrc: Oral  Oral   SpO2: 94%  98% 100%  Weight:      Height:        Intake/Output Summary (Last 24 hours) at 07/23/2020 0806 Last data filed at 07/22/2020 1700 Gross per 24 hour  Intake 240 ml  Output 150 ml  Net 90 ml   Filed Weights   07/19/20 0500 07/21/20 0500 07/22/20 0500  Weight: 90.3 kg 92 kg 91.7 kg    Examination: Nad, quiet, interactive with exam when asking questions Decrease bs , no wheezing Regular s1/s2 no gallop Soft benign +bs No edema Awake and alert, oriented x3 Mood and affect appropriate in current setting  DVT prophylaxis: SCDs, patient has severe anemia and thrombocytopenia. Code Status: Full Family Communication: Discussed with husband at bedside.  Status is: Inpatient  Remains inpatient appropriate because:Inpatient level of care appropriate due to severity of illness   Dispo: The patient is from: Home              Anticipated d/c is to: To be determined              Patient currently is not medically stable to d/c.   Difficult to place patient No               Level of care: Med-Surg  Consultants:   Neurology  Cardiology  ID  Hematology  Procedures:  Antimicrobials:   Data Reviewed: I have personally reviewed following labs and imaging studies  CBC: Recent Labs  Lab 07/18/20 0115 07/18/20 0555 07/19/20 0345 07/20/20 0420 07/21/20 0411 07/22/20 0411 07/23/20 0549  WBC 7.6   < > 5.4 5.3 5.4 6.9 7.9  NEUTROABS 5.7  --  3.6 3.1  --  3.4 6.0   HGB 8.1*   < > 8.0* 8.5* 9.0* 8.7* 8.8*  HCT 24.9*   < > 25.0* 27.5* 28.4* 28.5* 28.2*  MCV 94.7   < > 95.1 99.3 98.6 101.4* 100.4*  PLT 79*   < > 61* 56* 67* 78* 97*   < > = values in this interval not displayed.   Basic Metabolic Panel: Recent Labs  Lab 07/18/20 0115 07/19/20 0345 07/20/20 0420 07/21/20 0411 07/22/20 0411 07/23/20 0549  NA 134* 135 136 135 134* 138  K 3.7 3.0* 4.0 3.8 3.7 4.0  CL 100 103 102 99 101 101  CO2 21* 27  GLUCOSE 120* 100* 122* 174* 105* 123*  BUN 72* 44* 40* 38* 39* 41*  CREATININE 2.41* 1.38* 1.14* 1.03* 1.04* 0.84  CALCIUM 8.9 8.3* 8.5* 8.9 9.1 9.5  MG  --  2.2  --   --   --   --   PHOS 5.1* 2.5 1.8*  --  3.2  --    GFR: Estimated Creatinine Clearance: 70.6 mL/min (by C-G formula based on SCr of 0.84 mg/dL). Liver Function Tests: Recent Labs  Lab 07/16/20 2016 07/17/20 0534 07/18/20 0115 07/18/20 1216 07/19/20 0345 07/20/20 0420  AST 30 25  --   --   --   --   ALT 12 11  --   --   --   --   ALKPHOS 64 57  --   --   --   --   BILITOT 2.6* 2.5*  --  2.6*  --   --   PROT 7.0 6.7  --   --   --   --   ALBUMIN 2.8* 2.7* 2.9*  --  2.3* 2.5*   Recent Labs  Lab 07/16/20 2016  LIPASE 32   Recent Labs  Lab 07/18/20 0837  AMMONIA 17   Coagulation Profile: Recent Labs  Lab 07/17/20 0018 07/17/20 0534 07/20/20 1106  INR 1.5* 1.5* 1.2   Cardiac Enzymes: Recent Labs  Lab 07/16/20 2016  CKTOTAL 266*   BNP (last 3 results) No results for input(s): PROBNP in the last 8760 hours. HbA1C: No results for input(s): HGBA1C in the last 72 hours. CBG: Recent Labs  Lab 07/18/20 0022  GLUCAP 112*   Lipid Profile: No results for input(s): CHOL, HDL, LDLCALC, TRIG, CHOLHDL, LDLDIRECT in the last 72 hours. Thyroid Function Tests: No results for input(s): TSH, T4TOTAL, FREET4, T3FREE, THYROIDAB in the last 72 hours. Anemia Panel: No results for input(s): VITAMINB12, FOLATE, FERRITIN, TIBC, IRON, RETICCTPCT in the last 72  hours. Sepsis Labs: Recent Labs  Lab 07/16/20 2016 07/17/20 0534 07/17/20 0901 07/17/20 1227 07/18/20 0115  PROCALCITON 0.35 0.34  --   --  0.27  LATICACIDVEN 3.2*  --  2.5* 1.9  --     Recent Results (from the past 240 hour(s))  Blood culture (single)     Status: Abnormal   Collection Time: 07/16/20  8:07 PM   Specimen: BLOOD  Result Value Ref Range Status   Specimen Description   Final    BLOOD LEFT ANTECUBITAL Performed at [facility], [address], [place], [place] [address]    Special Requests   Final    BOTTLES DRAWN AEROBIC AND ANAEROBIC Blood Culture results may not be optimal due to an inadequate volume of blood received in culture bottles Performed at [facility], [address] [place], [place] [address]    Culture  Setup Time   Final    GRAM POSITIVE COCCI IN BOTH AEROBIC AND ANAEROBIC BOTTLES CRITICAL RESULT CALLED TO, READ BACK BY AND VERIFIED WITH: DEVIN MITCHELL 1200 07/17/2020 DLB    Culture (A)  Final    STAPHYLOCOCCUS EPIDERMIDIS STAPHYLOCOCCUS CAPITIS THE SIGNIFICANCE OF ISOLATING THIS ORGANISM FROM A SINGLE SET OF BLOOD CULTURES WHEN MULTIPLE SETS ARE DRAWN IS UNCERTAIN. PLEASE NOTIFY THE MICROBIOLOGY DEPARTMENT WITHIN ONE WEEK IF SPECIATION AND SENSITIVITIES ARE REQUIRED. Performed at [facility], [address], [place], [place] [address]    Report Status 07/19/2020 FINAL  Final  Blood Culture ID Panel (Reflexed)     Status: Abnormal   Collection  Time: 07/16/20  8:07 PM  Result Value Ref Range Status   Enterococcus faecalis NOT DETECTED NOT DETECTED Final   Enterococcus Faecium NOT DETECTED NOT DETECTED Final   Listeria monocytogenes NOT DETECTED NOT DETECTED Final   Staphylococcus species DETECTED (A) NOT DETECTED Final    Comment: CRITICAL RESULT CALLED TO, READ BACK BY AND VERIFIED WITH: DEVIN MITCHELL 1200 07/17/2020 DLB    Staphylococcus aureus (BCID) NOT DETECTED NOT DETECTED Final   Staphylococcus epidermidis  DETECTED (A) NOT DETECTED Final    Comment: CRITICAL RESULT CALLED TO, READ BACK BY AND VERIFIED WITH: DEVIN MITCHELL 1200 07/17/2020 DLB    Staphylococcus lugdunensis NOT DETECTED NOT DETECTED Final   Streptococcus species NOT DETECTED NOT DETECTED Final   Streptococcus agalactiae NOT DETECTED NOT DETECTED Final   Streptococcus pneumoniae NOT DETECTED NOT DETECTED Final   Streptococcus pyogenes NOT DETECTED NOT DETECTED Final   A.calcoaceticus-baumannii NOT DETECTED NOT DETECTED Final   Bacteroides fragilis NOT DETECTED NOT DETECTED Final   Enterobacterales NOT DETECTED NOT DETECTED Final   Enterobacter cloacae complex NOT DETECTED NOT DETECTED Final   Escherichia coli NOT DETECTED NOT DETECTED Final   Klebsiella aerogenes NOT DETECTED NOT DETECTED Final   Klebsiella oxytoca NOT DETECTED NOT DETECTED Final   Klebsiella pneumoniae NOT DETECTED NOT DETECTED Final   Proteus species NOT DETECTED NOT DETECTED Final   Salmonella species NOT DETECTED NOT DETECTED Final   Serratia marcescens NOT DETECTED NOT DETECTED Final   Haemophilus influenzae NOT DETECTED NOT DETECTED Final   Neisseria meningitidis NOT DETECTED NOT DETECTED Final   Pseudomonas aeruginosa NOT DETECTED NOT DETECTED Final   Stenotrophomonas maltophilia NOT DETECTED NOT DETECTED Final   Candida albicans NOT DETECTED NOT DETECTED Final   Candida auris NOT DETECTED NOT DETECTED Final   Candida glabrata NOT DETECTED NOT DETECTED Final   Candida krusei NOT DETECTED NOT DETECTED Final   Candida parapsilosis NOT DETECTED NOT DETECTED Final   Candida tropicalis NOT DETECTED NOT DETECTED Final   Cryptococcus neoformans/gattii NOT DETECTED NOT DETECTED Final   Methicillin resistance mecA/C NOT DETECTED NOT DETECTED Final    Comment: Performed at [facility], [address] [place], [place] [address]  Resp Panel by RT-PCR (Flu A&B, Covid) Nasopharyngeal Swab     Status: None   Collection Time: 07/16/20  8:16 PM    Specimen: Nasopharyngeal Swab; Nasopharyngeal(NP) swabs in vial transport medium  Result Value Ref Range Status   SARS Coronavirus 2 by RT PCR NEGATIVE NEGATIVE Final    Comment: (NOTE) SARS-CoV-2 target nucleic acids are NOT DETECTED.  The SARS-CoV-2 RNA is generally detectable in upper respiratory specimens during the acute phase of infection. The lowest concentration of SARS-CoV-2 viral copies this assay can detect is 138 copies/mL. A negative result does not preclude SARS-Cov-2 infection and should not be used as the sole basis for treatment or other patient management decisions. A negative result may occur with  improper specimen collection/handling, submission of specimen other than nasopharyngeal swab, presence of viral mutation(s) within the areas targeted by this assay, and inadequate number of viral copies(<138 copies/mL). A negative result must be combined with clinical observations, patient history, and epidemiological information. The expected result is Negative.  Fact Sheet for Patients:  BloggerCourse.com  Fact Sheet for Healthcare Providers:  SeriousBroker.it  This test is no t yet approved or cleared by the [place] FDA and  has been authorized for detection and/or diagnosis of SARS-CoV-2 by FDA under an Emergency Use Authorization (EUA). This EUA  will remain  in effect (meaning this test can be used) for the duration of the COVID-19 declaration under Section 564(b)(1) of the Act, 21 U.S.C.section 360bbb-3(b)(1), unless the authorization is terminated  or revoked sooner.       Influenza A by PCR NEGATIVE NEGATIVE Final   Influenza B by PCR NEGATIVE NEGATIVE Final    Comment: (NOTE) The Xpert Xpress SARS-CoV-2/FLU/RSV plus assay is intended as an aid in the diagnosis of influenza from Nasopharyngeal swab specimens and should not be used as a sole basis for treatment. Nasal washings and aspirates are  unacceptable for Xpert Xpress SARS-CoV-2/FLU/RSV testing.  Fact Sheet for Patients: BloggerCourse.comhttps://www.fda.gov/media/152166/download  Fact Sheet for Healthcare Providers: SeriousBroker.ithttps://www.fda.gov/media/152162/download  This test is not yet approved or cleared by the [place] FDA and has been authorized for detection and/or diagnosis of SARS-CoV-2 by FDA under an Emergency Use Authorization (EUA). This EUA will remain in effect (meaning this test can be used) for the duration of the COVID-19 declaration under Section 564(b)(1) of the Act, 21 U.S.C. section 360bbb-3(b)(1), unless the authorization is terminated or revoked.  Performed at [facility], [address] [place], [place] [address]   MRSA PCR Screening     Status: None   Collection Time: 07/18/20 12:38 AM   Specimen: Nasal Mucosa; Nasopharyngeal  Result Value Ref Range Status   MRSA by PCR NEGATIVE NEGATIVE Final    Comment:        The GeneXpert MRSA Assay (FDA approved for NASAL specimens only), is one component of a comprehensive MRSA colonization surveillance program. It is not intended to diagnose MRSA infection nor to guide or monitor treatment for MRSA infections. Performed at [facility], [address] [place], [place] [address]   CULTURE, BLOOD (ROUTINE X 2) w Reflex to ID Panel     Status: None   Collection Time: 07/18/20  3:13 PM   Specimen: BLOOD  Result Value Ref Range Status   Specimen Description BLOOD BLOOD RIGHT FOREARM  Final   Special Requests   Final    BOTTLES DRAWN AEROBIC AND ANAEROBIC Blood Culture adequate volume   Culture   Final    NO GROWTH 5 DAYS Performed at [facility], [address] [place], [place] [address]    Report Status 07/23/2020 FINAL  Final  CULTURE, BLOOD (ROUTINE X 2) w Reflex to ID Panel     Status: None   Collection Time: 07/18/20  3:17 PM   Specimen: BLOOD  Result Value Ref Range Status   Specimen Description BLOOD BLOOD RIGHT HAND   Final   Special Requests   Final    BOTTLES DRAWN AEROBIC AND ANAEROBIC Blood Culture adequate volume   Culture   Final    NO GROWTH 5 DAYS Performed at [facility], [address] [place], [place] [address]    Report Status 07/23/2020 FINAL  Final     Radiology Studies: No results found.  Scheduled Meds: .  stroke: mapping our early stages of recovery book   Does not apply Once  . budesonide (PULMICORT) nebulizer solution  0.5 mg Nebulization BID  . Chlorhexidine Gluconate Cloth  6 each Topical Q0600  . feeding supplement  237 mL Oral BID BM  . folic acid  1 mg Oral Daily  . furosemide  40 mg Oral Daily  . ipratropium-albuterol  3 mL Nebulization Q6H  . mouth rinse  15 mL Mouth Rinse BID  . nicotine  21 mg Transdermal Daily  .  predniSONE  60 mg Oral Q breakfast  . sodium chloride flush  3 mL Intravenous Q12H   Continuous Infusions: . sodium chloride 20 mL/hr at 07/23/20 0114  . sodium chloride       LOS: 6 days   Time spent: 30 minutes with >50% on coc  Lynn Ito, MD [facility]  If 7PM-7AM, please contact night-coverage [email or web address]  07/23/2020, 8:06 AM

## 2020-07-23 NOTE — Progress Notes (Signed)
Progress Note  Patient Name: Brandi Holland Date of Encounter: 07/23/2020  [facility] Cardiologist: Dr. Kirke Corin  Subjective   Still with shortness of breath although improved.  Has some difficulty with left arm movements.  Husband at bedside.  Satting well on nasal cannula.  Inpatient Medications    Scheduled Meds: .  stroke: mapping our early stages of recovery book   Does not apply Once  . budesonide (PULMICORT) nebulizer solution  0.5 mg Nebulization BID  . Chlorhexidine Gluconate Cloth  6 each Topical Q0600  . feeding supplement  237 mL Oral BID BM  . folic acid  1 mg Oral Daily  . furosemide  40 mg Oral Daily  . ipratropium-albuterol  3 mL Nebulization Q6H  . mouth rinse  15 mL Mouth Rinse BID  . nicotine  21 mg Transdermal Daily  . predniSONE  60 mg Oral Q breakfast  . sodium chloride flush  3 mL Intravenous Q12H   Continuous Infusions: . sodium chloride 20 mL/hr at 07/23/20 0114  . sodium chloride     PRN Meds: acetaminophen **OR** acetaminophen, bisacodyl, ipratropium-albuterol, LORazepam   Vital Signs    Vitals:   07/22/20 2151 07/23/20 0016 07/23/20 0333 07/23/20 0721  BP: (!) 120/55 (!) 125/55 122/71 110/63  Pulse: 68 98 93 90  Resp: 20 18 19 17   Temp: 97.8 F (36.6 C) 98.1 F (36.7 C) (!) 97.5 F (36.4 C) 98.3 F (36.8 C)  TempSrc: Oral  Oral   SpO2: 94%  98% 100%  Weight:      Height:        Intake/Output Summary (Last 24 hours) at 07/23/2020 0903 Last data filed at 07/22/2020 1700 Gross per 24 hour  Intake 240 ml  Output 150 ml  Net 90 ml   Last 3 Weights 07/22/2020 07/21/2020 07/19/2020  Weight (lbs) 202 lb 2.6 oz 202 lb 13.2 oz 199 lb 1.2 oz  Weight (kg) 91.7 kg 92 kg 90.3 kg      Telemetry    Sinus rhythm- Personally Reviewed  ECG    No new tracing observed- Personally Reviewed  Physical Exam   GEN: No acute distress.   Neck: No JVD Cardiac: RRR, no murmurs  Respiratory:  Diminished breath sounds at bases GI: Soft, nontender,  non-distended  MS: No edema; No deformity. Neuro:   Left arm and left leg weakness noted Psych: Normal affect   Labs    High Sensitivity Troponin:   Recent Labs  Lab 07/16/20 2016 07/17/20 0534 07/17/20 0901 07/17/20 1227  TROPONINIHS 708* 869* 846* 904*      Chemistry Recent Labs  Lab 07/16/20 2016 07/17/20 0534 07/18/20 0115 07/18/20 1216 07/19/20 0345 07/20/20 0420 07/21/20 0411 07/22/20 0411 07/23/20 0549  NA 130* 130* 134*  --  135 136 135 134* 138  K 4.3 4.5 3.7  --  3.0* 4.0 3.8 3.7 4.0  CL 93* 96* 100  --  103 102 99 101 101  CO2 22 21* 21*  --  24 27 27 26 27   GLUCOSE 123* 148* 120*  --  100* 122* 174* 105* 123*  BUN 70* 73* 72*  --  44* 40* 38* 39* 41*  CREATININE 3.15* 2.99* 2.41*  --  1.38* 1.14* 1.03* 1.04* 0.84  CALCIUM 8.8* 8.6* 8.9  --  8.3* 8.5* 8.9 9.1 9.5  PROT 7.0 6.7  --   --   --   --   --   --   --  ALBUMIN 2.8* 2.7* 2.9*  --  2.3* 2.5*  --   --   --   AST 30 25  --   --   --   --   --   --   --   ALT 12 11  --   --   --   --   --   --   --   ALKPHOS 64 57  --   --   --   --   --   --   --   BILITOT 2.6* 2.5*  --  2.6*  --   --   --   --   --   GFRNONAA 17* 18* 23*  --  45* 56* >60 >60 >60  ANIONGAP 15 13 13   --  8 7 9 7 10      Hematology Recent Labs  Lab 07/21/20 0411 07/22/20 0411 07/23/20 0549  WBC 5.4 6.9 7.9  RBC 2.88* 2.81* 2.81*  HGB 9.0* 8.7* 8.8*  HCT 28.4* 28.5* 28.2*  MCV 98.6 101.4* 100.4*  MCH 31.3 31.0 31.3  MCHC 31.7 30.5 31.2  RDW 21.9* 22.9* 22.3*  PLT 67* 78* 97*    BNP Recent Labs  Lab 07/17/20 0025 07/17/20 0534  BNP 1,768.8* 1,893.4*     DDimer  Recent Labs  Lab 07/16/20 2016  DDIMER 2.60*     Radiology    No results found.  Cardiac Studies    Echo 07/17/2020: 1. Left ventricular ejection fraction, by estimation, is 50 to 55%. The  left ventricle has low normal function. The left ventricle has no regional  wall motion abnormalities. Left ventricular diastolic parameters are   indeterminate.  2. Right ventricular systolic function is normal. The right ventricular  size is normal. Tricuspid regurgitation signal is inadequate for assessing  PA pressure.  3. Left atrial size was mildly dilated.  4. Right atrial size was mildly dilated.  5. The mitral valve is normal in structure. No evidence of mitral valve  regurgitation. No evidence of mitral stenosis.  6. The aortic valve is normal in structure. Aortic valve regurgitation is  mild. Mild aortic valve stenosis.  7. The inferior vena cava is dilated in size with <50% respiratory  variability, suggesting right atrial pressure of 15 mmHg.  Patient Profile     55 y.o. female with history of COPD, smoker, OSA, asthma presenting with respiratory failure, diagnosed with CVA.  Assessment & Plan    1.  Acute frontal CVA -No A. fib or flutter noted on telemetry -Transthoracic echo with no evidence of cardioembolic source -Plan for TEE later today.  2.  History of HFpEF -Appears euvolemic -Continue Lasix 40 daily  3.  Respiratory failure, COPD -Overall improved, satting well on nasal cannula -Management as per primary team  Total encounter time more than 35 minutes  Greater than 50% was spent in counseling and coordination of care with the patient        Signed, 09/16/2020, MD  07/23/2020, 9:03 AM

## 2020-07-23 NOTE — Progress Notes (Signed)
*  PRELIMINARY RESULTS* Echocardiogram 2D Echocardiogram has been performed.  Cristela Blue 07/23/2020, 2:13 PM

## 2020-07-23 NOTE — Procedures (Addendum)
Transesophageal Echocardiogram :  Indication:CVA  Procedure: 10cc of viscous lidocaine were given orally to provide local anesthesia to the oropharynx. The patient was positioned supine on the left side, bite block provided. The patient was moderately sedated with the doses of versed and fentanyl as detailed below.  Using digital technique an omniplane probe was advanced into the esophagus without incident.   Moderate sedation: 1. Sedation used:  Versed: 3mg , Fentanyl: 2. Time administered:  13:33   Time when patient started recovery: 14:01 3. I was face to face during this time 28 minutes  See report in EPIC  for complete details: In brief, imaging revealed normal LV function with no RWMAs and no mural apical thrombus.  .  Estimated ejection fraction was 55%.  Right sided cardiac chambers were normal with no evidence of pulmonary hypertension.  Imaging of the septum showed no ASD or VSD Bubble study was negative for shunt 2D and color flow confirmed no PFO  The LA was well visualized in orthogonal views.  There was no spontaneous contrast and no thrombus in the LA and LA appendage   There was severe aortic regurgitation.  Conclusions: There is Severe Aortic regurgitation.  aortic valve non-coronary cusp prolapase noted. No evidence for thrombus to account for CVA Will need surgical consult/eval, could be performed on outpatient basis.   Agbor-Etang 07/23/2020 2:06 PM

## 2020-07-23 NOTE — Progress Notes (Signed)
Brandi Holland   DOB:[birth year]   CM#:[id number]    Subjective: No acute events overnight.  No bleeding gums.  Shortness of breath is better.  Patient awaiting TEE this afternoon.  Objective:  Vitals:   08-02-2020 1415 02-Aug-2020 1430  BP: 126/60 (!) 142/68  Pulse: 97 98  Resp: 17 (!) 24  Temp:    SpO2: 95% 97%     Intake/Output Summary (Last 24 hours) at 08/02/20 1606 Last data filed at 07/22/2020 1700 Gross per 24 hour  Intake --  Output 150 ml  Net -150 ml    Physical Exam Constitutional:      Comments: Accompanied by her husband.   HENT:     Head: Normocephalic and atraumatic.     Mouth/Throat:     Pharynx: No oropharyngeal exudate.  Eyes:     Pupils: Pupils are equal, round, and reactive to light.  Cardiovascular:     Rate and Rhythm: Normal rate and regular rhythm.  Pulmonary:     Effort: No respiratory distress.     Breath sounds: No wheezing.     Comments: Decreased breath sounds bilaterally.  Abdominal:     General: Bowel sounds are normal. There is no distension.     Palpations: Abdomen is soft. There is no mass.     Tenderness: There is no abdominal tenderness. There is no guarding or rebound.  Musculoskeletal:        General: No tenderness. Normal range of motion.     Cervical back: Normal range of motion and neck supple.  Skin:    General: Skin is warm.  Neurological:     Mental Status: She is alert and oriented to person, place, and time.  Psychiatric:        Mood and Affect: Affect normal.      Labs:  Lab Results  Component Value Date   WBC 7.9 02-Aug-2020   HGB 8.8 (L) 2020-08-02   HCT 28.2 (L) 08/02/2020   MCV 100.4 (H) 2020-08-02   PLT 97 (L) 2020-08-02   NEUTROABS 6.0 August 02, 2020    Lab Results  Component Value Date   NA 138 08-02-20   K 4.0 08/02/20   CL 101 08/02/2020   CO2 27 08-02-20    Studies:  ECHO TEE  Result Date: 2020/08/02    TRANSESOPHOGEAL ECHO REPORT   Patient Name:   Brandi Holland Date of Exam: Aug 02, 2020 Medical  Rec #:  [id number]       Height:       57.0 in Accession #:    [id number]      Weight:       202.2 lb Date of Birth:  05/17/1965        BSA:          1.807 m Patient Age:    55 years        BP:           110/62 mmHg Patient Gender: F               HR:           95 bpm. Exam Location:  [facility] Procedure: Transesophageal Echo, Cardiac Doppler and Color Doppler Indications:     Not listed on TEE check-in sheet  History:         Patient has prior history of Echocardiogram examinations, most                  recent 07/17/2020. Risk Factors:Sleep Apnea.  Sonographer:  Cristela Blue RDCS (AE) Referring Phys:  [id number] Lennon Alstrom Diagnosing Phys: Debbe Odea MD PROCEDURE: The transesophogeal probe was passed without difficulty through the esophogus of the patient. Sedation performed by performing physician. The patient developed no complications during the procedure. Study time, quality limited due to patient's  Poor respiratory function/respiratory distress. IMPRESSIONS  1. Left ventricular ejection fraction, by estimation, is 55 to 60%. The left ventricle has normal function.  2. Right ventricular systolic function is normal. The right ventricular size is normal.  3. No left atrial/left atrial appendage thrombus was detected.  4. The mitral valve is normal in structure. Mild mitral valve regurgitation.  5. 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 The aortic valve is tricuspid. Aortic valve regurgitation is severe.  6. Agitated saline contrast bubble study was negative, with no evidence of any interatrial shunt. FINDINGS  Left Ventricle: Left ventricular ejection fraction, by estimation, is 55 to 60%. The left ventricle has normal function. The left ventricular internal cavity size was normal in size. Right Ventricle: The right ventricular size is normal. No increase in right ventricular wall thickness. Right ventricular systolic function  is normal. Left Atrium: Left atrial size was normal in size. No left atrial/left atrial appendage thrombus was detected. Right Atrium: Right atrial size was normal in size. Pericardium: There is no evidence of pericardial effusion. Mitral Valve: The mitral valve is normal in structure. Mild mitral valve regurgitation. Tricuspid Valve: The tricuspid valve is normal in structure. Tricuspid valve regurgitation is not demonstrated. 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 The aortic valve is tricuspid. Aortic valve regurgitation is severe. Pulmonic Valve: The pulmonic valve was grossly normal. Pulmonic valve regurgitation is not visualized. Aorta: The aortic root is normal in size and structure. IAS/Shunts: No atrial level shunt detected by color flow Doppler. Agitated saline contrast was given intravenously to evaluate for intracardiac shunting. Agitated saline contrast bubble study was negative, with no evidence of any interatrial shunt. Debbe Odea MD Electronically signed by Debbe Odea MD Signature Date/Time: 07/23/2020/3:48:06 PM    Final     Thrombocytopenia [facility]) 55 year old female patient with multiple medical problems is currently in the hospital for acute mental status changes/acute renal failure/anemia thrombocytopenia  #Thrombocytopenia-moderate [trended down from ~100 on admission]-noted to have peripheral destruction [elevated immature platelet fraction].  On prednisone 60 mg a day [started on 6/05].  Platelets improved from 56 to 97.  Given platelet improvement-recommend taper 40 mg once a day starting tomorrow; taper further on outpatient basis/based on platelet count.  DRV VT-negative for lupus anticoagulant; do not suspect any inhibitors related factors.  Recommend antiphospholipid antibody panel.  #Anemia-hemoglobin around 8-9- ; acute on chronic-no evidence of hemolysis/schistocytes.  Iron  studies inconclusive of iron deficiency.  Overall stable.  #Acute mental status changes/stroke on MRI-improved.  TEE negative for vegetations/source of stroke.  Noted to have aortic regurgitation.   Earna Coder, MD 07/23/2020  4:06 PM

## 2020-07-24 ENCOUNTER — Encounter: Payer: Self-pay | Admitting: Cardiology

## 2020-07-24 LAB — CBC WITH DIFFERENTIAL/PLATELET
Abs Immature Granulocytes: 0.11 10*3/uL — ABNORMAL HIGH (ref 0.00–0.07)
Basophils Absolute: 0 10*3/uL (ref 0.0–0.1)
Basophils Relative: 0 %
Eosinophils Absolute: 0 10*3/uL (ref 0.0–0.5)
Eosinophils Relative: 0 %
HCT: 29.1 % — ABNORMAL LOW (ref 36.0–46.0)
Hemoglobin: 9 g/dL — ABNORMAL LOW (ref 12.0–15.0)
Immature Granulocytes: 1 %
Lymphocytes Relative: 30 %
Lymphs Abs: 2.5 10*3/uL (ref 0.7–4.0)
MCH: 31.6 pg (ref 26.0–34.0)
MCHC: 30.9 g/dL (ref 30.0–36.0)
MCV: 102.1 fL — ABNORMAL HIGH (ref 80.0–100.0)
Monocytes Absolute: 0.7 10*3/uL (ref 0.1–1.0)
Monocytes Relative: 9 %
Neutro Abs: 4.9 10*3/uL (ref 1.7–7.7)
Neutrophils Relative %: 60 %
Platelets: 113 10*3/uL — ABNORMAL LOW (ref 150–400)
RBC: 2.85 MIL/uL — ABNORMAL LOW (ref 3.87–5.11)
RDW: 22.4 % — ABNORMAL HIGH (ref 11.5–15.5)
Smear Review: NORMAL
WBC: 8.4 10*3/uL (ref 4.0–10.5)
nRBC: 0 % (ref 0.0–0.2)

## 2020-07-24 LAB — BASIC METABOLIC PANEL
Anion gap: 5 (ref 5–15)
BUN: 42 mg/dL — ABNORMAL HIGH (ref 6–20)
CO2: 27 mmol/L (ref 22–32)
Calcium: 9.3 mg/dL (ref 8.9–10.3)
Chloride: 104 mmol/L (ref 98–111)
Creatinine, Ser: 0.92 mg/dL (ref 0.44–1.00)
GFR, Estimated: >60 mL/min (ref >60)
Glucose, Bld: 92 mg/dL (ref 70–99)
Potassium: 3.7 mmol/L (ref 3.5–5.1)
Sodium: 136 mmol/L (ref 135–145)

## 2020-07-24 LAB — ANTIPHOSPHOLIPID SYNDROME PROF
Anticardiolipin IgG: <9 GPL U/mL (ref 0–14)
Anticardiolipin IgM: <9 MPL U/mL (ref 0–12)
DRVVT: 30.4 s (ref 0.0–47.0)
PTT Lupus Anticoagulant: 28.7 s (ref 0.0–51.9)

## 2020-07-24 LAB — PNH PROFILE (-HIGH SENSITIVITY)

## 2020-07-24 MED ORDER — SENNOSIDES-DOCUSATE SODIUM 8.6-50 MG PO TABS
1.0000 | ORAL_TABLET | Freq: Two times a day (BID) | ORAL | Status: DC
  Administered 2020-07-24 – 2020-07-31 (×14): 1 via ORAL
  Filled 2020-07-24 (×16): qty 1

## 2020-07-24 MED ORDER — IPRATROPIUM-ALBUTEROL 0.5-2.5 (3) MG/3ML IN SOLN
3.0000 mL | Freq: Two times a day (BID) | RESPIRATORY_TRACT | Status: DC
  Administered 2020-07-25 – 2020-07-28 (×7): 3 mL via RESPIRATORY_TRACT
  Filled 2020-07-24 (×9): qty 3

## 2020-07-24 MED ORDER — POLYETHYLENE GLYCOL 3350 17 G PO PACK
17.0000 g | PACK | Freq: Every day | ORAL | Status: DC
  Administered 2020-07-24 – 2020-07-30 (×7): 17 g via ORAL
  Filled 2020-07-24 (×9): qty 1

## 2020-07-24 NOTE — Progress Notes (Signed)
Progress Note  Patient Name: Brandi Holland Date of Encounter: 07/24/2020  [facility] Cardiologist: Dr. Kirke Corin  Subjective   TTE showed no thrombus, normal bubble study, no PFO/VSD/ASD, normal EF, but did show severe AR. She is still somewhat confused and very hard of hearing. She denies chest pain or SOB. Still having lower extremity weakness.   Inpatient Medications    Scheduled Meds:   stroke: mapping our early stages of recovery book   Does not apply Once   budesonide (PULMICORT) nebulizer solution  0.5 mg Nebulization BID   Chlorhexidine Gluconate Cloth  6 each Topical Q0600   feeding supplement  237 mL Oral BID BM   folic acid  1 mg Oral Daily   furosemide  40 mg Oral Daily   ipratropium-albuterol  3 mL Nebulization Q6H   mouth rinse  15 mL Mouth Rinse BID   nicotine  21 mg Transdermal Daily   predniSONE  40 mg Oral Q breakfast   sodium chloride flush  3 mL Intravenous Q12H   Continuous Infusions:  PRN Meds: acetaminophen **OR** acetaminophen, bisacodyl, ipratropium-albuterol, LORazepam   Vital Signs    Vitals:   07/24/20 0307 07/24/20 0439 07/24/20 0756 07/24/20 0807  BP:  109/70  124/62  Pulse:  86  86  Resp:  18  20  Temp:  98.7 F (37.1 C)  97.9 F (36.6 C)  TempSrc:  Oral  Oral  SpO2:  100% 94% 100%  Weight: 87.6 kg     Height:       No intake or output data in the 24 hours ending 07/24/20 0922 Last 3 Weights 07/24/2020 07/22/2020 07/21/2020  Weight (lbs) 193 lb 2 oz 202 lb 2.6 oz 202 lb 13.2 oz  Weight (kg) 87.6 kg 91.7 kg 92 kg      Telemetry    NSR, HR 90-100, no afib/flutter- Personally Reviewed  ECG    No new- Personally Reviewed  Physical Exam   GEN: No acute distress.   Neck: No JVD Cardiac: RRR, + murmur, rubs, or gallops.  Respiratory: minimal crackles and wheezing GI: Soft, nontender, non-distended  MS: No edema; No deformity. Neuro:  reported b/l leg weakness Psych: Normal affect   Labs    High Sensitivity Troponin:    Recent Labs  Lab 07/16/20 2016 07/17/20 0534 07/17/20 0901 07/17/20 1227  TROPONINIHS 708* 869* 846* 904*      Chemistry Recent Labs  Lab 07/18/20 0115 07/18/20 1216 07/19/20 0345 07/20/20 0420 07/21/20 0411 07/22/20 0411 07/23/20 0549 07/24/20 0500  NA 134*  --  135 136   < > 134* 138 136  K 3.7  --  3.0* 4.0   < > 3.7 4.0 3.7  CL 100  --  103 102   < > 101 101 104  CO2 21*  --  24 27   < > 26 27 27   GLUCOSE 120*  --  100* 122*   < > 105* 123* 92  BUN 72*  --  44* 40*   < > 39* 41* 42*  CREATININE 2.41*  --  1.38* 1.14*   < > 1.04* 0.84 0.92  CALCIUM 8.9  --  8.3* 8.5*   < > 9.1 9.5 9.3  ALBUMIN 2.9*  --  2.3* 2.5*  --   --   --   --   BILITOT  --  2.6*  --   --   --   --   --   --  GFRNONAA 23*  --  45* 56*   < > >60 >60 >60  ANIONGAP 13  --  8 7   < > 7 10 5    < > = values in this interval not displayed.     Hematology Recent Labs  Lab 07/22/20 0411 07/23/20 0549 07/24/20 0500  WBC 6.9 7.9 8.4  RBC 2.81* 2.81* 2.85*  HGB 8.7* 8.8* 9.0*  HCT 28.5* 28.2* 29.1*  MCV 101.4* 100.4* 102.1*  MCH 31.0 31.3 31.6  MCHC 30.5 31.2 30.9  RDW 22.9* 22.3* 22.4*  PLT 78* 97* 113*    BNPNo results for input(s): BNP, PROBNP in the last 168 hours.   DDimer No results for input(s): DDIMER in the last 168 hours.   Radiology    ECHO TEE  Result Date: 07/23/2020    TRANSESOPHOGEAL ECHO REPORT   Patient Name:   Brandi Holland Date of Exam: 07/23/2020 Medical Rec #:  [id number]       Height:       57.0 in Accession #:    [id number]      Weight:       202.2 lb Date of Birth:  Feb 13, 1966        BSA:          1.807 m Patient Age:    55 years        BP:           110/62 mmHg Patient Gender: F               HR:           95 bpm. Exam Location:  [facility] Procedure: Transesophageal Echo, Cardiac Doppler and Color Doppler Indications:     Not listed on TEE check-in sheet  History:         Patient has prior history of Echocardiogram examinations, most                  recent 07/17/2020. Risk  Factors:Sleep Apnea.  Sonographer:     09/16/2020 RDCS (AE) Referring Phys:  Cristela Blue [id number] Diagnosing Phys: Lennon Alstrom MD PROCEDURE: The transesophogeal probe was passed without difficulty through the esophogus of the patient. Sedation performed by performing physician. The patient developed no complications during the procedure. Study time, quality limited due to patient's  Poor respiratory function/respiratory distress. IMPRESSIONS  1. Left ventricular ejection fraction, by estimation, is 55 to 60%. The left ventricle has normal function.  2. Right ventricular systolic function is normal. The right ventricular size is normal.  3. No left atrial/left atrial appendage thrombus was detected.  4. The mitral valve is normal in structure. Mild mitral valve regurgitation.  5. 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 The aortic valve is tricuspid. Aortic valve regurgitation is severe.  6. Agitated saline contrast bubble study was negative, with no evidence of any interatrial shunt. FINDINGS  Left Ventricle: Left ventricular ejection fraction, by estimation, is 55 to 60%. The left ventricle has normal function. The left ventricular internal cavity size was normal in size. Right Ventricle: The right ventricular size is normal. No increase in right ventricular wall thickness. Right ventricular systolic function is normal. Left Atrium: Left atrial size was normal in size. No left atrial/left atrial appendage thrombus was detected. Right Atrium: Right atrial size was normal in size. Pericardium: There is no evidence of pericardial effusion. Mitral Valve: The mitral valve is normal in  structure. Mild mitral valve regurgitation. Tricuspid Valve: The tricuspid valve is normal in structure. Tricuspid valve regurgitation is not demonstrated. Aortic Valve: There is prolapse of the non-coronary cusp of the aortic valve, which is the  likely mechanism for aortic regurgitation. The aortic valve is tricuspid. Aortic valve regurgitation is severe. Pulmonic Valve: The pulmonic valve was grossly normal. Pulmonic valve regurgitation is not visualized. Aorta: The aortic root is normal in size and structure. IAS/Shunts: No atrial level shunt detected by color flow Doppler. Agitated saline contrast was given intravenously to evaluate for intracardiac shunting. Agitated saline contrast bubble study was negative, with no evidence of any interatrial shunt. Debbe Odea MD Electronically signed by Debbe Odea MD Signature Date/Time: 07/23/2020/3:48:06 PM    Final     Cardiac Studies   Echo 07/17/2020: 1. Left ventricular ejection fraction, by estimation, is 50 to 55%. The  left ventricle has low normal function. The left ventricle has no regional  wall motion abnormalities. Left ventricular diastolic parameters are  indeterminate.   2. Right ventricular systolic function is normal. The right ventricular  size is normal. Tricuspid regurgitation signal is inadequate for assessing  PA pressure.   3. Left atrial size was mildly dilated.   4. Right atrial size was mildly dilated.   5. The mitral valve is normal in structure. No evidence of mitral valve  regurgitation. No evidence of mitral stenosis.   6. The aortic valve is normal in structure. Aortic valve regurgitation is  mild. Mild aortic valve stenosis.   7. The inferior vena cava is dilated in size with <50% respiratory  variability, suggesting right atrial pressure of 15 mmHg.  Patient Profile     55 y.o. female with pmh of COPD, smoker, OSA, asthma who presented with respiratory failure, diagnosed with CVA.  Assessment & Plan    Acute Cryptogenic embolic stroke/AMS - unclear reason for AMS, infection vs CVA however no obvious source of infection. Also had AKI with Scr up to 3.15 - DVT study negative - TEE without cardioembolic source - TEE with sever AI, otherwise  normal - Tele with no Afib/flutter, however might need ILR vs heart monitor at discharge - Mental statins is improving - per neurology/IM  HFpEF - Normal EF by echo this admission - euvolemic on exam - was previously on IV lasix>>continue lasix 40 mg daily  Respiratory failure/COPD/tobacco use - on and off 2L O2 - elevated Ddimer but unable to obtain CTA due to AKI, VQ scan canceled due to recurrent desat - s/p IV lasix - breathing improving  Severe AR - per TTE showed severe AI, needs further outpatient work-up by structural heart team  Elevated troponin - further work-up as outpatient.   Anemia/thrombocytopenia - question of ?GIB - s/p 3unit PRBCs and IV iron - on prednisone - heme/onc following  For questions or updates, please contact [facility] Please consult [email or web address] for contact info under        Signed, Yazaira Speas David Stall, PA-C  07/24/2020, 9:22 AM

## 2020-07-24 NOTE — Progress Notes (Addendum)
Nutrition Follow-up  DOCUMENTATION CODES:  Obesity unspecified  INTERVENTION:  Advance diet consistency as able per SLP recommendations Continue Ensure Enlive po, decrease to 1x/d, each supplement provides 350 kcal and 20 grams of protein  NUTRITION DIAGNOSIS:  Inadequate oral intake related to lethargy/confusion as evidenced by per patient/family report.  GOAL:  Patient will meet greater than or equal to 90% of their needs  MONITOR:  PO intake, Supplement acceptance, Labs, Weight trends, I & O's  REASON FOR ASSESSMENT: Consult Assessment of nutrition requirement/status  ASSESSMENT:  55 year old female with a past medical history significant for asthma, OSA, and fibromyalgia who presented to [facility] ED on 07/16/2020 due to complaints of shortness of breath and altered mental status. Admitted for acute frontal lobe CVA and acute hypoxic respiratory failure.   Pt resting in bed at the time of assessment, extremely hard of hearing. Husband at bedside assists with hx. States that she is eating well, intake is at/better than baseline. Likes ensure supplements she is receiving this admission. Weight loss is noted since beng admitted, edema still present. Likely related to fluid changes.   SLP evaluated 6/3 and recommended dysphagia 2 diet d/t pt's poor dentition. However, discussed with pt and husband and report she does not have trouble consuming foods at baseline despite poor dentition. Messaged SLP for possible diet liberalization evaluation.  Noted no BM recorded since 6/2. Discussed with MD and RN, MD requests prn suppository be given. Will monitor.   6/8 - TEE, findings: severe aortic regurgitation.  aortic valve non-coronary cusp prolapase noted  Average Meal Intake: 6/1-6/9: 76% intake x 4 recorded meals  Nutritionally Relevant Medications: Scheduled Meds:  folic acid  1 mg Oral Daily   furosemide  40 mg Oral Daily   predniSONE  40 mg Oral Q breakfast   PRN Meds:  bisacodyl  Labs reviewed: BUN 42  NUTRITION - FOCUSED PHYSICAL EXAM: Flowsheet Row Most Recent Value  Orbital Region No depletion  Upper Arm Region No depletion  Thoracic and Lumbar Region No depletion  Buccal Region No depletion  Temple Region No depletion  Clavicle Bone Region No depletion  Clavicle and Acromion Bone Region No depletion  Scapular Bone Region No depletion  Dorsal Hand No depletion  Patellar Region No depletion  Anterior Thigh Region No depletion  Posterior Calf Region No depletion  Edema (RD Assessment) Mild  Hair Reviewed  Eyes Reviewed  Mouth Reviewed  Skin Reviewed  Nails Reviewed   Diet Order:   Diet Order             DIET DYS 2 Room service appropriate? Yes; Fluid consistency: Thin  Diet effective now                   EDUCATION NEEDS: No education needs have been identified at this time  Skin:  Skin Assessment: Reviewed RN Assessment  Last BM:  6/2  Height:  Ht Readings from Last 1 Encounters:  07/16/20 4\' 9"  (1.448 m)    Weight:  Wt Readings from Last 1 Encounters:  07/24/20 87.6 kg   BMI:  Body mass index is 41.79 kg/m.  Estimated Nutritional Needs:  Kcal:  1450-1650 Protein:  55-70g Fluid:  1.6L/day  09/23/20, RD, LDN Clinical Dietitian Pager on Amion

## 2020-07-24 NOTE — TOC Progression Note (Signed)
Transition of Care [facility]) - Progression Note    Patient Details  Name: Brandi Holland MRN: [id number] Date of Birth: 1965/07/11  [facility] CM/SW Contact  Barrie Dunker, RN Phone Number: 07/24/2020, 12:09 PM  Clinical Narrative:    [facility] has declined the patient due to not being INN with the insurance, Called [facility]  [phone] Left a secure VM requesting a call back      Expected Discharge Plan and Services                                                 Social Determinants of Health (SDOH) Interventions    Readmission Risk Interventions No flowsheet data found.

## 2020-07-24 NOTE — Progress Notes (Signed)
Physical Therapy Treatment Patient Details Name: Brandi Holland MRN: [id number] DOB: [birth year] Today's Date: 07/24/2020    History of Present Illness Pt is a 55 y/o F with PMH: unremarkable due to noncompliance with medical care. Pt presented to ED with 2-week history of feeling unwell/acting confused. Pt was seen at an OSH for concern for fluid overload with CHF as differentials. W/u at [facility] included: MRI showing multiple embolic looking infarctions in bilateral hemispheres concerning for a central embolic source. 2D Echo: unlikely HF, but does show left and right atrial enlargement. Blood cultures + for staph epi. Pt adm for encephalopathy with CVAs and ICU d/t respiratory failure.    PT Comments    Pt is making good progress towards goals tolerating 2nd session of therapy this date. Improved ambulation distance noted, required seated rest break between bouts of ambulation. Good endurance with there-ex. Still demonstrates cognition deficits. Able to show increased use of L UE when R UE restrained; otherwise appears neglectful. Continues to be motivated to perform therapy. Continue to recommend CIR.   Follow Up Recommendations  CIR     Equipment Recommendations  Rolling walker with 5" wheels    Recommendations for Other Services       Precautions / Restrictions Precautions Precautions: Fall    Mobility  Bed Mobility Overal bed mobility: Needs Assistance Bed Mobility: Supine to Sit;Sit to Supine     Supine to sit: Min assist     General bed mobility comments: safe technique with able to sit with upright posture. Needs mod cues for intitiation    Transfers Overall transfer level: Needs assistance Equipment used: Rolling walker (2 wheeled) Transfers: Sit to/from Stand Sit to Stand: Mod assist   Squat pivot transfers: +2 physical assistance     General transfer comment: initially required mod assist +2 for standing with cues for sequencing, however improved to mod assist  of 1.  Ambulation/Gait Ambulation/Gait assistance: Mod assist Gait Distance (Feet): 15 Feet Assistive device: Rolling walker (2 wheeled) Gait Pattern/deviations: Step-to pattern     General Gait Details: ambulation performed in 3 bouts with seated rest break in between. 2nd person required for chair follow. HR increased to 130s with exertion with O2 sats decreasing to 89%. Cues for PLB for recovery   Stairs             Wheelchair Mobility    Modified Rankin (Stroke Patients Only)       Balance Overall balance assessment: Needs assistance Sitting-balance support: Feet unsupported;Single extremity supported Sitting balance-Leahy Scale: Good Sitting balance - Comments: Steady static sitting, reaching within BOS.   Standing balance support: During functional activity;Bilateral upper extremity supported Standing balance-Leahy Scale: Fair Standing balance comment: UE support for balance                            Cognition Arousal/Alertness: Awake/alert Behavior During Therapy: Anxious Overall Cognitive Status: Impaired/Different from baseline                                 General Comments: has trouble hearing and affects following commands      Exercises Other Exercises Other Exercises: supine/seated ther-ex performed including bridging, LAQ, and alt marching. 10 reps with cues for sequencing. MOnitored vitals throughout.    General Comments        Pertinent Vitals/Pain Pain Assessment: No/denies pain    Home Living  Prior Function            PT Goals (current goals can now be found in the care plan section) Acute Rehab PT Goals Patient Stated Goal: to get stronger PT Goal Formulation: With patient Time For Goal Achievement: 08/02/20 Potential to Achieve Goals: Good Progress towards PT goals: Progressing toward goals    Frequency    7X/week      PT Plan Current plan remains appropriate     Co-evaluation              AM-PAC PT "6 Clicks" Mobility   Outcome Measure  Help needed turning from your back to your side while in a flat bed without using bedrails?: A Little Help needed moving from lying on your back to sitting on the side of a flat bed without using bedrails?: A Little Help needed moving to and from a bed to a chair (including a wheelchair)?: A Lot Help needed standing up from a chair using your arms (e.g., wheelchair or bedside chair)?: A Lot Help needed to walk in hospital room?: A Lot Help needed climbing 3-5 steps with a railing? : Total 6 Click Score: 13    End of Session Equipment Utilized During Treatment: Gait belt;Oxygen Activity Tolerance: Patient tolerated treatment well Patient left: in chair;with chair alarm set;with family/visitor present Nurse Communication: Mobility status PT Visit Diagnosis: Muscle weakness (generalized) (M62.81);Difficulty in walking, not elsewhere classified (R26.2);Apraxia (R48.2)     Time: 7619-5093 PT Time Calculation (min) (ACUTE ONLY): 38 min  Charges:  $Gait Training: 23-37 mins $Therapeutic Exercise: 8-22 mins                     Elizabeth Palau, PT, DPT [phone]    Wendell Fiebig 07/24/2020, 4:51 PM

## 2020-07-24 NOTE — Progress Notes (Signed)
Occupational Therapy Treatment Patient Details Name: Brandi Holland MRN: [id number] DOB: May 30, 1965 Today's Date: 07/24/2020    History of present illness Pt is a 55 y/o F with PMH: unremarkable due to noncompliance with medical care. Pt presented to ED with 2-week history of feeling unwell/acting confused. Pt was seen at an OSH for concern for fluid overload with CHF as differentials. W/u at [facility] included: MRI showing multiple embolic looking infarctions in bilateral hemispheres concerning for a central embolic source. 2D Echo: unlikely HF, but does show left and right atrial enlargement. Blood cultures + for staph epi. Pt adm for encephalopathy with CVAs and ICU d/t respiratory failure.   OT comments  Upon entering the room, pt supine in bed with husband and RN present in the room. Pt on 1 L O2 via [place] with very shallow and quick breaths. Pt reporting she feels anxious but not know why. RN notified this session. Pt agreeable to OT intervention. Supine >sit with mod A to EOB. Pt sitting on EOB with close supervision for grooming tasks. Pt needing mod multimodal cuing for sequencing and initiation of tasks. Pt needing increased time for processing. OT placing self care items to the L for her to obtain and use L UE or B UE coordination tasks. OT applied lotion to pt's UEs and pt able to rub in and attend to L UE. Pt standing with mod lifting assistance. She was unable to motor plan side steps but when RW placed in front of pt and therapist assisting with advancement for side steps pt was able to take several side steps to the L with min - mod A. Pt returning to bed at end of session with mod A for L LE and trunk support. Bed alarm activated and call bell within reach. Pt continues to benefit from OT intervention. Pt would benefit from intensive inpatient rehab progress to address functional deficits.   Follow Up Recommendations  CIR    Equipment Recommendations  Other (comment) (defer to next venue of  care)    Recommendations for Other Services      Precautions / Restrictions Precautions Precautions: Fall Restrictions Weight Bearing Restrictions: No       Mobility Bed Mobility Overal bed mobility: Needs Assistance Bed Mobility: Supine to Sit;Sit to Supine     Supine to sit: Mod assist Sit to supine: Mod assist   General bed mobility comments: mod multimodal cuing for technique with pt needing assistance for trunk and L LE.    Transfers Overall transfer level: Needs assistance Equipment used: Rolling walker (2 wheeled) Transfers: Sit to/from Stand Sit to Stand: Mod assist         General transfer comment: mod A to stand from standard height bed. Pt unable to motor plan steps without RW in front and therapist moving RW and pt following with min - mod A.    Balance Overall balance assessment: Needs assistance Sitting-balance support: Feet unsupported;Single extremity supported Sitting balance-Leahy Scale: Good Sitting balance - Comments: Steady static sitting, reaching within BOS.   Standing balance support: During functional activity;Bilateral upper extremity supported Standing balance-Leahy Scale: Fair Standing balance comment: UE support for balance                           ADL either performed or assessed with clinical judgement   ADL Overall ADL's : Needs assistance/impaired     Grooming: Wash/dry face;Wash/dry hands;Brushing hair;Sitting;Set up;Min guard  Vision Patient Visual Report: No change from baseline                Cognition Arousal/Alertness: Awake/alert Behavior During Therapy: Anxious Overall Cognitive Status: Impaired/Different from baseline Area of Impairment: Attention;Following commands;Safety/judgement;Awareness                 Orientation Level: Disoriented to;Place;Time;Situation Current Attention Level: Sustained Memory: Decreased short-term  memory Following Commands: Follows one step commands inconsistently;Follows one step commands with increased time Safety/Judgement: Decreased awareness of deficits Awareness: Intellectual Problem Solving: Slow processing;Decreased initiation;Difficulty sequencing;Requires verbal cues;Requires tactile cues General Comments: increased time to process and needs mod A multimodal cuing and often does better with gesture cuing.                   Pertinent Vitals/ Pain       Pain Assessment: No/denies pain   Frequency  Min 3X/week        Progress Toward Goals  OT Goals(current goals can now be found in the care plan section)  Progress towards OT goals: Progressing toward goals  Acute Rehab OT Goals Patient Stated Goal: to get stronger OT Goal Formulation: With family Time For Goal Achievement: 08/01/20 Potential to Achieve Goals: Fair  Plan Discharge plan remains appropriate;Frequency remains appropriate       AM-PAC OT "6 Clicks" Daily Activity     Outcome Measure   Help from another person eating meals?: A Lot Help from another person taking care of personal grooming?: A Little Help from another person toileting, which includes using toliet, bedpan, or urinal?: A Lot Help from another person bathing (including washing, rinsing, drying)?: A Lot Help from another person to put on and taking off regular upper body clothing?: A Little Help from another person to put on and taking off regular lower body clothing?: A Lot 6 Click Score: 14    End of Session Equipment Utilized During Treatment: Oxygen (1 L via [place])  OT Visit Diagnosis: Unsteadiness on feet (R26.81);Muscle weakness (generalized) (M62.81);Other symptoms and signs involving the nervous system (R29.898);Other symptoms and signs involving cognitive function   Activity Tolerance Patient tolerated treatment well   Patient Left in bed;with call bell/phone within reach;with bed alarm set;with family/visitor present    Nurse Communication Mobility status;Other (comment) (pt reports feeling anxious)        Time: 4166-0630 OT Time Calculation (min): 38 min  Charges: OT General Charges $OT Visit: 1 Visit OT Treatments $Self Care/Home Management : 8-22 mins $Neuromuscular Re-education: 23-37 mins  Jackquline Denmark, MS, OTR/L , CBIS ascom [phone]  07/24/20, 1:05 PM

## 2020-07-24 NOTE — Progress Notes (Signed)
Subjective: Husband reports that she is improving  Exam: Vitals:   07/24/20 0807 07/24/20 1154  BP: 124/62 120/63  Pulse: 86 97  Resp: 20 (!) 24  Temp: 97.9 F (36.6 C) 98 F (36.7 C)  SpO2: 100% 97%   Gen: In bed, NAD Resp: non-labored breathing, no acute distress Abd: soft, nt  Neuro: MS: Awake, alert, able to give me the month and year.  She is unable to name some simple objects including thumb and finger. 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  TEE without evidence of any shunt, making venous thrombosis with conduction to the arterial system much less likely.  I agree with checking antiphospholipid antibodies as these can result in arterial emboli.  It is unclear to me exactly what is causing her thrombocytopenia, but her platelets are improving with steroids. I do think her encephalopathy is in large part due to her multifocal strokes, but the fact that it seems to be improving is reassuring. Some of the improvement is likley improvement in her renal function as well.   Recommendations: 1) Would consider prolonged(e.g. 30 day) cardiac monitor to look for atrial fibrillation.  2) Continue working with therapies.  3) Appreciate heme/onc assistance, agree with antiphospholipids and anticoagulation if positive.    Ritta Slot, MD [facility] [phone]  If 7pm- 7am, please page neurology on call as listed in AMION.

## 2020-07-24 NOTE — Progress Notes (Signed)
PROGRESS NOTE    KALISHA KEADLE  ZWC:[id number] DOB: 27-Mar-1965 DOA: 07/16/2020 PCP: Pcp, No   Brief Narrative: Taken from H&P.  YANNELY KINTZEL is a 55 y.o. female with medical history significant of asthma, OSA, fibromyalgia who presents via EMS for shortness of breath.  She was hypoxic in 60s requiring 5 to 6 L of oxygen with intermittent nonrebreather. Initially admitting provider unable to obtained any history as there was no family member at bedside, no one could be reached on phone and patient was very altered.  Able to get hold of husband after multiple attempts at bedside, according to him patient was not feeling well for the past few weeks, she was admitted at [facility] on 06/18/2020 with lower extremity edema and was diuresed and discharged on 06/20/2020.  No echocardiogram done, CTA was negative for PE at that time, no significant abnormality on labs except sodium of 122 which improved to 130 and mild AKI, unknown baseline. Patient continued to feel bad and she was staying in bed for the past more than 2 weeks, unable to get out of bed, very poor p.o. intake.  Per husband she did had diarrhea with black color stool and he has seen some blood in stool last week, diarrhea lasted for 2 days and then resolved.  He did not noticed any hematemesis.  Patient does not drink alcohol but is a heavy smoker, smokes at least 2 packs/day.  Husband was able to brought her to ED 2 weeks ago and then she left after waiting couple of hours without being seen.  Per husband she was refusing to go see a doctor, when she continued to deteriorate he asked her son who lives in [place] who drove from there and talked with her and called the EMS. Husband denies any recent upper respiratory illnesses, fever or chills.  He did noticed worsening shortness of breath.  Patient was mostly bedbound for the past 2 weeks, he was using diapers to help her as she was unable to go to the bathroom by herself due to profound  weakness.  Patient remained very altered with intermittent desaturation, ABG with some respiratory alkalosis, no hypoxia.1/2 blood culture bottles with MSSE, MRI brain with multiple bilateral infarcts, worsening renal function so unable to obtain CTA, VQ scan ordered but got canceled due to recurrent desaturation.  D-dimer at 2.6, hemoglobin of 5.7, platelet counts of 97 this morning and there were 255 during recent hospitalization at [facility], BUN of 73 and creatinine of 2.99, sodium of 130, chloride of 96 and bicarb of 21.  Elevated T bili at 2.5 and INR of 1.5, lactic acidosis 3.2>>2.5>>1.9, elevated troponin 708>>904, BNP at 1893, CT chest and abdomen with bilateral groundglass opacities which can be due to pulmonary edema versus atypical pneumonia and no significant abnormality on CT abdomen.  Echocardiogram with EF of 50 to 55%, which is within lower normal limit, no wall motion abnormalities and indeterminate diastolic parameters, mildly biatrial dilatation and dilated inferior vena cava with less than 50% variability. Received total of 3 units PRBC 6/3:Had swallow evaluation today and they are recommending dysphagia 2 diet. 6/4: Cardiology started her on diuresis with IV Lasix 40 mg daily, some improvement in breathing status. 6/5: Significant improvement in breathing status, worsening thrombocytopenia, started on prednisone 6/6: No new complaint, platelets started improving. 6/7: TEE got canceled again as patient was difficult to arouse this morning, apparently was very agitated overnight and received a dose of Ativan, we will attempt again tomorrow.  6/8-Plan for TEE today. Per husband her MS is improving. Pt hard of hearing. 6/9-TEE no thrombus or pfo. Pt has no complaints .no BM for couple of days     Subjective: No abd pain, n/v, cp or worsening sob.   Assessment & Plan:   Principal Problem:   Acute encephalopathy Active Problems:   Stroke (cerebrum) (HCC)   Asthma   Acute  respiratory failure with hypoxia (HCC)   AKI (acute kidney injury) (HCC)   Hyponatremia   OSA (obstructive sleep apnea)   AMS (altered mental status)   Thrombocytopenia (HCC)   Acute on chronic diastolic heart failure (HCC)   Folate deficiency   Hyperbilirubinemia   SOB (shortness of breath)  Severe acute encephalopathy.  Resolved  Unknown etiology and can be multifactorial with bilateral strokes. No obvious source of infection except 1 out of 2 blood culture with MSSE which is most likely a contaminant, procalcitonin at 0.35>>0.27, concern of cardiac embolic source which can be due to endocarditis but less likely with negative blood cultures . 6/9- MS improving, reaassuring. Neurology following.   Acute hypoxic respiratory failure.  Improving, currently on 2 L of oxygen  Patient has an history of heavy smoking but no underlying diagnosis of COPD, not on home oxygen.  Initially required nonrebreather and then later switched to heated high flow. Might be due to encephalopathy versus pulmonary edema.  Elevated D-dimer but unable to obtain CTA due to AKI, VQ scan got canceled due to recurrent desaturation. Received iv lasix, now on po 6/9 improved.  Less dyspneic.   Continue with Lasix p.o.  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  Hemoglobin and platelet started improving, she was started on prednisone with improving of platelets slowly. 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  Might have GI bleed as husband was mentioning about dark color stools while she was having diarrhea for 2 days last week.  No recurrence at this time. Anemia panel with iron and folate deficiency.  Patient received 1 unit while in the ED, and 2 L next day. Received IV iron X 2 doses.  Hematology is involved-appreciate their help.  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  Rest of the labs pending. -Continue prednisone 60 mg daily-hematology to determine the duration.  CT abdomen with no concern of cirrhosis. 6/9 hemoglobin stable.  Platelets improving with prednisone.   Hematology following input was appreciated.  Since platelets improving recommend tapering 40 mg once a day starting today, taper further  on outpatient basis based on platelets count.  L DRV VT-negative for lupus anticoagulation.  Do not suspect any inhibitors related factors.  Recommended antiphospholipid antibody panel.   No evidence of hemolysis/schistocytes.  Iron studies inconclusive of iron deficiency.     Severe AI- found on TEE. Will need CTS evaluation. - I already contacted their office and they are aware   HFpEF/Elevated troponin.  No prior history of heart failure. EF 50-55% on echo with diastolic dysfunction Will wean off of oxygenation Continue Lasix p.o. Cardiology following  Bilateral multifocal acute cerebral infarct.  Most likely secondary to cardiac emboli, TTE without any obvious source. MRA without any significant stenosis Extremity Doppler scans were negative for any DVT, 1 small acute superficial thrombophlebitis in upper extremity. Neurology following No anticoagulation at this time due to severe anemia and thrombocytopenia. 6/9 TEE was completed on  6/8.  No thrombus noted.  No PFO. Will likely need a Linq monitor for evaluation of possible A. fib that was not detected on telemetry, discussed with cardiology  Constipation-we will start bowel regimen  Hypophosphatemia. Replaced and resolved   Hypoalbuminemia.  Causing some third spacing. Received albumin for hypoalbuminemia and to improve diuresis and third spacing. Albumin was d/c'd.     1/2 blood cultures with MSSE.  Most likely a contaminant.  Patient is afebrile with no leukocytosis, borderline procalcitonin.  Patient received 1 dose of cefepime and 2 doses of  vancomycin earlier during admission.  And received 5 days of cefazolin.  No need for more antibiotics at this time. -Repeat blood culture -negative. -TTE without any significant abnormality D felt the staph epidermidis and staph capitis in 1 set of bcx are contaminents and to dc abx.  Hyponatremia.  Resolved Sodium was 122 during recent admission at [facility] earlier in May  Most likely secondary to poor p.o. intake and volume overload.    AKI.  Improved  Unknown baseline, continue to improve, at 1.04 today.  It was 1.2 during her recent admission at [facility].  Renal ultrasound without any significant abnormality.  Foley was placed for strict intake and output. -Monitor renal function -Avoid nephrotoxins  Stage III obesity. Estimated body mass index is 41.79 kg/m as calculated from the following:   Height as of this encounter:  (1.448 m).   Weight as of this encounter: 87.6 kg.  This will complicate overall prognosis.  Objective: Vitals:   07/23/20 2030 07/24/20 0307 07/24/20 0439 07/24/20 0756  BP:   109/70   Pulse:   86   Resp:   18   Temp:   98.7 F (37.1 C)   TempSrc:   Oral   SpO2: 97%  100% 94%  Weight:  87.6 kg    Height:       No intake or output data in the 24 hours ending 07/24/20 0804  Filed Weights   07/21/20 0500 07/22/20 0500 07/24/20 0307  Weight: 92 kg 91.7 kg 87.6 kg    Examination: NAD, calm CTA no wheeze rales rhonchi's Regular S1-S2 no gallops Soft benign positive bowel sounds No edema Awake and alert Mood and affect appropriate in current setting  DVT prophylaxis: SCDs, patient has severe anemia and thrombocytopenia. Code Status: Full Family Communication: tried calling husband,no answer.  Status is: Inpatient  Remains inpatient appropriate because:Inpatient level of care appropriate due to severity of illness   Dispo: The patient is from: Home              Anticipated d/c is to: CIR              Patient currently is not medically stable  to d/c.   Difficult to place patient No               Level of care: Med-Surg  Consultants:  Neurology Cardiology ID Hematology  Procedures:  Antimicrobials:   Data Reviewed: I have personally reviewed following labs and imaging studies  CBC: Recent Labs  Lab 07/19/20 0345 07/20/20 0420 07/21/20 0411 07/22/20 0411 07/23/20 0549 07/24/20 0500  WBC 5.4 5.3 5.4 6.9 7.9 8.4  NEUTROABS 3.6 3.1  --  3.4 6.0 4.9  HGB 8.0* 8.5* 9.0* 8.7* 8.8* 9.0*  HCT 25.0* 27.5* 28.4* 28.5* 28.2* 29.1*  MCV 95.1 99.3 98.6 101.4* 100.4* 102.1*  PLT 61* 56* 67* 78* 97* 113*   Basic Metabolic Panel: Recent Labs  Lab  07/18/20 0115 07/19/20 0345 07/20/20 0420 07/21/20 0411 07/22/20 0411 07/23/20 0549 07/24/20 0500  NA 134* 135 136 135 134* 138 136  K 3.7 3.0* 4.0 3.8 3.7 4.0 3.7  CL 100 103 102 99 101 101 104  CO2 21* 24 27 27 26 27 27   GLUCOSE 120* 100* 122* 174* 105* 123* 92  BUN 72* 44* 40* 38* 39* 41* 42*  CREATININE 2.41* 1.38* 1.14* 1.03* 1.04* 0.84 0.92  CALCIUM 8.9 8.3* 8.5* 8.9 9.1 9.5 9.3  MG  --  2.2  --   --   --   --   --   PHOS 5.1* 2.5 1.8*  --  3.2  --   --    GFR: Estimated Creatinine Clearance: 62.7 mL/min (by C-G formula based on SCr of 0.92 mg/dL). Liver Function Tests: Recent Labs  Lab 07/18/20 0115 07/18/20 1216 07/19/20 0345 07/20/20 0420  BILITOT  --  2.6*  --   --   ALBUMIN 2.9*  --  2.3* 2.5*   No results for input(s): LIPASE, AMYLASE in the last 168 hours.  Recent Labs  Lab 07/18/20 0837  AMMONIA 17   Coagulation Profile: Recent Labs  Lab 07/20/20 1106  INR 1.2   Cardiac Enzymes: No results for input(s): CKTOTAL, CKMB, CKMBINDEX, TROPONINI in the last 168 hours.  BNP (last 3 results) No results for input(s): PROBNP in the last 8760 hours. HbA1C: No results for input(s): HGBA1C in the last 72 hours. CBG: Recent Labs  Lab 07/18/20 0022  GLUCAP 112*   Lipid Profile: No results for input(s): CHOL, HDL, LDLCALC, TRIG, CHOLHDL,  LDLDIRECT in the last 72 hours. Thyroid Function Tests: No results for input(s): TSH, T4TOTAL, FREET4, T3FREE, THYROIDAB in the last 72 hours. Anemia Panel: No results for input(s): VITAMINB12, FOLATE, FERRITIN, TIBC, IRON, RETICCTPCT in the last 72 hours. Sepsis Labs: Recent Labs  Lab 07/17/20 0901 07/17/20 1227 07/18/20 0115  PROCALCITON  --   --  0.27  LATICACIDVEN 2.5* 1.9  --     Recent Results (from the past 240 hour(s))  Blood culture (single)     Status: Abnormal   Collection Time: 07/16/20  8:07 PM   Specimen: BLOOD  Result Value Ref Range Status   Specimen Description   Final    BLOOD LEFT ANTECUBITAL Performed at [facility], [address] [place], [place] [address]    Special Requests   Final    BOTTLES DRAWN AEROBIC AND ANAEROBIC Blood Culture results may not be optimal due to an inadequate volume of blood received in culture bottles Performed at [facility], [address] [place], [place] [address]    Culture  Setup Time   Final    GRAM POSITIVE COCCI IN BOTH AEROBIC AND ANAEROBIC BOTTLES CRITICAL RESULT CALLED TO, READ BACK BY AND VERIFIED WITH: DEVIN MITCHELL 1200 07/17/2020 [facility]    Culture (A)  Final    STAPHYLOCOCCUS EPIDERMIDIS STAPHYLOCOCCUS CAPITIS THE SIGNIFICANCE OF ISOLATING THIS ORGANISM FROM A SINGLE SET OF BLOOD CULTURES WHEN MULTIPLE SETS ARE DRAWN IS UNCERTAIN. PLEASE NOTIFY THE MICROBIOLOGY DEPARTMENT WITHIN ONE WEEK IF SPECIATION AND SENSITIVITIES ARE REQUIRED. Performed at [facility], [address] [place], [place] [address]    Report Status 07/19/2020 FINAL  Final  Blood Culture ID Panel (Reflexed)     Status: Abnormal   Collection Time: 07/16/20  8:07 PM  Result Value Ref Range Status   Enterococcus faecalis NOT DETECTED NOT DETECTED Final   Enterococcus  Faecium NOT DETECTED NOT DETECTED Final   Listeria monocytogenes NOT DETECTED NOT DETECTED Final   Staphylococcus species DETECTED (A) NOT DETECTED Final     Comment: CRITICAL RESULT CALLED TO, READ BACK BY AND VERIFIED WITH: DEVIN MITCHELL 1200 07/17/2020 DLB    Staphylococcus aureus (BCID) NOT DETECTED NOT DETECTED Final   Staphylococcus epidermidis DETECTED (A) NOT DETECTED Final    Comment: CRITICAL RESULT CALLED TO, READ BACK BY AND VERIFIED WITH: DEVIN MITCHELL 1200 07/17/2020 DLB    Staphylococcus lugdunensis NOT DETECTED NOT DETECTED Final   Streptococcus species NOT DETECTED NOT DETECTED Final   Streptococcus agalactiae NOT DETECTED NOT DETECTED Final   Streptococcus pneumoniae NOT DETECTED NOT DETECTED Final   Streptococcus pyogenes NOT DETECTED NOT DETECTED Final   A.calcoaceticus-baumannii NOT DETECTED NOT DETECTED Final   Bacteroides fragilis NOT DETECTED NOT DETECTED Final   Enterobacterales NOT DETECTED NOT DETECTED Final   Enterobacter cloacae complex NOT DETECTED NOT DETECTED Final   Escherichia coli NOT DETECTED NOT DETECTED Final   Klebsiella aerogenes NOT DETECTED NOT DETECTED Final   Klebsiella oxytoca NOT DETECTED NOT DETECTED Final   Klebsiella pneumoniae NOT DETECTED NOT DETECTED Final   Proteus species NOT DETECTED NOT DETECTED Final   Salmonella species NOT DETECTED NOT DETECTED Final   Serratia marcescens NOT DETECTED NOT DETECTED Final   Haemophilus influenzae NOT DETECTED NOT DETECTED Final   Neisseria meningitidis NOT DETECTED NOT DETECTED Final   Pseudomonas aeruginosa NOT DETECTED NOT DETECTED Final   Stenotrophomonas maltophilia NOT DETECTED NOT DETECTED Final   Candida albicans NOT DETECTED NOT DETECTED Final   Candida auris NOT DETECTED NOT DETECTED Final   Candida glabrata NOT DETECTED NOT DETECTED Final   Candida krusei NOT DETECTED NOT DETECTED Final   Candida parapsilosis NOT DETECTED NOT DETECTED Final   Candida tropicalis NOT DETECTED NOT DETECTED Final   Cryptococcus neoformans/gattii NOT DETECTED NOT DETECTED Final   Methicillin resistance mecA/C NOT DETECTED NOT DETECTED Final    Comment:  Performed at [facility], [address] [place], [place] [address]  Resp Panel by RT-PCR (Flu A&B, Covid) Nasopharyngeal Swab     Status: None   Collection Time: 07/16/20  8:16 PM   Specimen: Nasopharyngeal Swab; Nasopharyngeal(NP) swabs in vial transport medium  Result Value Ref Range Status   SARS Coronavirus 2 by RT PCR NEGATIVE NEGATIVE Final    Comment: (NOTE) SARS-CoV-2 target nucleic acids are NOT DETECTED.  The SARS-CoV-2 RNA is generally detectable in upper respiratory specimens during the acute phase of infection. The lowest concentration of SARS-CoV-2 viral copies this assay can detect is 138 copies/mL. A negative result does not preclude SARS-Cov-2 infection and should not be used as the sole basis for treatment or other patient management decisions. A negative result may occur with  improper specimen collection/handling, submission of specimen other than nasopharyngeal swab, presence of viral mutation(s) within the areas targeted by this assay, and inadequate number of viral copies(<138 copies/mL). A negative result must be combined with clinical observations, patient history, and epidemiological information. The expected result is Negative.  Fact Sheet for Patients:  BloggerCourse.com  Fact Sheet for Healthcare Providers:  SeriousBroker.it  This test is no t yet approved or cleared by the [place] FDA and  has been authorized for detection and/or diagnosis of SARS-CoV-2 by FDA under an Emergency Use Authorization (EUA). This EUA will remain  in effect (meaning this test can be used) for the duration of the COVID-19 declaration under Section 564(b)(1) of the  Act, 21 U.S.C.section 360bbb-3(b)(1), unless the authorization is terminated  or revoked sooner.       Influenza A by PCR NEGATIVE NEGATIVE Final   Influenza B by PCR NEGATIVE NEGATIVE Final    Comment: (NOTE) The Xpert Xpress  SARS-CoV-2/FLU/RSV plus assay is intended as an aid in the diagnosis of influenza from Nasopharyngeal swab specimens and should not be used as a sole basis for treatment. Nasal washings and aspirates are unacceptable for Xpert Xpress SARS-CoV-2/FLU/RSV testing.  Fact Sheet for Patients: BloggerCourse.com  Fact Sheet for Healthcare Providers: SeriousBroker.it  This test is not yet approved or cleared by the [place] FDA and has been authorized for detection and/or diagnosis of SARS-CoV-2 by FDA under an Emergency Use Authorization (EUA). This EUA will remain in effect (meaning this test can be used) for the duration of the COVID-19 declaration under Section 564(b)(1) of the Act, 21 U.S.C. section 360bbb-3(b)(1), unless the authorization is terminated or revoked.  Performed at [facility], [address] [place], [place] [address]   MRSA PCR Screening     Status: None   Collection Time: 07/18/20 12:38 AM   Specimen: Nasal Mucosa; Nasopharyngeal  Result Value Ref Range Status   MRSA by PCR NEGATIVE NEGATIVE Final    Comment:        The GeneXpert MRSA Assay (FDA approved for NASAL specimens only), is one component of a comprehensive MRSA colonization surveillance program. It is not intended to diagnose MRSA infection nor to guide or monitor treatment for MRSA infections. Performed at [facility], [address] [place], [place] [address]   CULTURE, BLOOD (ROUTINE X 2) w Reflex to ID Panel     Status: None   Collection Time: 07/18/20  3:13 PM   Specimen: BLOOD  Result Value Ref Range Status   Specimen Description BLOOD BLOOD RIGHT FOREARM  Final   Special Requests   Final    BOTTLES DRAWN AEROBIC AND ANAEROBIC Blood Culture adequate volume   Culture   Final    NO GROWTH 5 DAYS Performed at [facility], [address] [place], [place] [address]    Report Status 07/23/2020 FINAL   Final  CULTURE, BLOOD (ROUTINE X 2) w Reflex to ID Panel     Status: None   Collection Time: 07/18/20  3:17 PM   Specimen: BLOOD  Result Value Ref Range Status   Specimen Description BLOOD BLOOD RIGHT HAND  Final   Special Requests   Final    BOTTLES DRAWN AEROBIC AND ANAEROBIC Blood Culture adequate volume   Culture   Final    NO GROWTH 5 DAYS Performed at [facility], [address] [place], [place] [address]    Report Status 07/23/2020 FINAL  Final     Radiology Studies: ECHO TEE  Result Date: 07/23/2020    TRANSESOPHOGEAL ECHO REPORT   Patient Name:   ZONIE CRUTCHER Date of Exam: 07/23/2020 Medical Rec #:  [id number]       Height:       57.0 in Accession #:    [id number]      Weight:       202.2 lb Date of Birth:  November 14, 1965        BSA:          1.807 m Patient Age:    55 years        BP:           110/62 mmHg Patient Gender: F  HR:           95 bpm. Exam Location:  [facility] Procedure: Transesophageal Echo, Cardiac Doppler and Color Doppler Indications:     Not listed on TEE check-in sheet  History:         Patient has prior history of Echocardiogram examinations, most                  recent 07/17/2020. Risk Factors:Sleep Apnea.  Sonographer:     Cristela Blue RDCS (AE) Referring Phys:  [id number] Lennon Alstrom Diagnosing Phys: Debbe Odea MD PROCEDURE: The transesophogeal probe was passed without difficulty through the esophogus of the patient. Sedation performed by performing physician. The patient developed no complications during the procedure. Study time, quality limited due to patient's  Poor respiratory function/respiratory distress. IMPRESSIONS  1. Left ventricular ejection fraction, by estimation, is 55 to 60%. The left ventricle has normal function.  2. Right ventricular systolic function is normal. The right ventricular size is normal.  3. No left atrial/left atrial appendage thrombus was detected.  4. The mitral valve is normal in structure. Mild mitral valve  regurgitation.  5. 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 The aortic valve is tricuspid. Aortic valve regurgitation is severe.  6. Agitated saline contrast bubble study was negative, with no evidence of any interatrial shunt. FINDINGS  Left Ventricle: Left ventricular ejection fraction, by estimation, is 55 to 60%. The left ventricle has normal function. The left ventricular internal cavity size was normal in size. Right Ventricle: The right ventricular size is normal. No increase in right ventricular wall thickness. Right ventricular systolic function is normal. Left Atrium: Left atrial size was normal in size. No left atrial/left atrial appendage thrombus was detected. Right Atrium: Right atrial size was normal in size. Pericardium: There is no evidence of pericardial effusion. Mitral Valve: The mitral valve is normal in structure. Mild mitral valve regurgitation. Tricuspid Valve: The tricuspid valve is normal in structure. Tricuspid valve regurgitation is not demonstrated. 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 The aortic valve is tricuspid. Aortic valve regurgitation is severe. Pulmonic Valve: The pulmonic valve was grossly normal. Pulmonic valve regurgitation is not visualized. Aorta: The aortic root is normal in size and structure. IAS/Shunts: No atrial level shunt detected by color flow Doppler. Agitated saline contrast was given intravenously to evaluate for intracardiac shunting. Agitated saline contrast bubble study was negative, with no evidence of any interatrial shunt. Debbe Odea MD Electronically signed by Debbe Odea MD Signature Date/Time: 07/23/2020/3:48:06 PM    Final     Scheduled Meds:   stroke: mapping our early stages of recovery book   Does not apply Once   budesonide (PULMICORT) nebulizer solution  0.5  mg Nebulization BID   Chlorhexidine Gluconate Cloth  6 each Topical Q0600   feeding supplement  237 mL Oral BID BM   folic acid  1 mg Oral Daily   furosemide  40 mg Oral Daily   ipratropium-albuterol  3 mL Nebulization Q6H   mouth rinse  15 mL Mouth Rinse BID   nicotine  21 mg Transdermal Daily   predniSONE  40 mg Oral Q breakfast   sodium chloride flush  3 mL Intravenous Q12H   Continuous Infusions:     LOS: 7 days   Time spent: 35 minutes with >50% on coc  Lizvet Chunn  Marylu Lund, MD [facility]  If 7PM-7AM, please contact night-coverage [email or web address]  07/24/2020, 8:04 AM

## 2020-07-25 DIAGNOSIS — D693 Immune thrombocytopenic purpura: Secondary | ICD-10-CM

## 2020-07-25 DIAGNOSIS — I6340 Cerebral infarction due to embolism of unspecified cerebral artery: Principal | ICD-10-CM

## 2020-07-25 DIAGNOSIS — I351 Nonrheumatic aortic (valve) insufficiency: Secondary | ICD-10-CM

## 2020-07-25 LAB — CBC WITH DIFFERENTIAL/PLATELET
Abs Immature Granulocytes: 0.14 10*3/uL — ABNORMAL HIGH (ref 0.00–0.07)
Basophils Absolute: 0 10*3/uL (ref 0.0–0.1)
Basophils Relative: 0 %
Eosinophils Absolute: 0 10*3/uL (ref 0.0–0.5)
Eosinophils Relative: 0 %
HCT: 29.4 % — ABNORMAL LOW (ref 36.0–46.0)
Hemoglobin: 9.2 g/dL — ABNORMAL LOW (ref 12.0–15.0)
Immature Granulocytes: 2 %
Lymphocytes Relative: 22 %
Lymphs Abs: 2 10*3/uL (ref 0.7–4.0)
MCH: 31.5 pg (ref 26.0–34.0)
MCHC: 31.3 g/dL (ref 30.0–36.0)
MCV: 100.7 fL — ABNORMAL HIGH (ref 80.0–100.0)
Monocytes Absolute: 0.7 10*3/uL (ref 0.1–1.0)
Monocytes Relative: 8 %
Neutro Abs: 6.1 10*3/uL (ref 1.7–7.7)
Neutrophils Relative %: 68 %
Platelets: 131 10*3/uL — ABNORMAL LOW (ref 150–400)
RBC: 2.92 MIL/uL — ABNORMAL LOW (ref 3.87–5.11)
RDW: 21.2 % — ABNORMAL HIGH (ref 11.5–15.5)
Smear Review: NORMAL
WBC: 8.9 10*3/uL (ref 4.0–10.5)
nRBC: 0 % (ref 0.0–0.2)

## 2020-07-25 MED ORDER — ASPIRIN EC 81 MG PO TBEC
81.0000 mg | DELAYED_RELEASE_TABLET | Freq: Every day | ORAL | Status: DC
  Administered 2020-07-25 – 2020-08-01 (×8): 81 mg via ORAL
  Filled 2020-07-25 (×10): qty 1

## 2020-07-25 NOTE — Progress Notes (Signed)
Antiphospholipid antibodies are negative. No further recommendations other than antiplatelet therapy with aspirin(ordered) and prolonged cardiac monitoring.   Neurology will be available as needed moving forward. Please call with furhter questions or concerns.    Ritta Slot, MD [facility] [phone]  If 7pm- 7am, please page neurology on call as listed in [facility].

## 2020-07-25 NOTE — Progress Notes (Addendum)
PROGRESS NOTE    Brandi Holland  ZWC:[id number] DOB: 27-Mar-1965 DOA: 07/16/2020 PCP: Pcp, No   Brief Narrative: Taken from H&P.  Brandi Holland is a 55 y.o. female with medical history significant of asthma, OSA, fibromyalgia who presents via EMS for shortness of breath.  She was hypoxic in 60s requiring 5 to 6 L of oxygen with intermittent nonrebreather. Initially admitting provider unable to obtained any history as there was no family member at bedside, no one could be reached on phone and patient was very altered.  Able to get hold of husband after multiple attempts at bedside, according to him patient was not feeling well for the past few weeks, she was admitted at [facility] on 06/18/2020 with lower extremity edema and was diuresed and discharged on 06/20/2020.  No echocardiogram done, CTA was negative for PE at that time, no significant abnormality on labs except sodium of 122 which improved to 130 and mild AKI, unknown baseline. Patient continued to feel bad and she was staying in bed for the past more than 2 weeks, unable to get out of bed, very poor p.o. intake.  Per husband she did had diarrhea with black color stool and he has seen some blood in stool last week, diarrhea lasted for 2 days and then resolved.  He did not noticed any hematemesis.  Patient does not drink alcohol but is a heavy smoker, smokes at least 2 packs/day.  Husband was able to brought her to ED 2 weeks ago and then she left after waiting couple of hours without being seen.  Per husband she was refusing to go see a doctor, when she continued to deteriorate he asked her son who lives in [place] who drove from there and talked with her and called the EMS. Husband denies any recent upper respiratory illnesses, fever or chills.  He did noticed worsening shortness of breath.  Patient was mostly bedbound for the past 2 weeks, he was using diapers to help her as she was unable to go to the bathroom by herself due to profound  weakness.  Patient remained very altered with intermittent desaturation, ABG with some respiratory alkalosis, no hypoxia.1/2 blood culture bottles with MSSE, MRI brain with multiple bilateral infarcts, worsening renal function so unable to obtain CTA, VQ scan ordered but got canceled due to recurrent desaturation.  D-dimer at 2.6, hemoglobin of 5.7, platelet counts of 97 this morning and there were 255 during recent hospitalization at [facility], BUN of 73 and creatinine of 2.99, sodium of 130, chloride of 96 and bicarb of 21.  Elevated T bili at 2.5 and INR of 1.5, lactic acidosis 3.2>>2.5>>1.9, elevated troponin 708>>904, BNP at 1893, CT chest and abdomen with bilateral groundglass opacities which can be due to pulmonary edema versus atypical pneumonia and no significant abnormality on CT abdomen.  Echocardiogram with EF of 50 to 55%, which is within lower normal limit, no wall motion abnormalities and indeterminate diastolic parameters, mildly biatrial dilatation and dilated inferior vena cava with less than 50% variability. Received total of 3 units PRBC 6/3:Had swallow evaluation today and they are recommending dysphagia 2 diet. 6/4: Cardiology started her on diuresis with IV Lasix 40 mg daily, some improvement in breathing status. 6/5: Significant improvement in breathing status, worsening thrombocytopenia, started on prednisone 6/6: No new complaint, platelets started improving. 6/7: TEE got canceled again as patient was difficult to arouse this morning, apparently was very agitated overnight and received a dose of Ativan, we will attempt again tomorrow.  6/8-Plan for TEE today. Per husband her MS is improving. Pt hard of hearing. 6/9-TEE no thrombus or pfo. Pt has no complaints .no BM for couple of days 6/10-MS continues to improvement. Husband at bedside. No complaints of chest pain, shortness of breath or nausea    Subjective: As above.  No chest pain or abdominal pain  Assessment & Plan:    Principal Problem:   Acute encephalopathy Active Problems:   Stroke (cerebrum) (HCC)   Asthma   Acute respiratory failure with hypoxia (HCC)   AKI (acute kidney injury) (HCC)   Hyponatremia   OSA (obstructive sleep apnea)   AMS (altered mental status)   Thrombocytopenia (HCC)   Acute on chronic diastolic heart failure (HCC)   Folate deficiency   Hyperbilirubinemia   SOB (shortness of breath)  Severe acute encephalopathy.  Resolved  Unknown etiology and can be multifactorial with bilateral strokes. No obvious source of infection except 1 out of 2 blood culture with MSSE which is most likely a contaminant, procalcitonin at 0.35>>0.27, concern of cardiac embolic source which can be due to endocarditis but less likely with negative blood cultures . 6/10 MS continues to improve and is reassuring Neurology following   Acute hypoxic respiratory failure.  Improving, currently on 2 L of oxygen  Patient has an history of heavy smoking but no underlying diagnosis of COPD, not on home oxygen.  Initially required nonrebreather and then later switched to heated high flow. Might be due to encephalopathy versus pulmonary edema.  Elevated D-dimer but unable to obtain CTA due to AKI, VQ scan got canceled due to recurrent desaturation. Received iv lasix, now on po 6/10 improved.   Continue Lasix p.o.  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  Hemoglobin and platelet started improving, she was started on prednisone with improving of platelets slowly. 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  Might have GI bleed as husband was mentioning about dark color stools while she was having diarrhea for 2 days last week.  No recurrence at this time. Anemia panel with iron and folate deficiency.  Patient received 1 unit while in the ED, and 2 L next  day. Received IV iron X 2 doses.  Hematology is involved-appreciate their help.  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  Rest of the labs pending. -Continue prednisone 60 mg daily-hematology to determine the duration.  CT abdomen with no concern of cirrhosis. 6/9 hemoglobin stable.  Platelets improving with prednisone.   Hematology following input was appreciated.  Since platelets improving recommend tapering 40 mg once a day starting today, taper further  on outpatient basis based on platelets count.  L DRV VT-negative for lupus anticoagulation.  Do not suspect any inhibitors related factors.  Recommended antiphospholipid antibody panel.   No evidence of hemolysis/schistocytes.  Iron studies inconclusive of iron deficiency.   6/10-antiphospholipid panel negative.  No clinical suspicion for TTP/HUS.  Prednisone 60 mg a day started on 6/5.  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 Will need CTS evaluation once above problems improved.  CTS office has been notified.   Cardiology following      HFpEF/Elevated troponin.  No prior history of heart failure. EF 50-55% on echo with diastolic dysfunction 6/10 continue Lasix Cardiology following Telemetry no A. fib flutter, will need heart monitor at discharge  Bilateral multifocal acute cerebral infarct.  Most likely secondary to cardiac emboli, TTE without any obvious source. MRA without any significant stenosis Extremity Doppler scans were negative for any DVT, 1 small acute superficial thrombophlebitis in upper extremity. Neurology following No anticoagulation at this time due to severe anemia and thrombocytopenia. 6/9 TEE was completed on 6/8.  No thrombus noted.  No PFO. Will likely need a Linq monitor for evaluation of possible A. fib that was not detected on telemetry, discussed with cardiology  Constipation-we will start bowel regimen  Hypophosphatemia. Replaced and  resolved   Hypoalbuminemia.  Causing some third spacing. Received albumin for hypoalbuminemia and to improve diuresis and third spacing. Albumin was d/c'd.     1/2 blood cultures with MSSE.  Most likely a contaminant.  Patient is afebrile with no leukocytosis, borderline procalcitonin.  Patient received 1 dose of cefepime and 2 doses of vancomycin earlier during admission.  And received 5 days of cefazolin.  No need for more antibiotics at this time. -Repeat blood culture -negative. -TTE without any significant abnormality D felt the staph epidermidis and staph capitis in 1 set of bcx are contaminents and to dc abx. ID signed off  Hyponatremia.  Resolved Sodium was 122 during recent admission at [facility] earlier in May  Most likely secondary to poor p.o. intake and volume overload.    AKI.  Improved  Unknown baseline, continue to improve, at 1.04 today.  It was 1.2 during her recent admission at [facility].  Renal ultrasound without any significant abnormality.  Foley was placed for strict intake and output. -Monitor renal function -Avoid nephrotoxins  Stage III obesity. Estimated body mass index is 41.79 kg/m as calculated from the following:   Height as of this encounter:  (1.448 m).   Weight as of this encounter: 87.6 kg.  This will complicate overall prognosis.  Objective: Vitals:   07/25/20 0414 07/25/20 0809 07/25/20 0815 07/25/20 1217  BP: 132/69 122/63  117/71  Pulse: 93 98  100  Resp: 18 17    Temp: 98.1 F (36.7 C) 98 F (36.7 C)  98 F (36.7 C)  TempSrc: Oral Oral  Oral  SpO2: 96% 93% 92% 93%  Weight:      Height:       No intake or output data in the 24 hours ending 07/25/20 1324  Filed Weights   07/21/20 0500 07/22/20 0500 07/24/20 0307  Weight: 92 kg 91.7 kg 87.6 kg    Examination: Sitting up in bed husband at bedside eating breakfast CTA no wheeze rales rhonchi's Regular S1-S2 no gallops Soft benign positive bowel sounds No edema Alert oriented  x3 Mood and affect appropriate in current setting  DVT prophylaxis: SCDs, patient has severe anemia and thrombocytopenia. Code Status: Full Family Communication: Husband at bedside  Status is: Inpatient  Remains inpatient appropriate because:Inpatient level of care appropriate due to severity of illness   Dispo: The patient is from: Home              Anticipated d/c is to: CIR-insurance authorization/bed pending              Patient currently is not medically stable to d/c.   Difficult to place patient No               Level of care: Med-Surg  Consultants:  Neurology Cardiology ID Hematology  Procedures:  Antimicrobials:   Data Reviewed: I have personally reviewed following labs and imaging studies  CBC: Recent Labs  Lab 07/20/20 0420 07/21/20 0411 07/22/20 0411 07/23/20 0549 07/24/20 0500 07/25/20 0435  WBC 5.3 5.4 6.9 7.9 8.4 8.9  NEUTROABS 3.1  --  3.4 6.0 4.9 6.1  HGB 8.5* 9.0* 8.7* 8.8* 9.0* 9.2*  HCT 27.5* 28.4* 28.5* 28.2* 29.1* 29.4*  MCV 99.3 98.6 101.4* 100.4* 102.1* 100.7*  PLT 56* 67* 78* 97* 113* 131*   Basic Metabolic Panel: Recent Labs  Lab 07/19/20 0345 07/20/20 0420 07/21/20 0411 07/22/20 0411 07/23/20 0549 07/24/20 0500  NA 135 136 135 134* 138 136  K 3.0* 4.0 3.8 3.7 4.0 3.7  CL 103 102 99 101 101 104  CO2 24 27 27 26 27 27   GLUCOSE 100* 122* 174* 105* 123* 92  BUN 44* 40* 38* 39* 41* 42*  CREATININE 1.38* 1.14* 1.03* 1.04* 0.84 0.92  CALCIUM 8.3* 8.5* 8.9 9.1 9.5 9.3  MG 2.2  --   --   --   --   --   PHOS 2.5 1.8*  --  3.2  --   --    GFR: Estimated Creatinine Clearance: 62.7 mL/min (by C-G formula based on SCr of 0.92 mg/dL). Liver Function Tests: Recent Labs  Lab 07/19/20 0345 07/20/20 0420  ALBUMIN 2.3* 2.5*   No results for input(s): LIPASE, AMYLASE in the last 168 hours.  No results for input(s): AMMONIA in the last 168 hours.  Coagulation Profile: Recent Labs  Lab 07/20/20 1106  INR 1.2   Cardiac  Enzymes: No results for input(s): CKTOTAL, CKMB, CKMBINDEX, TROPONINI in the last 168 hours.  BNP (last 3 results) No results for input(s): PROBNP in the last 8760 hours. HbA1C: No results for input(s): HGBA1C in the last 72 hours. CBG: No results for input(s): GLUCAP in the last 168 hours.  Lipid Profile: No results for input(s): CHOL, HDL, LDLCALC, TRIG, CHOLHDL, LDLDIRECT in the last 72 hours. Thyroid Function Tests: No results for input(s): TSH, T4TOTAL, FREET4, T3FREE, THYROIDAB in the last 72 hours. Anemia Panel: No results for input(s): VITAMINB12, FOLATE, FERRITIN, TIBC, IRON, RETICCTPCT in the last 72 hours. Sepsis Labs: No results for input(s): PROCALCITON, LATICACIDVEN in the last 168 hours.   Recent Results (from the past 240 hour(s))  Blood culture (single)     Status: Abnormal   Collection Time: 07/16/20  8:07 PM   Specimen: BLOOD  Result Value Ref Range Status   Specimen Description   Final    BLOOD LEFT ANTECUBITAL Performed at [facility], [address], [place], [place]    Special Requests   Final    BOTTLES DRAWN AEROBIC AND ANAEROBIC Blood Culture results may not be optimal due to an inadequate volume of blood received in culture bottles Performed at [facility], [address] [place], [place]    Culture  Setup Time   Final    GRAM POSITIVE COCCI IN BOTH AEROBIC AND ANAEROBIC BOTTLES CRITICAL RESULT CALLED TO, READ BACK BY AND VERIFIED WITH: DEVIN MITCHELL 1200 07/17/2020 [facility]    Culture (A)  Final    STAPHYLOCOCCUS EPIDERMIDIS STAPHYLOCOCCUS CAPITIS THE SIGNIFICANCE OF ISOLATING THIS ORGANISM FROM A SINGLE SET OF BLOOD CULTURES WHEN MULTIPLE SETS ARE DRAWN IS UNCERTAIN. PLEASE NOTIFY THE MICROBIOLOGY DEPARTMENT WITHIN ONE WEEK IF SPECIATION AND SENSITIVITIES ARE REQUIRED. Performed at [facility], [address], [place], [place]    Report Status 07/19/2020 FINAL  Final  Blood Culture ID Panel  (Reflexed)     Status: Abnormal   Collection  Time: 07/16/20  8:07 PM  Result Value Ref Range Status   Enterococcus faecalis NOT DETECTED NOT DETECTED Final   Enterococcus Faecium NOT DETECTED NOT DETECTED Final   Listeria monocytogenes NOT DETECTED NOT DETECTED Final   Staphylococcus species DETECTED (A) NOT DETECTED Final    Comment: CRITICAL RESULT CALLED TO, READ BACK BY AND VERIFIED WITH: DEVIN MITCHELL 1200 07/17/2020 DL[facility]    Staphylococcus aureus (BCID) NOT DETECTED NOT DETECTED Final   Staphylococcus epidermidis DETECTED (A) NOT DETECTED Final    Comment: CRITICAL RESULT CALLED TO, READ BACK BY AND VERIFIED WITH: DEVIN MITCHELL 1200 07/17/2020 DL[facility]    Staphylococcus lugdunensis NOT DETECTED NOT DETECTED Final   Streptococcus species NOT DETECTED NOT DETECTED Final   Streptococcus agalactiae NOT DETECTED NOT DETECTED Final   Streptococcus pneumoniae NOT DETECTED NOT DETECTED Final   Streptococcus pyogenes NOT DETECTED NOT DETECTED Final   A.calcoaceticus-baumannii NOT DETECTED NOT DETECTED Final   Bacteroides fragilis NOT DETECTED NOT DETECTED Final   Enterobacterales NOT DETECTED NOT DETECTED Final   Enterobacter cloacae complex NOT DETECTED NOT DETECTED Final   Escherichia coli NOT DETECTED NOT DETECTED Final   Klebsiella aerogenes NOT DETECTED NOT DETECTED Final   Klebsiella oxytoca NOT DETECTED NOT DETECTED Final   Klebsiella pneumoniae NOT DETECTED NOT DETECTED Final   Proteus species NOT DETECTED NOT DETECTED Final   Salmonella species NOT DETECTED NOT DETECTED Final   Serratia marcescens NOT DETECTED NOT DETECTED Final   Haemophilus influenzae NOT DETECTED NOT DETECTED Final   Neisseria meningitidis NOT DETECTED NOT DETECTED Final   Pseudomonas aeruginosa NOT DETECTED NOT DETECTED Final   Stenotrophomonas maltophilia NOT DETECTED NOT DETECTED Final   Candida albicans NOT DETECTED NOT DETECTED Final   Candida auris NOT DETECTED NOT DETECTED Final   Candida glabrata NOT  DETECTED NOT DETECTED Final   Candida krusei NOT DETECTED NOT DETECTED Final   Candida parapsilosis NOT DETECTED NOT DETECTED Final   Candida tropicalis NOT DETECTED NOT DETECTED Final   Cryptococcus neoformans/gattii NOT DETECTED NOT DETECTED Final   Methicillin resistance mecA/C NOT DETECTED NOT DETECTED Final    Comment: Performed at [facility], [address] [place], [place] [address]  Resp Panel by RT-PCR (Flu A&B, Covid) Nasopharyngeal Swab     Status: None   Collection Time: 07/16/20  8:16 PM   Specimen: Nasopharyngeal Swab; Nasopharyngeal(NP) swabs in vial transport medium  Result Value Ref Range Status   SARS Coronavirus 2 by RT PCR NEGATIVE NEGATIVE Final    Comment: (NOTE) SARS-CoV-2 target nucleic acids are NOT DETECTED.  The SARS-CoV-2 RNA is generally detectable in upper respiratory specimens during the acute phase of infection. The lowest concentration of SARS-CoV-2 viral copies this assay can detect is 138 copies/mL. A negative result does not preclude SARS-Cov-2 infection and should not be used as the sole basis for treatment or other patient management decisions. A negative result may occur with  improper specimen collection/handling, submission of specimen other than nasopharyngeal swab, presence of viral mutation(s) within the areas targeted by this assay, and inadequate number of viral copies(<138 copies/mL). A negative result must be combined with clinical observations, patient history, and epidemiological information. The expected result is Negative.  Fact Sheet for Patients:  BloggerCourse.com  Fact Sheet for Healthcare Providers:  SeriousBroker.it  This test is no t yet approved or cleared by the [place] FDA and  has been authorized for detection and/or diagnosis of SARS-CoV-2 by FDA under an Emergency Use Authorization (EUA). This EUA will  remain  in effect (meaning this test can be  used) for the duration of the COVID-19 declaration under Section 564(b)(1) of the Act, 21 U.S.C.section 360bbb-3(b)(1), unless the authorization is terminated  or revoked sooner.       Influenza A by PCR NEGATIVE NEGATIVE Final   Influenza B by PCR NEGATIVE NEGATIVE Final    Comment: (NOTE) The Xpert Xpress SARS-CoV-2/FLU/RSV plus assay is intended as an aid in the diagnosis of influenza from Nasopharyngeal swab specimens and should not be used as a sole basis for treatment. Nasal washings and aspirates are unacceptable for Xpert Xpress SARS-CoV-2/FLU/RSV testing.  Fact Sheet for Patients: BloggerCourse.com  Fact Sheet for Healthcare Providers: SeriousBroker.it  This test is not yet approved or cleared by the [place] FDA and has been authorized for detection and/or diagnosis of SARS-CoV-2 by FDA under an Emergency Use Authorization (EUA). This EUA will remain in effect (meaning this test can be used) for the duration of the COVID-19 declaration under Section 564(b)(1) of the Act, 21 U.S.C. section 360bbb-3(b)(1), unless the authorization is terminated or revoked.  Performed at [facility], [address] [place], [place] [address]   MRSA PCR Screening     Status: None   Collection Time: 07/18/20 12:38 AM   Specimen: Nasal Mucosa; Nasopharyngeal  Result Value Ref Range Status   MRSA by PCR NEGATIVE NEGATIVE Final    Comment:        The GeneXpert MRSA Assay (FDA approved for NASAL specimens only), is one component of a comprehensive MRSA colonization surveillance program. It is not intended to diagnose MRSA infection nor to guide or monitor treatment for MRSA infections. Performed at [facility], [address] [place], [place] [address]   CULTURE, BLOOD (ROUTINE X 2) w Reflex to ID Panel     Status: None   Collection Time: 07/18/20  3:13 PM   Specimen: BLOOD  Result Value Ref Range  Status   Specimen Description BLOOD BLOOD RIGHT FOREARM  Final   Special Requests   Final    BOTTLES DRAWN AEROBIC AND ANAEROBIC Blood Culture adequate volume   Culture   Final    NO GROWTH 5 DAYS Performed at [facility], [address] [place], [place] [address]    Report Status 07/23/2020 FINAL  Final  CULTURE, BLOOD (ROUTINE X 2) w Reflex to ID Panel     Status: None   Collection Time: 07/18/20  3:17 PM   Specimen: BLOOD  Result Value Ref Range Status   Specimen Description BLOOD BLOOD RIGHT HAND  Final   Special Requests   Final    BOTTLES DRAWN AEROBIC AND ANAEROBIC Blood Culture adequate volume   Culture   Final    NO GROWTH 5 DAYS Performed at [facility], [address] [place], [place] [address]    Report Status 07/23/2020 FINAL  Final     Radiology Studies: ECHO TEE  Result Date: 07/23/2020    TRANSESOPHOGEAL ECHO REPORT   Patient Name:   TWYLAH BENNETTS Date of Exam: 07/23/2020 Medical Rec #:  [id number]       Height:       57.0 in Accession #:    [id number]      Weight:       202.2 lb Date of Birth:  [birth year]        BSA:          1.807 m Patient Age:    17 years  BP:           110/62 mmHg Patient Gender: F               HR:           95 bpm. Exam Location:  [facility] Procedure: Transesophageal Echo, Cardiac Doppler and Color Doppler Indications:     Not listed on TEE check-in sheet  History:         Patient has prior history of Echocardiogram examinations, most                  recent 07/17/2020. Risk Factors:Sleep Apnea.  Sonographer:     Cristela BlueJerry Hege RDCS (AE) Referring Phys:  [id number] Lennon AlstromJACQUELYN D VISSER Diagnosing Phys: Debbe OdeaBrian Agbor-Etang MD PROCEDURE: The transesophogeal probe was passed without difficulty through the esophogus of the patient. Sedation performed by performing physician. The patient developed no complications during the procedure. Study time, quality limited due to patient's  Poor respiratory function/respiratory distress. IMPRESSIONS  1.  Left ventricular ejection fraction, by estimation, is 55 to 60%. The left ventricle has normal function.  2. Right ventricular systolic function is normal. The right ventricular size is normal.  3. No left atrial/left atrial appendage thrombus was detected.  4. The mitral valve is normal in structure. Mild mitral valve regurgitation.  5. 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 The aortic valve is tricuspid. Aortic valve regurgitation is severe.  6. Agitated saline contrast bubble study was negative, with no evidence of any interatrial shunt. FINDINGS  Left Ventricle: Left ventricular ejection fraction, by estimation, is 55 to 60%. The left ventricle has normal function. The left ventricular internal cavity size was normal in size. Right Ventricle: The right ventricular size is normal. No increase in right ventricular wall thickness. Right ventricular systolic function is normal. Left Atrium: Left atrial size was normal in size. No left atrial/left atrial appendage thrombus was detected. Right Atrium: Right atrial size was normal in size. Pericardium: There is no evidence of pericardial effusion. Mitral Valve: The mitral valve is normal in structure. Mild mitral valve regurgitation. Tricuspid Valve: The tricuspid valve is normal in structure. Tricuspid valve regurgitation is not demonstrated. 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 The aortic valve is tricuspid. Aortic valve regurgitation is severe. Pulmonic Valve: The pulmonic valve was grossly normal. Pulmonic valve regurgitation is not visualized. Aorta: The aortic root is normal in size and structure. IAS/Shunts: No atrial level shunt detected by color flow Doppler. Agitated saline contrast was given intravenously to evaluate for intracardiac shunting. Agitated saline contrast bubble study  was negative, with no evidence of any interatrial shunt. Debbe OdeaBrian Agbor-Etang MD Electronically signed by Debbe OdeaBrian Agbor-Etang MD Signature Date/Time: 07/23/2020/3:48:06 PM    Final     Scheduled Meds:   stroke: mapping our early stages of recovery book   Does not apply Once   aspirin EC  81 mg Oral Daily   budesonide (PULMICORT) nebulizer solution  0.5 mg Nebulization BID   Chlorhexidine Gluconate Cloth  6 each Topical Q0600   feeding supplement  237 mL Oral BID BM   folic acid  1 mg Oral Daily   furosemide  40 mg Oral Daily   ipratropium-albuterol  3 mL Nebulization BID   mouth rinse  15 mL Mouth Rinse BID   nicotine  21 mg Transdermal Daily   polyethylene  glycol  17 g Oral Daily   predniSONE  40 mg Oral Q breakfast   senna-docusate  1 tablet Oral BID   sodium chloride flush  3 mL Intravenous Q12H   Continuous Infusions:     LOS: 8 days   Time spent: 35 minutes with >50% on coc  Lynn Ito, MD [facility]  If 7PM-7AM, please contact night-coverage [email or web address]  07/25/2020, 1:24 PM

## 2020-07-25 NOTE — Progress Notes (Signed)
Brandi Holland   DOB:[birth year]   FO#:[id number]    Subjective: No acute events overnight.  No bleeding gums.  Shortness of breath is better.  Patient sitting in the bed having dinner.  Accompanied husband.  Objective:  Vitals:   07/24/20 1639 07/24/20 2036  BP: 112/60 (!) 110/59  Pulse: (!) 106 94  Resp: 18 20  Temp: 99.1 F (37.3 C) 98.3 F (36.8 C)  SpO2: 96% 93%     Intake/Output Summary (Last 24 hours) at 07/25/2020 0005 Last data filed at 07/24/2020 1017 Gross per 24 hour  Intake 120 ml  Output --  Net 120 ml    Physical Exam Constitutional:      Comments: Accompanied by her husband.   HENT:     Head: Normocephalic and atraumatic.     Mouth/Throat:     Pharynx: No oropharyngeal exudate.  Eyes:     Pupils: Pupils are equal, round, and reactive to light.  Cardiovascular:     Rate and Rhythm: Normal rate and regular rhythm.  Pulmonary:     Effort: No respiratory distress.     Breath sounds: No wheezing.     Comments: Decreased breath sounds bilaterally.  Abdominal:     General: Bowel sounds are normal. There is no distension.     Palpations: Abdomen is soft. There is no mass.     Tenderness: There is no abdominal tenderness. There is no guarding or rebound.  Musculoskeletal:        General: No tenderness. Normal range of motion.     Cervical back: Normal range of motion and neck supple.  Skin:    General: Skin is warm.  Neurological:     Mental Status: She is alert and oriented to person, place, and time.  Psychiatric:        Mood and Affect: Affect normal.     Labs:  Lab Results  Component Value Date   WBC 8.4 07/24/2020   HGB 9.0 (L) 07/24/2020   HCT 29.1 (L) 07/24/2020   MCV 102.1 (H) 07/24/2020   PLT 113 (L) 07/24/2020   NEUTROABS 4.9 07/24/2020    Lab Results  Component Value Date   NA 136 07/24/2020   K 3.7 07/24/2020   CL 104 07/24/2020   CO2 27 07/24/2020    Studies:  ECHO TEE  Result Date: 08/01/2020    TRANSESOPHOGEAL ECHO REPORT    Patient Name:   Brandi Holland Date of Exam: August 01, 2020 Medical Rec #:  [id number]       Height:       57.0 in Accession #:    [id number]      Weight:       202.2 lb Date of Birth:  09-Oct-1965        BSA:          1.807 m Patient Age:    55 years        BP:           110/62 mmHg Patient Gender: F               HR:           95 bpm. Exam Location:  [facility] Procedure: Transesophageal Echo, Cardiac Doppler and Color Doppler Indications:     Not listed on TEE check-in sheet  History:         Patient has prior history of Echocardiogram examinations, most  recent 07/17/2020. Risk Factors:Sleep Apnea.  Sonographer:     Cristela Blue RDCS (AE) Referring Phys:  [id number] Lennon Alstrom Diagnosing Phys: Debbe Odea MD PROCEDURE: The transesophogeal probe was passed without difficulty through the esophogus of the patient. Sedation performed by performing physician. The patient developed no complications during the procedure. Study time, quality limited due to patient's  Poor respiratory function/respiratory distress. IMPRESSIONS  1. Left ventricular ejection fraction, by estimation, is 55 to 60%. The left ventricle has normal function.  2. Right ventricular systolic function is normal. The right ventricular size is normal.  3. No left atrial/left atrial appendage thrombus was detected.  4. The mitral valve is normal in structure. Mild mitral valve regurgitation.  5. 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 The aortic valve is tricuspid. Aortic valve regurgitation is severe.  6. Agitated saline contrast bubble study was negative, with no evidence of any interatrial shunt. FINDINGS  Left Ventricle: Left ventricular ejection fraction, by estimation, is 55 to 60%. The left ventricle has normal function. The left ventricular internal cavity size was normal in size. Right Ventricle: The right ventricular size is normal. No increase in right  ventricular wall thickness. Right ventricular systolic function is normal. Left Atrium: Left atrial size was normal in size. No left atrial/left atrial appendage thrombus was detected. Right Atrium: Right atrial size was normal in size. Pericardium: There is no evidence of pericardial effusion. Mitral Valve: The mitral valve is normal in structure. Mild mitral valve regurgitation. Tricuspid Valve: The tricuspid valve is normal in structure. Tricuspid valve regurgitation is not demonstrated. 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 The aortic valve is tricuspid. Aortic valve regurgitation is severe. Pulmonic Valve: The pulmonic valve was grossly normal. Pulmonic valve regurgitation is not visualized. Aorta: The aortic root is normal in size and structure. IAS/Shunts: No atrial level shunt detected by color flow Doppler. Agitated saline contrast was given intravenously to evaluate for intracardiac shunting. Agitated saline contrast bubble study was negative, with no evidence of any interatrial shunt. Debbe Odea MD Electronically signed by Debbe Odea MD Signature Date/Time: 07/23/2020/3:48:06 PM    Final      Thrombocytopenia [facility]) 55 year old female patient with multiple medical problems is currently in the hospital for acute mental status changes/acute renal failure/anemia thrombocytopenia  #Thrombocytopenia-moderate [trended down from ~100 on admission]-noted to have peripheral destruction [elevated immature platelet fraction].  DRV VT negative for lupus anticoagulant; clinically do not suspect any inhibitors to other coagulation proteins.  Await antiphospholipid antibody panel.  No clinical suspicion for TTP/HUS.  On prednisone 60 mg a day [started on 6/05].  Platelets are greater than 100.  Continue 40 of prednisone; stable for outpatient.   #Anemia-hemoglobin around 8-9- ; acute on chronic-no evidence  of hemolysis/schistocytes.  Iron studies inconclusive of iron deficiency.  Stable  #Acute mental status changes/stroke on MRI-improved.  Cryptogenic stroke- TEE negative for vegetations/source of stroke.  Noted to have aortic regurgitation.  Discussed with neurology; regarding clinical impression.  Also discussed with husband at the bedside.   Earna Coder, MD 07/25/2020  12:05 AM

## 2020-07-25 NOTE — Progress Notes (Signed)
Progress Note  Patient Name: Brandi Holland Date of Encounter: 07/25/2020  [facility] Cardiologist: Dr. Kirke Corin  Subjective   Patient reported rough night last night, didn't sleep well due to agitation. This morning she is a little better. No chest pain or SOB.   Inpatient Medications    Scheduled Meds:   stroke: mapping our early stages of recovery book   Does not apply Once   aspirin EC  81 mg Oral Daily   budesonide (PULMICORT) nebulizer solution  0.5 mg Nebulization BID   Chlorhexidine Gluconate Cloth  6 each Topical Q0600   feeding supplement  237 mL Oral BID BM   folic acid  1 mg Oral Daily   furosemide  40 mg Oral Daily   ipratropium-albuterol  3 mL Nebulization BID   mouth rinse  15 mL Mouth Rinse BID   nicotine  21 mg Transdermal Daily   polyethylene glycol  17 g Oral Daily   predniSONE  40 mg Oral Q breakfast   senna-docusate  1 tablet Oral BID   sodium chloride flush  3 mL Intravenous Q12H   Continuous Infusions:  PRN Meds: acetaminophen **OR** acetaminophen, bisacodyl, ipratropium-albuterol, LORazepam   Vital Signs    Vitals:   07/24/20 2036 07/25/20 0414 07/25/20 0809 07/25/20 0815  BP: (!) 110/59 132/69 122/63   Pulse: 94 93 98   Resp: 20 18 17    Temp: 98.3 F (36.8 C) 98.1 F (36.7 C) 98 F (36.7 C)   TempSrc: Oral Oral Oral   SpO2: 93% 96% 93% 92%  Weight:      Height:        Intake/Output Summary (Last 24 hours) at 07/25/2020 0912 Last data filed at 07/24/2020 1017 Gross per 24 hour  Intake 120 ml  Output --  Net 120 ml   Last 3 Weights 07/24/2020 07/22/2020 07/21/2020  Weight (lbs) 193 lb 2 oz 202 lb 2.6 oz 202 lb 13.2 oz  Weight (kg) 87.6 kg 91.7 kg 92 kg      Telemetry    SR/ST, HR 90s, upt o 120bpm - Personally Reviewed  ECG     - Personally Reviewed  Physical Exam   GEN: No acute distress.   Neck: No JVD Cardiac: RRR, no murmurs, rubs, or gallops.  Respiratory: minimal wheezing GI: Soft, nontender, non-distended  MS: No  edema; No deformity. Neuro:  Nonfocal  Psych: Normal affect   Labs    High Sensitivity Troponin:   Recent Labs  Lab 07/16/20 2016 07/17/20 0534 07/17/20 0901 07/17/20 1227  TROPONINIHS 708* 869* 846* 904*      Chemistry Recent Labs  Lab 07/18/20 1216 07/19/20 0345 07/19/20 0345 07/20/20 0420 07/21/20 0411 07/22/20 0411 07/23/20 0549 07/24/20 0500  NA  --  135   < > 136   < > 134* 138 136  K  --  3.0*   < > 4.0   < > 3.7 4.0 3.7  CL  --  103   < > 102   < > 101 101 104  CO2  --  24   < > 27   < > 26 27 27   GLUCOSE  --  100*   < > 122*   < > 105* 123* 92  BUN  --  44*   < > 40*   < > 39* 41* 42*  CREATININE  --  1.38*   < > 1.14*   < > 1.04* 0.84 0.92  CALCIUM  --  8.3*   < > 8.5*   < > 9.1 9.5 9.3  ALBUMIN  --  2.3*  --  2.5*  --   --   --   --   BILITOT 2.6*  --   --   --   --   --   --   --   GFRNONAA  --  45*   < > 56*   < > >60 >60 >60  ANIONGAP  --  8   < > 7   < > 7 10 5    < > = values in this interval not displayed.     Hematology Recent Labs  Lab 07/23/20 0549 07/24/20 0500 07/25/20 0435  WBC 7.9 8.4 8.9  RBC 2.81* 2.85* 2.92*  HGB 8.8* 9.0* 9.2*  HCT 28.2* 29.1* 29.4*  MCV 100.4* 102.1* 100.7*  MCH 31.3 31.6 31.5  MCHC 31.2 30.9 31.3  RDW 22.3* 22.4* 21.2*  PLT 97* 113* 131*    BNPNo results for input(s): BNP, PROBNP in the last 168 hours.   DDimer No results for input(s): DDIMER in the last 168 hours.   Radiology    ECHO TEE  Result Date: 07/23/2020    TRANSESOPHOGEAL ECHO REPORT   Patient Name:   Brandi Holland Date of Exam: 07/23/2020 Medical Rec #:  09/22/2020       Height:       57.0 in Accession #:    [id number]      Weight:       202.2 lb Date of Birth:  [birth year]        BSA:          1.807 m Patient Age:    55 years        BP:           110/62 mmHg Patient Gender: F               HR:           95 bpm. Exam Location:  [facility] Procedure: Transesophageal Echo, Cardiac Doppler and Color Doppler Indications:     Not listed on TEE check-in sheet   History:         Patient has prior history of Echocardiogram examinations, most                  recent 07/17/2020. Risk Factors:Sleep Apnea.  Sonographer:     09/16/2020 RDCS (AE) Referring Phys:  Cristela Blue [id number] Diagnosing Phys: Lennon Alstrom MD PROCEDURE: The transesophogeal probe was passed without difficulty through the esophogus of the patient. Sedation performed by performing physician. The patient developed no complications during the procedure. Study time, quality limited due to patient's  Poor respiratory function/respiratory distress. IMPRESSIONS  1. Left ventricular ejection fraction, by estimation, is 55 to 60%. The left ventricle has normal function.  2. Right ventricular systolic function is normal. The right ventricular size is normal.  3. No left atrial/left atrial appendage thrombus was detected.  4. The mitral valve is normal in structure. Mild mitral valve regurgitation.  5. 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 The aortic valve is tricuspid. Aortic valve regurgitation is severe.  6. Agitated saline contrast bubble study was negative, with no evidence of any interatrial shunt. FINDINGS  Left Ventricle: Left ventricular ejection fraction, by estimation, is 55 to 60%. The left ventricle has normal function. The left ventricular internal cavity size was  normal in size. Right Ventricle: The right ventricular size is normal. No increase in right ventricular wall thickness. Right ventricular systolic function is normal. Left Atrium: Left atrial size was normal in size. No left atrial/left atrial appendage thrombus was detected. Right Atrium: Right atrial size was normal in size. Pericardium: There is no evidence of pericardial effusion. Mitral Valve: The mitral valve is normal in structure. Mild mitral valve regurgitation. Tricuspid Valve: The tricuspid valve is normal in structure. Tricuspid valve  regurgitation is not demonstrated. 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 The aortic valve is tricuspid. Aortic valve regurgitation is severe. Pulmonic Valve: The pulmonic valve was grossly normal. Pulmonic valve regurgitation is not visualized. Aorta: The aortic root is normal in size and structure. IAS/Shunts: No atrial level shunt detected by color flow Doppler. Agitated saline contrast was given intravenously to evaluate for intracardiac shunting. Agitated saline contrast bubble study was negative, with no evidence of any interatrial shunt. Debbe Odea MD Electronically signed by Debbe Odea MD Signature Date/Time: 07/23/2020/3:48:06 PM    Final     Cardiac Studies   Echo 07/17/2020: 1. Left ventricular ejection fraction, by estimation, is 50 to 55%. The  left ventricle has low normal function. The left ventricle has no regional  wall motion abnormalities. Left ventricular diastolic parameters are  indeterminate.   2. Right ventricular systolic function is normal. The right ventricular  size is normal. Tricuspid regurgitation signal is inadequate for assessing  PA pressure.   3. Left atrial size was mildly dilated.   4. Right atrial size was mildly dilated.   5. The mitral valve is normal in structure. No evidence of mitral valve  regurgitation. No evidence of mitral stenosis.   6. The aortic valve is normal in structure. Aortic valve regurgitation is  mild. Mild aortic valve stenosis.   7. The inferior vena cava is dilated in size with <50% respiratory  variability, suggesting right atrial pressure of 15 mmHg.  Patient Profile     55 y.o. female with pmh of COPD, smoker, OSA, asthma who presented with respiratory failure, diagnosed with CVA  Assessment & Plan    Acute Cryptogenic embolic stroke/AMS - unclear reason for AMS, infection vs CVA however no obvious source of infection.  Also had AKI with Scr up to 3.15 - DVT study negative - TEE without cardioembolic source - TEE with sever AI, otherwise normal - Tele with no Afib/flutter, however will need heart monitor at discharge - Mental status is improving, she awaiting rehab placement - per neurology/IM   HFpEF - Normal EF by echo this admission - euvolemic on exam - was previously on IV lasix>>continue lasix 40 mg daily   Respiratory failure/COPD/tobacco use - off O2 - elevated Ddimer but unable to obtain CTA due to AKI, VQ scan canceled due to recurrent desat - s/p IV lasix - breathing improving   Severe AR - per TTE showed severe AI, consider repeat echo vs outpatient work-up by structural heart team. Will defer to MD.   Elevated troponin - further work-up as outpatient.    Anemia/thrombocytopenia - s/p 3unit PRBCs and IV iron - on prednisone - heme/onc following  For questions or updates, please contact [facility] Please consult [email or web address] for contact info under        Signed, Morse Brueggemann David Stall, PA-C  07/25/2020, 9:12 AM

## 2020-07-25 NOTE — Progress Notes (Signed)
Brandi Holland   DOB:1965/02/27   QZ#:[id number]    Subjective: No acute events overnight.  Patient currently participating with physical therapy awaiting discharge to rehab.  Patient denies any bleeding.  Objective:  Vitals:   07/25/20 2009 07/26/20 0015  BP:  120/66  Pulse:  87  Resp:  18  Temp:  98.3 F (36.8 C)  SpO2: 95% 94%     Intake/Output Summary (Last 24 hours) at 07/26/2020 0208 Last data filed at 07/25/2020 2048 Gross per 24 hour  Intake 243 ml  Output 300 ml  Net -57 ml    Physical Exam Constitutional:      Comments: Accompanied by family.  HENT:     Head: Normocephalic and atraumatic.     Mouth/Throat:     Pharynx: No oropharyngeal exudate.  Eyes:     Pupils: Pupils are equal, Holland, and reactive to light.  Cardiovascular:     Rate and Rhythm: Normal rate and regular rhythm.  Pulmonary:     Effort: No respiratory distress.     Breath sounds: No wheezing.     Comments: Decreased breath sounds bilaterally at bases.  No wheeze or crackles Abdominal:     General: Bowel sounds are normal. There is no distension.     Palpations: Abdomen is soft. There is no mass.     Tenderness: There is no abdominal tenderness. There is no guarding or rebound.  Musculoskeletal:        General: No tenderness. Normal range of motion.     Cervical back: Normal range of motion and neck supple.  Skin:    General: Skin is warm.  Neurological:     Mental Status: She is alert and oriented to person, place, and time.  Psychiatric:        Mood and Affect: Affect normal.     Labs:  Lab Results  Component Value Date   WBC 8.9 07/25/2020   HGB 9.2 (L) 07/25/2020   HCT 29.4 (L) 07/25/2020   MCV 100.7 (H) 07/25/2020   PLT 131 (L) 07/25/2020   NEUTROABS 6.1 07/25/2020    Lab Results  Component Value Date   NA 136 07/24/2020   K 3.7 07/24/2020   CL 104 07/24/2020   CO2 27 07/24/2020    Studies:  No results found.  Thrombocytopenia (HCC) 55 year old female patient with  multiple medical problems is currently in the hospital for acute mental status changes/acute renal failure/anemia thrombocytopenia  #Thrombocytopenia-moderate [trended down from ~100 on admission]-noted to have peripheral destruction [elevated immature platelet fraction].  DRV VT neg ~130.  Currently 40 mg of prednisone; will taper to 20 mg/to be tapered/discontinued on outpatient basis.  #Anemia-hemoglobin around 8-9- ; acute on chronic-no evidence of hemolysis/schistocytes.  Iron studies inconclusive of iron deficiency.  Stable  #Acute mental status changes/stroke on MRI-improved.  Cryptogenic stroke- TEE negative for vegetations/source of stroke.  Noted to have aortic regurgitation.  #Discussed with patient's husband by the bedside.  Earna Coder, MD 07/26/2020  2:08 AM

## 2020-07-25 NOTE — TOC Progression Note (Signed)
Transition of Care [facility]) - Progression Note    Patient Details  Name: Brandi Holland MRN: [id number] Date of Birth: Jan 13, 1966  Transition of Care [facility]) CM/SW Contact  Margarito Liner, LCSW Phone Number: 07/25/2020, 2:08 PM  Clinical Narrative:   Faxed referral to [facility].  Expected Discharge Plan and Services                                                 Social Determinants of Health (SDOH) Interventions    Readmission Risk Interventions No flowsheet data found.

## 2020-07-25 NOTE — Progress Notes (Signed)
ID TEE neg for any vegetation ID will sign off

## 2020-07-25 NOTE — Progress Notes (Signed)
Physical Therapy Treatment Patient Details Name: Brandi Holland MRN: [id number] DOB: 1965/06/11 Today's Date: 07/25/2020    History of Present Illness Pt is a 55 y/o F with PMH: unremarkable due to noncompliance with medical care. Pt presented to ED with 2-week history of feeling unwell/acting confused. Pt was seen at an OSH for concern for fluid overload with CHF as differentials. W/u at [facility] included: MRI showing multiple embolic looking infarctions in bilateral hemispheres concerning for a central embolic source. 2D Echo: unlikely HF, but does show left and right atrial enlargement. Blood cultures + for staph epi. Pt adm for encephalopathy with CVAs and ICU d/t respiratory failure.    PT Comments    Pt seen for PT tx with pt received with nasal cannula positioned on the back of her head. SpO2 >90% so pt left on room air throughout & at end of session; lowest SPO2 reading was 89% after gait. Pt is unable to initiate donning socks during session, reporting "I'm feeling agitated". Pt is able to complete sit>stand transfer with min assist & stand pivot with mod assist. Progressed to ambulating into hallway with chair follow for safety with pt demonstrating poor awareness re: safe use of RW during gait. Pt is HOH which hinders education, in addition to pt demonstrating impaired cognition. Pt will require 24 hr assistance upon d/c. Will continue to follow pt acutely to progress mobility & increase safety as able.     Follow Up Recommendations  CIR     Equipment Recommendations  Rolling walker with 5" wheels    Recommendations for Other Services       Precautions / Restrictions Precautions Precautions: Fall Restrictions Weight Bearing Restrictions: No    Mobility  Bed Mobility Overal bed mobility: Needs Assistance Bed Mobility: Supine to Sit     Supine to sit: Supervision;HOB elevated (HOB significantly elevated)          Transfers Overall transfer level: Needs  assistance Equipment used: Rolling walker (2 wheeled) Transfers: Sit to/from [employer] Sit to Stand: Min assist Stand pivot transfers: Mod assist       General transfer comment: hand over hand assist for hand placement on armrest to push to standing  Ambulation/Gait Ambulation/Gait assistance: Mod assist;+2 safety/equipment (chair follow for safety) Gait Distance (Feet): 40 Feet Assistive device: Rolling walker (2 wheeled) Gait Pattern/deviations: Decreased step length - right;Decreased step length - left;Decreased stride length Gait velocity: decreased   General Gait Details: Pt ambulates very close to front of RW and PT attempts to pull it out in front of her as pt unable to follow commands re: proper use of AD; pt with ongoing poor awareness of proper use of AD.   Stairs             Wheelchair Mobility    Modified Rankin (Stroke Patients Only)       Balance Overall balance assessment: Needs assistance Sitting-balance support: Bilateral upper extremity supported;Feet supported Sitting balance-Leahy Scale: Fair       Standing balance-Leahy Scale: Poor                              Cognition Arousal/Alertness: Awake/alert Behavior During Therapy: Anxious Overall Cognitive Status: Impaired/Different from baseline Area of Impairment: Attention;Following commands;Safety/judgement;Awareness                 Orientation Level: Disoriented to;Place;Time;Situation   Memory: Decreased short-term memory Following Commands: Follows one step commands inconsistently;Follows  one step commands with increased time Safety/Judgement: Decreased awareness of deficits;Decreased awareness of safety Awareness: Intellectual Problem Solving: Slow processing;Decreased initiation;Difficulty sequencing;Requires verbal cues;Requires tactile cues        Exercises      General Comments General comments (skin integrity, edema, etc.): Pt received  with nasal cannula on the back of hear head so pt on room air throughout session, lowest SPO2 was 89% after gait but otherwise >/= 90%. HR 105 bpm at rest, up to 123 bpm after gait.      Pertinent Vitals/Pain Pain Assessment: Faces Faces Pain Scale: No hurt    Home Living                      Prior Function            PT Goals (current goals can now be found in the care plan section) Acute Rehab PT Goals Patient Stated Goal: to get stronger PT Goal Formulation: With patient Time For Goal Achievement: 08/02/20 Potential to Achieve Goals: Fair Progress towards PT goals: Progressing toward goals    Frequency    7X/week      PT Plan Current plan remains appropriate    Co-evaluation              AM-PAC PT "6 Clicks" Mobility   Outcome Measure  Help needed turning from your back to your side while in a flat bed without using bedrails?: A Little Help needed moving from lying on your back to sitting on the side of a flat bed without using bedrails?: A Little Help needed moving to and from a bed to a chair (including a wheelchair)?: A Lot Help needed standing up from a chair using your arms (e.g., wheelchair or bedside chair)?: A Lot Help needed to walk in hospital room?: A Lot Help needed climbing 3-5 steps with a railing? : Total 6 Click Score: 13    End of Session Equipment Utilized During Treatment: Gait belt Activity Tolerance: Patient tolerated treatment well Patient left: in chair;with chair alarm set;with call bell/phone within reach Nurse Communication: Mobility status PT Visit Diagnosis: Muscle weakness (generalized) (M62.81);Difficulty in walking, not elsewhere classified (R26.2);Apraxia (R48.2)     Time: 1610-9604 PT Time Calculation (min) (ACUTE ONLY): 24 min  Charges:  $Gait Training: 8-22 mins $Therapeutic Activity: 8-22 mins                     Aleda Grana, PT, DPT 07/25/20, 1:26 PM    Sandi Mariscal 07/25/2020, 1:22 PM

## 2020-07-26 DIAGNOSIS — D693 Immune thrombocytopenic purpura: Secondary | ICD-10-CM

## 2020-07-26 LAB — CBC WITH DIFFERENTIAL/PLATELET
Abs Immature Granulocytes: 0.13 10*3/uL — ABNORMAL HIGH (ref 0.00–0.07)
Basophils Absolute: 0 10*3/uL (ref 0.0–0.1)
Basophils Relative: 0 %
Eosinophils Absolute: 0.1 10*3/uL (ref 0.0–0.5)
Eosinophils Relative: 1 %
HCT: 31.2 % — ABNORMAL LOW (ref 36.0–46.0)
Hemoglobin: 9.8 g/dL — ABNORMAL LOW (ref 12.0–15.0)
Immature Granulocytes: 2 %
Lymphocytes Relative: 34 %
Lymphs Abs: 2.9 10*3/uL (ref 0.7–4.0)
MCH: 31.7 pg (ref 26.0–34.0)
MCHC: 31.4 g/dL (ref 30.0–36.0)
MCV: 101 fL — ABNORMAL HIGH (ref 80.0–100.0)
Monocytes Absolute: 0.6 10*3/uL (ref 0.1–1.0)
Monocytes Relative: 7 %
Neutro Abs: 4.8 10*3/uL (ref 1.7–7.7)
Neutrophils Relative %: 56 %
Platelets: 137 10*3/uL — ABNORMAL LOW (ref 150–400)
RBC: 3.09 MIL/uL — ABNORMAL LOW (ref 3.87–5.11)
RDW: 20.9 % — ABNORMAL HIGH (ref 11.5–15.5)
WBC: 8.6 10*3/uL (ref 4.0–10.5)
nRBC: 0 % (ref 0.0–0.2)

## 2020-07-26 LAB — BRAIN NATRIURETIC PEPTIDE: B Natriuretic Peptide: 294.1 pg/mL — ABNORMAL HIGH (ref 0.0–100.0)

## 2020-07-26 MED ORDER — PREDNISONE 20 MG PO TABS
20.0000 mg | ORAL_TABLET | Freq: Every day | ORAL | Status: DC
  Administered 2020-07-27 – 2020-07-31 (×5): 20 mg via ORAL
  Filled 2020-07-26 (×5): qty 1

## 2020-07-26 NOTE — Progress Notes (Signed)
PROGRESS NOTE    Brandi Holland  ZWC:[id number] DOB: 27-Mar-1965 DOA: 07/16/2020 PCP: Pcp, No   Brief Narrative: Taken from H&P.  Brandi Holland is a 55 y.o. female with medical history significant of asthma, OSA, fibromyalgia who presents via EMS for shortness of breath.  She was hypoxic in 60s requiring 5 to 6 L of oxygen with intermittent nonrebreather. Initially admitting provider unable to obtained any history as there was no family member at bedside, no one could be reached on phone and patient was very altered.  Able to get hold of husband after multiple attempts at bedside, according to him patient was not feeling well for the past few weeks, she was admitted at [facility] on 06/18/2020 with lower extremity edema and was diuresed and discharged on 06/20/2020.  No echocardiogram done, CTA was negative for PE at that time, no significant abnormality on labs except sodium of 122 which improved to 130 and mild AKI, unknown baseline. Patient continued to feel bad and she was staying in bed for the past more than 2 weeks, unable to get out of bed, very poor p.o. intake.  Per husband she did had diarrhea with black color stool and he has seen some blood in stool last week, diarrhea lasted for 2 days and then resolved.  He did not noticed any hematemesis.  Patient does not drink alcohol but is a heavy smoker, smokes at least 2 packs/day.  Husband was able to brought her to ED 2 weeks ago and then she left after waiting couple of hours without being seen.  Per husband she was refusing to go see a doctor, when she continued to deteriorate he asked her son who lives in [place] who drove from there and talked with her and called the EMS. Husband denies any recent upper respiratory illnesses, fever or chills.  He did noticed worsening shortness of breath.  Patient was mostly bedbound for the past 2 weeks, he was using diapers to help her as she was unable to go to the bathroom by herself due to profound  weakness.  Patient remained very altered with intermittent desaturation, ABG with some respiratory alkalosis, no hypoxia.1/2 blood culture bottles with MSSE, MRI brain with multiple bilateral infarcts, worsening renal function so unable to obtain CTA, VQ scan ordered but got canceled due to recurrent desaturation.  D-dimer at 2.6, hemoglobin of 5.7, platelet counts of 97 this morning and there were 255 during recent hospitalization at [facility], BUN of 73 and creatinine of 2.99, sodium of 130, chloride of 96 and bicarb of 21.  Elevated T bili at 2.5 and INR of 1.5, lactic acidosis 3.2>>2.5>>1.9, elevated troponin 708>>904, BNP at 1893, CT chest and abdomen with bilateral groundglass opacities which can be due to pulmonary edema versus atypical pneumonia and no significant abnormality on CT abdomen.  Echocardiogram with EF of 50 to 55%, which is within lower normal limit, no wall motion abnormalities and indeterminate diastolic parameters, mildly biatrial dilatation and dilated inferior vena cava with less than 50% variability. Received total of 3 units PRBC 6/3:Had swallow evaluation today and they are recommending dysphagia 2 diet. 6/4: Cardiology started her on diuresis with IV Lasix 40 mg daily, some improvement in breathing status. 6/5: Significant improvement in breathing status, worsening thrombocytopenia, started on prednisone 6/6: No new complaint, platelets started improving. 6/7: TEE got canceled again as patient was difficult to arouse this morning, apparently was very agitated overnight and received a dose of Ativan, we will attempt again tomorrow.  6/8-Plan for TEE today. Per husband her MS is improving. Pt hard of hearing. 6/9-TEE no thrombus or pfo. Pt has no complaints .no BM for couple of days 6/10-MS continues to improvement. Husband at bedside. No complaints of chest pain, shortness of breath or nausea 6/11- no overnight issues.    Subjective: Pt denies sob, cp, dizziness  Assessment &  Plan:   Principal Problem:   Acute encephalopathy Active Problems:   Stroke (cerebrum) (HCC)   Asthma   Acute respiratory failure with hypoxia (HCC)   AKI (acute kidney injury) (HCC)   Hyponatremia   OSA (obstructive sleep apnea)   AMS (altered mental status)   Thrombocytopenia (HCC)   Acute on chronic diastolic heart failure (HCC)   Folate deficiency   Hyperbilirubinemia   SOB (shortness of breath)   Aortic valve regurgitation   Acute ITP (HCC)  Severe acute encephalopathy.  Resolved  Unknown etiology and can be multifactorial with bilateral strokes. No obvious source of infection except 1 out of 2 blood culture with MSSE which is most likely a contaminant, procalcitonin at 0.35>>0.27, concern of cardiac embolic source which can be due to endocarditis but less likely with negative blood cultures . 6/11-MS continues improving which is reassuring  Neurology following now as needed    Acute hypoxic respiratory failure.  Improving, currently on 2 L of oxygen  Patient has an history of heavy smoking but no underlying diagnosis of COPD, not on home oxygen.  Initially required nonrebreather and then later switched to heated high flow. Might be due to encephalopathy versus pulmonary edema.  Elevated D-dimer but unable to obtain CTA due to AKI, VQ scan got canceled due to recurrent desaturation. Received iv lasix, now on po 6/11 improved  BNP lower than it was  Continue Lasix p.o.  Keep O2 sats above 92%      Severe anemia/thrombocytopenia/concern of hemolytic anemia.  Hemoglobin and platelet started improving, she was started on prednisone with improving of platelets slowly. 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  Might have GI bleed as husband was mentioning about dark color stools while she was having diarrhea for 2 days last week.  No recurrence at this time. Anemia panel with iron and folate  deficiency.  Patient received 1 unit while in the ED, and 2 L next day. Received IV iron X 2 doses.  Hematology is involved-appreciate their help.  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  Rest of the labs pending. -Continue prednisone 60 mg daily-hematology to determine the duration.  CT abdomen with no concern of cirrhosis. 6/9 hemoglobin stable.  Platelets improving with prednisone.   Hematology following input was appreciated.  Since platelets improving recommend tapering 40 mg once a day starting today, taper further  on outpatient basis based on platelets count.  L DRV VT-negative for lupus anticoagulation.  Do not suspect any inhibitors related factors.  Recommended antiphospholipid antibody panel.   No evidence of hemolysis/schistocytes.  Iron studies inconclusive of iron deficiency.   6/11 hematology following  Antiphospholipid panel negative  Prednisone 60 mg a day was started on 6/5 Prednisone taper from 40 mg to 20 mg to be tapered discontinued on outpatient basis ..will clarify if need to decrease prednisone to 20mg  here or as outpt       Severe AI- found on TEE. Will need outpatient CT evaluation upon discharge, and as above medical problems improve  CTS office has been notified they  are aware of patient  Cardiology following      HFpEF/Elevated troponin.  No prior history of heart failure. EF 50-55% on echo with diastolic dysfunction 6/11 continue Lasix  Cardiology following  I's and O's   Bilateral multifocal acute cerebral infarct.  Most likely secondary to cardiac emboli, TTE without any obvious source. MRA without any significant stenosis Extremity Doppler scans were negative for any DVT, 1 small acute superficial thrombophlebitis in upper extremity. Neurology following No anticoagulation at this time due to severe anemia and thrombocytopenia. 6/9 TEE was completed on 6/8.  No thrombus noted.  No PFO. 6/11 will need cardiac monitor upon  discharge i.e. possible Linq     Constipation- Continue bowel regimen    Hypophosphatemia. Replaced and resolved   Hypoalbuminemia.  Causing some third spacing. Received albumin for hypoalbuminemia and to improve diuresis and third spacing. Albumin was d/c'd.     1/2 blood cultures with MSSE.  Most likely a contaminant.  Patient is afebrile with no leukocytosis, borderline procalcitonin.  Patient received 1 dose of cefepime and 2 doses of vancomycin earlier during admission.  And received 5 days of cefazolin.  No need for more antibiotics at this time. -Repeat blood culture -negative. -TTE without any significant abnormality D felt the staph epidermidis and staph capitis in 1 set of bcx are contaminents and to dc abx. ID signed off  Hyponatremia.  Resolved Sodium was 122 [facility]  Most likely secondary to poor p.o. intake and volume overload.    AKI.  Improved  Unknown baseline, continue to improve, at 1.04 today.  It was 1.2 during her recent admission at [facility].  Renal ultrasound without any significant abnormality.  Foley was placed for strict intake and output. -Monitor renal function -Avoid nephrotoxins  Stage III obesity. Estimated body mass index is 42.91 kg/m as calculated from the following:   Height as of this encounter: 4\' 9"  (1.448 m).   Weight as of this encounter: 89.9 kg.  This will complicate overall prognosis.  Objective: Vitals:   07/26/20 0454 07/26/20 0456 07/26/20 0812 07/26/20 0813  BP: 139/71   (!) 145/77  Pulse: 93   95  Resp: 20   20  Temp: (!) 97.5 F (36.4 C)   97.8 F (36.6 C)  TempSrc: Oral     SpO2: 93%  97% 97%  Weight:  89.9 kg    Height:        Intake/Output Summary (Last 24 hours) at 07/26/2020 1048 Last data filed at 07/26/2020 0654 Gross per 24 hour  Intake 243 ml  Output 500 ml  Net -257 ml    Filed Weights   07/22/20 0500 07/24/20 0307 07/26/20 0456  Weight: 91.7 kg 87.6 kg 89.9  kg    Examination: NAD, calm CTA no wheeze rales rhonchi's Regular S1-S2 no gallops Soft benign positive bowel sounds No edema Awake and alert Mood and affect appropriate in current setting  DVT prophylaxis: SCDs, patient has severe anemia and thrombocytopenia. Code Status: Full Family Communication: None at bedside  Status is: Inpatient  Remains inpatient appropriate because:unsafe discharge  Dispo: The patient is from: Home              Anticipated d/c is to: CIR-insurance authorization/bed pending              Patient currently pt is medically stable for discharge   Difficult to place patient No  Level of care: Med-Surg  Disposition: CIR bed pending, case mx working on it if you have if you discharge  Consultants:  Neurology Cardiology ID Hematology  Procedures:  Antimicrobials:   Data Reviewed: I have personally reviewed following labs and imaging studies  CBC: Recent Labs  Lab 07/22/20 0411 07/23/20 0549 07/24/20 0500 07/25/20 0435 07/26/20 0509  WBC 6.9 7.9 8.4 8.9 8.6  NEUTROABS 3.4 6.0 4.9 6.1 4.8  HGB 8.7* 8.8* 9.0* 9.2* 9.8*  HCT 28.5* 28.2* 29.1* 29.4* 31.2*  MCV 101.4* 100.4* 102.1* 100.7* 101.0*  PLT 78* 97* 113* 131* 137*   Basic Metabolic Panel: Recent Labs  Lab 07/20/20 0420 07/21/20 0411 07/22/20 0411 07/23/20 0549 07/24/20 0500  NA 136 135 134* 138 136  K 4.0 3.8 3.7 4.0 3.7  CL 102 99 101 101 104  CO2 27 27 26 27 27   GLUCOSE 122* 174* 105* 123* 92  BUN 40* 38* 39* 41* 42*  CREATININE 1.14* 1.03* 1.04* 0.84 0.92  CALCIUM 8.5* 8.9 9.1 9.5 9.3  PHOS 1.8*  --  3.2  --   --    GFR: Estimated Creatinine Clearance: 63.7 mL/min (by C-G formula based on SCr of 0.92 mg/dL). Liver Function Tests: Recent Labs  Lab 07/20/20 0420  ALBUMIN 2.5*   No results for input(s): LIPASE, AMYLASE in the last 168 hours.  No results for input(s): AMMONIA in the last 168 hours.  Coagulation Profile: Recent Labs  Lab  07/20/20 1106  INR 1.2   Cardiac Enzymes: No results for input(s): CKTOTAL, CKMB, CKMBINDEX, TROPONINI in the last 168 hours.  BNP (last 3 results) No results for input(s): PROBNP in the last 8760 hours. HbA1C: No results for input(s): HGBA1C in the last 72 hours. CBG: No results for input(s): GLUCAP in the last 168 hours.  Lipid Profile: No results for input(s): CHOL, HDL, LDLCALC, TRIG, CHOLHDL, LDLDIRECT in the last 72 hours. Thyroid Function Tests: No results for input(s): TSH, T4TOTAL, FREET4, T3FREE, THYROIDAB in the last 72 hours. Anemia Panel: No results for input(s): VITAMINB12, FOLATE, FERRITIN, TIBC, IRON, RETICCTPCT in the last 72 hours. Sepsis Labs: No results for input(s): PROCALCITON, LATICACIDVEN in the last 168 hours.   Recent Results (from the past 240 hour(s))  Blood culture (single)     Status: Abnormal   Collection Time: 07/16/20  8:07 PM   Specimen: BLOOD  Result Value Ref Range Status   Specimen Description   Final    BLOOD LEFT ANTECUBITAL Performed at [facility], [address], [place], [place]    Special Requests   Final    BOTTLES DRAWN AEROBIC AND ANAEROBIC Blood Culture results may not be optimal due to an inadequate volume of blood received in culture bottles Performed at [facility], [address] [place], [place]    Culture  Setup Time   Final    GRAM POSITIVE COCCI IN BOTH AEROBIC AND ANAEROBIC BOTTLES CRITICAL RESULT CALLED TO, READ BACK BY AND VERIFIED WITH: DEVIN MITCHELL 1200 07/17/2020 [facility]    Culture (A)  Final    STAPHYLOCOCCUS EPIDERMIDIS STAPHYLOCOCCUS CAPITIS THE SIGNIFICANCE OF ISOLATING THIS ORGANISM FROM A SINGLE SET OF BLOOD CULTURES WHEN MULTIPLE SETS ARE DRAWN IS UNCERTAIN. PLEASE NOTIFY THE MICROBIOLOGY DEPARTMENT WITHIN ONE WEEK IF SPECIATION AND SENSITIVITIES ARE REQUIRED. Performed at [facility], [address] [place], [place]    Report Status 07/19/2020  FINAL  Final  Blood Culture ID Panel (Reflexed)  Status: Abnormal   Collection Time: 07/16/20  8:07 PM  Result Value Ref Range Status   Enterococcus faecalis NOT DETECTED NOT DETECTED Final   Enterococcus Faecium NOT DETECTED NOT DETECTED Final   Listeria monocytogenes NOT DETECTED NOT DETECTED Final   Staphylococcus species DETECTED (A) NOT DETECTED Final    Comment: CRITICAL RESULT CALLED TO, READ BACK BY AND VERIFIED WITH: DEVIN MITCHELL 1200 07/17/2020 DLB    Staphylococcus aureus (BCID) NOT DETECTED NOT DETECTED Final   Staphylococcus epidermidis DETECTED (A) NOT DETECTED Final    Comment: CRITICAL RESULT CALLED TO, READ BACK BY AND VERIFIED WITH: DEVIN MITCHELL 1200 07/17/2020 DLB    Staphylococcus lugdunensis NOT DETECTED NOT DETECTED Final   Streptococcus species NOT DETECTED NOT DETECTED Final   Streptococcus agalactiae NOT DETECTED NOT DETECTED Final   Streptococcus pneumoniae NOT DETECTED NOT DETECTED Final   Streptococcus pyogenes NOT DETECTED NOT DETECTED Final   A.calcoaceticus-baumannii NOT DETECTED NOT DETECTED Final   Bacteroides fragilis NOT DETECTED NOT DETECTED Final   Enterobacterales NOT DETECTED NOT DETECTED Final   Enterobacter cloacae complex NOT DETECTED NOT DETECTED Final   Escherichia coli NOT DETECTED NOT DETECTED Final   Klebsiella aerogenes NOT DETECTED NOT DETECTED Final   Klebsiella oxytoca NOT DETECTED NOT DETECTED Final   Klebsiella pneumoniae NOT DETECTED NOT DETECTED Final   Proteus species NOT DETECTED NOT DETECTED Final   Salmonella species NOT DETECTED NOT DETECTED Final   Serratia marcescens NOT DETECTED NOT DETECTED Final   Haemophilus influenzae NOT DETECTED NOT DETECTED Final   Neisseria meningitidis NOT DETECTED NOT DETECTED Final   Pseudomonas aeruginosa NOT DETECTED NOT DETECTED Final   Stenotrophomonas maltophilia NOT DETECTED NOT DETECTED Final   Candida albicans NOT DETECTED NOT DETECTED Final   Candida auris NOT DETECTED NOT  DETECTED Final   Candida glabrata NOT DETECTED NOT DETECTED Final   Candida krusei NOT DETECTED NOT DETECTED Final   Candida parapsilosis NOT DETECTED NOT DETECTED Final   Candida tropicalis NOT DETECTED NOT DETECTED Final   Cryptococcus neoformans/gattii NOT DETECTED NOT DETECTED Final   Methicillin resistance mecA/C NOT DETECTED NOT DETECTED Final    Comment: Performed at [facility], [address] [place], [place] [address]  Resp Panel by RT-PCR (Flu A&B, Covid) Nasopharyngeal Swab     Status: None   Collection Time: 07/16/20  8:16 PM   Specimen: Nasopharyngeal Swab; Nasopharyngeal(NP) swabs in vial transport medium  Result Value Ref Range Status   SARS Coronavirus 2 by RT PCR NEGATIVE NEGATIVE Final    Comment: (NOTE) SARS-CoV-2 target nucleic acids are NOT DETECTED.  The SARS-CoV-2 RNA is generally detectable in upper respiratory specimens during the acute phase of infection. The lowest concentration of SARS-CoV-2 viral copies this assay can detect is 138 copies/mL. A negative result does not preclude SARS-Cov-2 infection and should not be used as the sole basis for treatment or other patient management decisions. A negative result may occur with  improper specimen collection/handling, submission of specimen other than nasopharyngeal swab, presence of viral mutation(s) within the areas targeted by this assay, and inadequate number of viral copies(<138 copies/mL). A negative result must be combined with clinical observations, patient history, and epidemiological information. The expected result is Negative.  Fact Sheet for Patients:  BloggerCourse.com  Fact Sheet for Healthcare Providers:  SeriousBroker.it  This test is no t yet approved or cleared by the [place] FDA and  has been authorized for detection and/or diagnosis of SARS-CoV-2 by FDA under an Emergency Use  Authorization (EUA). This EUA will remain   in effect (meaning this test can be used) for the duration of the COVID-19 declaration under Section 564(b)(1) of the Act, 21 U.S.C.section 360bbb-3(b)(1), unless the authorization is terminated  or revoked sooner.       Influenza A by PCR NEGATIVE NEGATIVE Final   Influenza B by PCR NEGATIVE NEGATIVE Final    Comment: (NOTE) The Xpert Xpress SARS-CoV-2/FLU/RSV plus assay is intended as an aid in the diagnosis of influenza from Nasopharyngeal swab specimens and should not be used as a sole basis for treatment. Nasal washings and aspirates are unacceptable for Xpert Xpress SARS-CoV-2/FLU/RSV testing.  Fact Sheet for Patients: BloggerCourse.com  Fact Sheet for Healthcare Providers: SeriousBroker.it  This test is not yet approved or cleared by the [place] FDA and has been authorized for detection and/or diagnosis of SARS-CoV-2 by FDA under an Emergency Use Authorization (EUA). This EUA will remain in effect (meaning this test can be used) for the duration of the COVID-19 declaration under Section 564(b)(1) of the Act, 21 U.S.C. section 360bbb-3(b)(1), unless the authorization is terminated or revoked.  Performed at [facility], [address] [place], [place] [address]   MRSA PCR Screening     Status: None   Collection Time: 07/18/20 12:38 AM   Specimen: Nasal Mucosa; Nasopharyngeal  Result Value Ref Range Status   MRSA by PCR NEGATIVE NEGATIVE Final    Comment:        The GeneXpert MRSA Assay (FDA approved for NASAL specimens only), is one component of a comprehensive MRSA colonization surveillance program. It is not intended to diagnose MRSA infection nor to guide or monitor treatment for MRSA infections. Performed at [facility], [address] [place], [place] [address]   CULTURE, BLOOD (ROUTINE X 2) w Reflex to ID Panel     Status: None   Collection Time: 07/18/20  3:13 PM    Specimen: BLOOD  Result Value Ref Range Status   Specimen Description BLOOD BLOOD RIGHT FOREARM  Final   Special Requests   Final    BOTTLES DRAWN AEROBIC AND ANAEROBIC Blood Culture adequate volume   Culture   Final    NO GROWTH 5 DAYS Performed at [facility], [address] [place], [place] [address]    Report Status 07/23/2020 FINAL  Final  CULTURE, BLOOD (ROUTINE X 2) w Reflex to ID Panel     Status: None   Collection Time: 07/18/20  3:17 PM   Specimen: BLOOD  Result Value Ref Range Status   Specimen Description BLOOD BLOOD RIGHT HAND  Final   Special Requests   Final    BOTTLES DRAWN AEROBIC AND ANAEROBIC Blood Culture adequate volume   Culture   Final    NO GROWTH 5 DAYS Performed at [facility], [address] [place], [place] [address]    Report Status 07/23/2020 FINAL  Final     Radiology Studies: No results found.  Scheduled Meds:   stroke: mapping our early stages of recovery book   Does not apply Once   aspirin EC  81 mg Oral Daily   budesonide (PULMICORT) nebulizer solution  0.5 mg Nebulization BID   Chlorhexidine Gluconate Cloth  6 each Topical Q0600   feeding supplement  237 mL Oral BID BM   folic acid  1 mg Oral Daily   furosemide  40 mg Oral Daily   ipratropium-albuterol  3 mL Nebulization BID   mouth rinse  15 mL  Mouth Rinse BID   nicotine  21 mg Transdermal Daily   polyethylene glycol  17 g Oral Daily   predniSONE  40 mg Oral Q breakfast   senna-docusate  1 tablet Oral BID   sodium chloride flush  3 mL Intravenous Q12H   Continuous Infusions:     LOS: 9 days   Time spent: 35 minutes with >50% on coc  Lynn Ito, MD [facility]  If 7PM-7AM, please contact night-coverage [email or web address]  07/26/2020, 10:48 AM

## 2020-07-26 NOTE — Progress Notes (Signed)
Attempted to place patient on cpap, patient unable to tolerate. She ask me to take it off, no distress noted at this time.

## 2020-07-26 NOTE — Progress Notes (Signed)
PT Cancellation Note  Patient Details Name: Brandi Holland MRN: [id number] DOB: August 16, 1965   Cancelled Treatment:     PT attempt. 3rd attempt today. Pt reports not feeling well currently requesting therapy return tomorrow. First attempt, pt was eating breakfast. 2nd attempt, pt was visiting with father that she has not seen in years. Third attempt pt too fatigue. Acute PT will continue to follow and progress as able per POC.   Rushie Chestnut 07/26/2020, 3:48 PM

## 2020-07-26 NOTE — Progress Notes (Signed)
Progress Note  Patient Name: Brandi Holland Date of Encounter: 07/26/2020  [facility] Cardiologist: Dr. Kirke Corin  Subjective   Patient denies chest pain, breathing good. Neurological status the same. Tele with no afib/flutter.   Inpatient Medications    Scheduled Meds:   stroke: mapping our early stages of recovery book   Does not apply Once   aspirin EC  81 mg Oral Daily   budesonide (PULMICORT) nebulizer solution  0.5 mg Nebulization BID   Chlorhexidine Gluconate Cloth  6 each Topical Q0600   feeding supplement  237 mL Oral BID BM   folic acid  1 mg Oral Daily   furosemide  40 mg Oral Daily   ipratropium-albuterol  3 mL Nebulization BID   mouth rinse  15 mL Mouth Rinse BID   nicotine  21 mg Transdermal Daily   polyethylene glycol  17 g Oral Daily   predniSONE  40 mg Oral Q breakfast   senna-docusate  1 tablet Oral BID   sodium chloride flush  3 mL Intravenous Q12H   Continuous Infusions:  PRN Meds: acetaminophen **OR** acetaminophen, bisacodyl, ipratropium-albuterol, LORazepam   Vital Signs    Vitals:   07/25/20 2009 07/26/20 0015 07/26/20 0454 07/26/20 0456  BP:  120/66 139/71   Pulse:  87 93   Resp:  18 20   Temp:  98.3 F (36.8 C) (!) 97.5 F (36.4 C)   TempSrc:  Oral Oral   SpO2: 95% 94% 93%   Weight:    89.9 kg  Height:        Intake/Output Summary (Last 24 hours) at 07/26/2020 0727 Last data filed at 07/26/2020 0654 Gross per 24 hour  Intake 243 ml  Output 500 ml  Net -257 ml   Last 3 Weights 07/26/2020 07/24/2020 07/22/2020  Weight (lbs) 198 lb 4.8 oz 193 lb 2 oz 202 lb 2.6 oz  Weight (kg) 89.948 kg 87.6 kg 91.7 kg      Telemetry    NSR, HR 90-100 - Personally Reviewed  ECG    No new - Personally Reviewed  Physical Exam   GEN: No acute distress.   Neck: No JVD Cardiac: RRR, + murmur, rubs, or gallops.  Respiratory: Clear to auscultation bilaterally. GI: Soft, nontender, non-distended  MS: No edema; No deformity. Neuro:  Nonfocal   Psych: Normal affect   Labs    High Sensitivity Troponin:   Recent Labs  Lab 07/16/20 2016 07/17/20 0534 07/17/20 0901 07/17/20 1227  TROPONINIHS 708* 869* 846* 904*      Chemistry Recent Labs  Lab 07/20/20 0420 07/21/20 0411 07/22/20 0411 07/23/20 0549 07/24/20 0500  NA 136   < > 134* 138 136  K 4.0   < > 3.7 4.0 3.7  CL 102   < > 101 101 104  CO2 27   < > 26 27 27   GLUCOSE 122*   < > 105* 123* 92  BUN 40*   < > 39* 41* 42*  CREATININE 1.14*   < > 1.04* 0.84 0.92  CALCIUM 8.5*   < > 9.1 9.5 9.3  ALBUMIN 2.5*  --   --   --   --   GFRNONAA 56*   < > >60 >60 >60  ANIONGAP 7   < > 7 10 5    < > = values in this interval not displayed.     Hematology Recent Labs  Lab 07/24/20 0500 07/25/20 0435 07/26/20 0509  WBC 8.4 8.9 8.6  RBC  2.85* 2.92* 3.09*  HGB 9.0* 9.2* 9.8*  HCT 29.1* 29.4* 31.2*  MCV 102.1* 100.7* 101.0*  MCH 31.6 31.5 31.7  MCHC 30.9 31.3 31.4  RDW 22.4* 21.2* 20.9*  PLT 113* 131* 137*    BNPNo results for input(s): BNP, PROBNP in the last 168 hours.   DDimer No results for input(s): DDIMER in the last 168 hours.   Radiology    No results found.  Cardiac Studies   Echo 07/17/2020: 1. Left ventricular ejection fraction, by estimation, is 50 to 55%. The  left ventricle has low normal function. The left ventricle has no regional  wall motion abnormalities. Left ventricular diastolic parameters are  indeterminate.   2. Right ventricular systolic function is normal. The right ventricular  size is normal. Tricuspid regurgitation signal is inadequate for assessing  PA pressure.   3. Left atrial size was mildly dilated.   4. Right atrial size was mildly dilated.   5. The mitral valve is normal in structure. No evidence of mitral valve  regurgitation. No evidence of mitral stenosis.   6. The aortic valve is normal in structure. Aortic valve regurgitation is  mild. Mild aortic valve stenosis.   7. The inferior vena cava is dilated in size with  <50% respiratory  variability, suggesting right atrial pressure of 15 mmHg.  Patient Profile     55 y.o. female with pmh of COPD, smoker, OSA, asthma who presented with respiratory failure, diagnosed with CVA.  Assessment & Plan   Acute Cryptogenic embolic stroke/AMS - unclear reason for AMS, infection vs CVA however no obvious source of infection. Also had AKI with Scr up to 3.15 - DVT study negative - TEE without cardioembolic source - TEE with sever AI, otherwise normal - Tele with no Afib/flutter, however will need heart monitor at discharge, she is awaiting rehab placement - BC positive, however suspected contaminate - Mental status is improving - per neurology/IM   HFpEF - Normal EF by echo this admission - euvolemic on exam - was previously on IV lasix>>continue lasix 40 mg daily   Respiratory failure/COPD/tobacco use - off O2 - elevated Ddimer but unable to obtain CTA due to AKI, VQ scan canceled due to recurrent desat - s/p IV lasix - breathing normal   Severe AR - per TTE showed severe AI - Question of nonbacterial thrombotic endocarditis, however TEE 07/23/20 without thrombus - Further work-up by structural team/CTS as OP - per MD plan   Elevated troponin - further work-up as outpatient.    Anemia/thrombocytopenia - s/p 3unit PRBCs and IV iron - on prednisone - heme/onc following  For questions or updates, please contact [facility] Please consult [email or web address] for contact info under        Signed, Vanya Carberry David Stall, PA-C  07/26/2020, 7:27 AM

## 2020-07-27 LAB — BASIC METABOLIC PANEL
Anion gap: 7 (ref 5–15)
BUN: 34 mg/dL — ABNORMAL HIGH (ref 6–20)
CO2: 27 mmol/L (ref 22–32)
Calcium: 9.1 mg/dL (ref 8.9–10.3)
Chloride: 102 mmol/L (ref 98–111)
Creatinine, Ser: 0.79 mg/dL (ref 0.44–1.00)
GFR, Estimated: >60 mL/min (ref >60)
Glucose, Bld: 91 mg/dL (ref 70–99)
Potassium: 3.9 mmol/L (ref 3.5–5.1)
Sodium: 136 mmol/L (ref 135–145)

## 2020-07-27 NOTE — Progress Notes (Signed)
Physical Therapy Treatment Patient Details Name: Brandi Holland MRN: [id number] DOB: [birth year] Today's Date: 07/27/2020    History of Present Illness Pt is a 55 y/o F with PMH: unremarkable due to noncompliance with medical care. Pt presented to ED with 2-week history of feeling unwell/acting confused. Pt was seen at an OSH for concern for fluid overload with CHF as differentials. W/u at [facility] included: MRI showing multiple embolic looking infarctions in bilateral hemispheres concerning for a central embolic source. 2D Echo: unlikely HF, but does show left and right atrial enlargement. Blood cultures + for staph epi. Pt adm for encephalopathy with CVAs and ICU d/t respiratory failure.    PT Comments    Pt appears fearful upon entering room.  "I don't understand."  Husband in stating she seems to be "about the same" cognition wise.  Pt is HOH which does seem to affect some understanding.  She is initially hesitant to participate "I don't know you."  Time spent talking with pt and making her more comfortable.  She is able to get to EOB with rails and supervision.  Generally steady in sitting.  Stood with min a x 1 and is able to transfer to/from recliner with min a x 1.  Cues for safety and walker position.  She does practice extended static standing and exercises both sitting and standing.  She is hesitant to remain up in chair today and is assisted back to bed.  She stated she does not like to be left in chair alone.  Husband in and stated she has trouble understanding she needs to wait for staff and they cannot come instantly when called.  She is generally anxious during session.  HR and O2 sats WFL and stable during session but does have an increased respiratory rate.  She is assisted back to bed per her request.  Pt stated "I did not do well."  Encouragement provided.    Pt may do better with consistent therapy staff and will accommodate as schedules/staffing allow.   Follow Up Recommendations   CIR     Equipment Recommendations  Rolling walker with 5" wheels    Recommendations for Other Services       Precautions / Restrictions Precautions Precautions: Fall Restrictions Weight Bearing Restrictions: No    Mobility  Bed Mobility   Bed Mobility: Supine to Sit;Sit to Supine Rolling: Supervision   Supine to sit: Supervision;HOB elevated Sit to supine: Mod assist   General bed mobility comments: Mod A to get LE's back onto bed    Transfers Overall transfer level: Needs assistance Equipment used: Rolling walker (2 wheeled) Transfers: Sit to/from Stand Sit to Stand: Min assist            Ambulation/Gait Ambulation/Gait assistance: Min Chemical engineer (Feet): 3 Feet Assistive device: Rolling walker (2 wheeled) Gait Pattern/deviations: Step-to pattern Gait velocity: decreased   General Gait Details: General anxiety prevented gait progression today.  Session focused on standing, transfers and ex.   Stairs             Wheelchair Mobility    Modified Rankin (Stroke Patients Only)       Balance Overall balance assessment: Needs assistance Sitting-balance support: Bilateral upper extremity supported;Feet supported Sitting balance-Leahy Scale: Fair     Standing balance support: Bilateral upper extremity supported Standing balance-Leahy Scale: Poor  Cognition Arousal/Alertness: Awake/alert Behavior During Therapy: Anxious Overall Cognitive Status: Impaired/Different from baseline                                 General Comments: has trouble hearing and affects following commands, general anxiety today      Exercises      General Comments        Pertinent Vitals/Pain Pain Assessment: No/denies pain    Home Living                      Prior Function            PT Goals (current goals can now be found in the care plan section) Progress towards PT goals:  Progressing toward goals    Frequency    7X/week      PT Plan Current plan remains appropriate    Co-evaluation              AM-PAC PT "6 Clicks" Mobility   Outcome Measure  Help needed turning from your back to your side while in a flat bed without using bedrails?: A Little Help needed moving from lying on your back to sitting on the side of a flat bed without using bedrails?: A Little Help needed moving to and from a bed to a chair (including a wheelchair)?: A Lot Help needed standing up from a chair using your arms (e.g., wheelchair or bedside chair)?: A Lot Help needed to walk in hospital room?: A Lot Help needed climbing 3-5 steps with a railing? : Total 6 Click Score: 13    End of Session Equipment Utilized During Treatment: Gait belt Activity Tolerance: Patient tolerated treatment well Patient left: in chair;with chair alarm set;with call bell/phone within reach Nurse Communication: Mobility status PT Visit Diagnosis: Muscle weakness (generalized) (M62.81);Difficulty in walking, not elsewhere classified (R26.2);Apraxia (R48.2)     Time: 1443-1540 PT Time Calculation (min) (ACUTE ONLY): 28 min  Charges:  $Therapeutic Exercise: 8-22 mins $Therapeutic Activity: 8-22 mins                     {Lanaya Bennis, PTA 07/27/20, 12:14 PM , 12:08 PM

## 2020-07-27 NOTE — Progress Notes (Addendum)
Physician Attestation  Patient seen and examined, agree with detailed note by PA/NP unless otherwise stated in my note.    Patient presentation and plan discussed on rounds.       EKG lab work, chest x-ray,  and/or echocardiogram were reviewed independently by myself     Patient profile/history:  55 year old female with history of COPD, OSA, smoker, presenting with respiratory failure, diagnosed with frontal CVA and severe aortic regurgitation     Feels okay, no acute events overnight, husband at bedside.  Refused to work with physical therapy yesterday as per husband.    Physical exam  GEN: No acute distress.    Neck: No JVD  Cardiac: RRR, no murmurs  Respiratory: Diminished breath sounds at bases  GI: Soft, nontender, non-distended  MS: No edema; No deformity.  Neuro: Left arm and left leg weakness noted  Psych: Normal affect     A/P:  1.  Severe aortic regurgitation  -Follow-up with CT surgery as outpatient. -Continue Lasix 40 mg daily  -EF preserved     2. Acute frontal CVA  -No evidence of A. fib so far  -cardiac monitor upon discharge.      3.  Respiratory failure, COPD  -Overall improved, satting well , currently on room air -Management as per primary team     Greater than 50% was spent in counseling and coordination of care with patient  Total encounter time 35 minutes or more        Signed:  Debbe Odea, M.D.  [facility]         Progress Note  Patient Name: Brandi Holland Date of Encounter: 07/27/2020  [facility] Cardiologist: Dr. Kirke Corin  Subjective   Overall patient is doing well, still has some confusion. She denies chest pain. Slept well overnight.   Inpatient Medications    Scheduled Meds:   stroke: mapping our early stages of recovery book   Does not apply Once   aspirin EC  81 mg Oral Daily   budesonide (PULMICORT) nebulizer solution  0.5 mg Nebulization BID   Chlorhexidine Gluconate Cloth  6 each Topical Q0600   feeding  supplement  237 mL Oral BID BM   folic acid  1 mg Oral Daily   furosemide  40 mg Oral Daily   ipratropium-albuterol  3 mL Nebulization BID   mouth rinse  15 mL Mouth Rinse BID   nicotine  21 mg Transdermal Daily   polyethylene glycol  17 g Oral Daily   predniSONE  20 mg Oral Q breakfast   senna-docusate  1 tablet Oral BID   sodium chloride flush  3 mL Intravenous Q12H   Continuous Infusions:  PRN Meds: acetaminophen **OR** acetaminophen, bisacodyl, ipratropium-albuterol, LORazepam   Vital Signs    Vitals:   07/26/20 2011 07/26/20 2023 07/27/20 0500 07/27/20 0510  BP: 119/63   (!) 92/43  Pulse: 100   79  Resp: 18   18  Temp: 99.9 F (37.7 C)   98.4 F (36.9 C)  TempSrc: Oral   Oral  SpO2: 96% 95%  96%  Weight:   89 kg   Height:        Intake/Output Summary (Last 24 hours) at 07/27/2020 0716 Last data filed at 07/26/2020 1833 Gross per 24 hour  Intake 240 ml  Output 600 ml  Net -360 ml   Last 3 Weights 07/27/2020 07/26/2020 07/24/2020  Weight (lbs) 196 lb 3.4 oz 198 lb 4.8 oz 193 lb 2 oz  Weight (kg)  89 kg 89.948 kg 87.6 kg      Telemetry    SR/St, Hr 90-120 - Personally Reviewed  ECG    No new- Personally Reviewed  Physical Exam   GEN: No acute distress.   Neck: No JVD Cardiac: RRR, + murmur, no rubs, or gallops.  Respiratory: Clear to auscultation bilaterally. GI: Soft, nontender, non-distended  MS: No edema; No deformity. Neuro:  Nonfocal  Psych: Normal affect   Labs    High Sensitivity Troponin:   Recent Labs  Lab 07/16/20 2016 07/17/20 0534 07/17/20 0901 07/17/20 1227  TROPONINIHS 708* 869* 846* 904*      Chemistry Recent Labs  Lab 07/22/20 0411 07/23/20 0549 07/24/20 0500  NA 134* 138 136  K 3.7 4.0 3.7  CL 101 101 104  CO2 26 27 27   GLUCOSE 105* 123* 92  BUN 39* 41* 42*  CREATININE 1.04* 0.84 0.92  CALCIUM 9.1 9.5 9.3  GFRNONAA >60 >60 >60  ANIONGAP 7 10 5      Hematology Recent Labs  Lab 07/24/20 0500 07/25/20 0435  07/26/20 0509  WBC 8.4 8.9 8.6  RBC 2.85* 2.92* 3.09*  HGB 9.0* 9.2* 9.8*  HCT 29.1* 29.4* 31.2*  MCV 102.1* 100.7* 101.0*  MCH 31.6 31.5 31.7  MCHC 30.9 31.3 31.4  RDW 22.4* 21.2* 20.9*  PLT 113* 131* 137*    BNP Recent Labs  Lab 07/26/20 0509  BNP 294.1*     DDimer No results for input(s): DDIMER in the last 168 hours.   Radiology    No results found.  Cardiac Studies   Echo 07/17/2020: 1. Left ventricular ejection fraction, by estimation, is 50 to 55%. The  left ventricle has low normal function. The left ventricle has no regional  wall motion abnormalities. Left ventricular diastolic parameters are  indeterminate.   2. Right ventricular systolic function is normal. The right ventricular  size is normal. Tricuspid regurgitation signal is inadequate for assessing  PA pressure.   3. Left atrial size was mildly dilated.   4. Right atrial size was mildly dilated.   5. The mitral valve is normal in structure. No evidence of mitral valve  regurgitation. No evidence of mitral stenosis.   6. The aortic valve is normal in structure. Aortic valve regurgitation is  mild. Mild aortic valve stenosis.   7. The inferior vena cava is dilated in size with <50% respiratory  variability, suggesting right atrial pressure of 15 mmHg.  Patient Profile     55 y.o. female pmh of COPD, smoker, OSA, asthma who presented with respiratory failure, diagnosed with CVA.  Assessment & Plan    Acute Cryptogenic embolic stroke/AMS - unclear reason for AMS, infection vs CVA however no obvious source of infection. Also had AKI with Scr up to 3.15 - DVT study negative - TEE without cardioembolic source - TEE with sever AI, otherwise normal - Tele with no Afib/flutter - BC positive, however suspected contaminate - Mental status improved - Plan for heart monitor at discharge - No a/c due to anemia and thrombocytopenia - per neurology/IM   HFpEF - Normal EF by echo this admission -  euvolemic on exam - continue lasix 40 mg daily   Respiratory failure/COPD/tobacco use - off O2 - elevated Ddimer but unable to obtain CTA due to AKI, VQ scan canceled due to recurrent desat - s/p IV lasix - breathing normal   Severe AR - TTE showed severe AI - Question of nonbacterial thrombotic endocarditis, however TEE 07/23/20 without thrombus -  Further work-up by structural team/CTS as OP - per MD plan   Elevated troponin - further work-up as outpatient   Anemia/thrombocytopenia - s/p 3unit PRBCs and IV iron - on prednisone - heme/onc was following  For questions or updates, please contact [facility] Please consult [email or web address] for contact info under        Signed, Cadence David Stall, PA-C  07/27/2020, 7:16 AM

## 2020-07-27 NOTE — Plan of Care (Addendum)
Patient had an uneventful shift. No changes in neurological and neurovascular assessments. No complaints of SOB, maintaining adequate SPO2 on RA.Denies any needs at this time. All Safety measures maintained. Care continues.  Problem: Education: Goal: Knowledge of General Education information will improve Description: Including pain rating scale, medication(s)/side effects and non-pharmacologic comfort measures Outcome: Progressing   Problem: Health Behavior/Discharge Planning: Goal: Ability to manage health-related needs will improve Outcome: Progressing   Problem: Clinical Measurements: Goal: Ability to maintain clinical measurements within normal limits will improve Outcome: Progressing Goal: Will remain free from infection Outcome: Progressing Goal: Diagnostic test results will improve Outcome: Progressing Goal: Respiratory complications will improve Outcome: Progressing Goal: Cardiovascular complication will be avoided Outcome: Progressing   Problem: Activity: Goal: Risk for activity intolerance will decrease Outcome: Progressing   Problem: Nutrition: Goal: Adequate nutrition will be maintained Outcome: Progressing   Problem: Coping: Goal: Level of anxiety will decrease Outcome: Progressing   Problem: Elimination: Goal: Will not experience complications related to bowel motility Outcome: Progressing Goal: Will not experience complications related to urinary retention Outcome: Progressing   Problem: Pain Managment: Goal: General experience of comfort will improve Outcome: Progressing   Problem: Safety: Goal: Ability to remain free from injury will improve Outcome: Progressing   Problem: Skin Integrity: Goal: Risk for impaired skin integrity will decrease Outcome: Progressing

## 2020-07-27 NOTE — Progress Notes (Signed)
PROGRESS NOTE    Brandi Holland  ZWC:[id number] DOB: 27-Mar-1965 DOA: 07/16/2020 PCP: Pcp, No   Brief Narrative: Taken from H&P.  Brandi Holland is a 55 y.o. female with medical history significant of asthma, OSA, fibromyalgia who presents via EMS for shortness of breath.  She was hypoxic in 60s requiring 5 to 6 L of oxygen with intermittent nonrebreather. Initially admitting provider unable to obtained any history as there was no family member at bedside, no one could be reached on phone and patient was very altered.  Able to get hold of husband after multiple attempts at bedside, according to him patient was not feeling well for the past few weeks, she was admitted at [facility] on 06/18/2020 with lower extremity edema and was diuresed and discharged on 06/20/2020.  No echocardiogram done, CTA was negative for PE at that time, no significant abnormality on labs except sodium of 122 which improved to 130 and mild AKI, unknown baseline. Patient continued to feel bad and she was staying in bed for the past more than 2 weeks, unable to get out of bed, very poor p.o. intake.  Per husband she did had diarrhea with black color stool and he has seen some blood in stool last week, diarrhea lasted for 2 days and then resolved.  He did not noticed any hematemesis.  Patient does not drink alcohol but is a heavy smoker, smokes at least 2 packs/day.  Husband was able to brought her to ED 2 weeks ago and then she left after waiting couple of hours without being seen.  Per husband she was refusing to go see a doctor, when she continued to deteriorate he asked her son who lives in [place] who drove from there and talked with her and called the EMS. Husband denies any recent upper respiratory illnesses, fever or chills.  He did noticed worsening shortness of breath.  Patient was mostly bedbound for the past 2 weeks, he was using diapers to help her as she was unable to go to the bathroom by herself due to profound  weakness.  Patient remained very altered with intermittent desaturation, ABG with some respiratory alkalosis, no hypoxia.1/2 blood culture bottles with MSSE, MRI brain with multiple bilateral infarcts, worsening renal function so unable to obtain CTA, VQ scan ordered but got canceled due to recurrent desaturation.  D-dimer at 2.6, hemoglobin of 5.7, platelet counts of 97 this morning and there were 255 during recent hospitalization at [facility], BUN of 73 and creatinine of 2.99, sodium of 130, chloride of 96 and bicarb of 21.  Elevated T bili at 2.5 and INR of 1.5, lactic acidosis 3.2>>2.5>>1.9, elevated troponin 708>>904, BNP at 1893, CT chest and abdomen with bilateral groundglass opacities which can be due to pulmonary edema versus atypical pneumonia and no significant abnormality on CT abdomen.  Echocardiogram with EF of 50 to 55%, which is within lower normal limit, no wall motion abnormalities and indeterminate diastolic parameters, mildly biatrial dilatation and dilated inferior vena cava with less than 50% variability. Received total of 3 units PRBC 6/3:Had swallow evaluation today and they are recommending dysphagia 2 diet. 6/4: Cardiology started her on diuresis with IV Lasix 40 mg daily, some improvement in breathing status. 6/5: Significant improvement in breathing status, worsening thrombocytopenia, started on prednisone 6/6: No new complaint, platelets started improving. 6/7: TEE got canceled again as patient was difficult to arouse this morning, apparently was very agitated overnight and received a dose of Ativan, we will attempt again tomorrow.  6/8-Plan for TEE today. Per husband her MS is improving. Pt hard of hearing. 6/9-TEE no thrombus or pfo. Pt has no complaints .no BM for couple of days 6/10-MS continues to improvement. Husband at bedside. No complaints of chest pain, shortness of breath or nausea 6/11- no overnight issues.  6/12 off 02  at rest, getting PT  today   Subjective: Denies shortness of breath, chest pain, dizziness  Assessment & Plan:   Principal Problem:   Acute encephalopathy Active Problems:   Stroke (cerebrum) (HCC)   Asthma   Acute respiratory failure with hypoxia (HCC)   AKI (acute kidney injury) (HCC)   Hyponatremia   OSA (obstructive sleep apnea)   AMS (altered mental status)   Thrombocytopenia (HCC)   Acute on chronic diastolic heart failure (HCC)   Folate deficiency   Hyperbilirubinemia   SOB (shortness of breath)   Aortic valve regurgitation   Acute ITP (HCC)  Severe acute encephalopathy.  Resolved  Unknown etiology and can be multifactorial with bilateral strokes. No obvious source of infection except 1 out of 2 blood culture with MSSE which is most likely a contaminant, procalcitonin at 0.35>>0.27, concern of cardiac embolic source which can be due to endocarditis but less likely with negative blood cultures . 6/12 MS improved  Neurology was following and now has as needed     Acute hypoxic respiratory failure.  Improving, currently on 2 L of oxygen  Patient has an history of heavy smoking but no underlying diagnosis of COPD, not on home oxygen.  Initially required nonrebreather and then later switched to heated high flow. Might be due to encephalopathy versus pulmonary edema.  Elevated D-dimer but unable to obtain CTA due to AKI, VQ scan got canceled due to recurrent desaturation. Received iv lasix, now on po BNP lower than it was  6/12 improved.  On room air now  Continue Lasix p.o.       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  Hemoglobin and platelet started improving, she was started on prednisone with improving of platelets slowly. 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  Might have GI bleed as husband was mentioning about dark color stools while she was having diarrhea for 2 days last  week.  No recurrence at this time. Anemia panel with iron and folate deficiency.  Patient received 1 unit while in the ED, and 2 L next day. Received IV iron X 2 doses.  Hematology is involved-appreciate their help.  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  Rest of the labs pending. -Continue prednisone 60 mg daily-hematology to determine the duration.  CT abdomen with no concern of cirrhosis. 6/9 hemoglobin stable.  Platelets improving with prednisone.   Hematology following input was appreciated.  Since platelets improving recommend tapering 40 mg once a day starting today, taper further  on outpatient basis based on platelets count.  L DRV VT-negative for lupus anticoagulation.  Do not suspect any inhibitors related factors.  Recommended antiphospholipid antibody panel.   No evidence of hemolysis/schistocytes.  Iron studies inconclusive of iron deficiency.   6/11 hematology following  Antiphospholipid panel negative  Prednisone 60 mg a day was started on 6/5 On prednisone taper, now on 20 mg daily and will be tapered as outpatient basis        Severe AI- found on TEE. Will need CT surgery follow-up as outpatient  CTS office has been notified and aware  Cardiology following  Continue  Lasix 40 mg daily      HFpEF/Elevated troponin.  No prior history of heart failure. EF 50-55% on echo with diastolic dysfunction  continue Lasix  Cardiology following  I's and O's   Bilateral multifocal acute cerebral infarct.  Most likely secondary to cardiac emboli, TTE without any obvious source. MRA without any significant stenosis Extremity Doppler scans were negative for any DVT, 1 small acute superficial thrombophlebitis in upper extremity. Neurology following No anticoagulation at this time due to severe anemia and thrombocytopenia. 6/9 TEE was completed on 6/8.  No thrombus noted.  No PFO. will need cardiac monitor upon discharge i.e. possible Linq      Constipation- Continue bowel regimen    Hypophosphatemia. Replaced and resolved   Hypoalbuminemia.  Causing some third spacing. Received albumin for hypoalbuminemia and to improve diuresis and third spacing. Albumin was d/c'd.     1/2 blood cultures with MSSE.  Most likely a contaminant.  Patient is afebrile with no leukocytosis, borderline procalcitonin.  Patient received 1 dose of cefepime and 2 doses of vancomycin earlier during admission.  And received 5 days of cefazolin.  No need for more antibiotics at this time. -Repeat blood culture -negative. -TTE without any significant abnormality D felt the staph epidermidis and staph capitis in 1 set of bcx are contaminents and to dc abx. ID signed off  Hyponatremia.  Resolved Sodium was 122 during recent admission at [facility] earlier in May  Most likely secondary to poor p.o. intake and volume overload.    AKI.  Improved  Unknown baseline, continue to improve, at 1.04 today.  It was 1.2 during her recent admission at [facility].  Renal ultrasound without any significant abnormality.  Foley was placed for strict intake and output. -Monitor renal function -Avoid nephrotoxins  Stage III obesity. Estimated body mass index is 42.46 kg/m as calculated from the following:   Height as of this encounter: 4\' 9"  (1.448 m).   Weight as of this encounter: 89 kg.  This will complicate overall prognosis.  Objective: Vitals:   07/27/20 0500 07/27/20 0510 07/27/20 0729 07/27/20 0752  BP:  (!) 92/43  125/79  Pulse:  79  97  Resp:  18  20  Temp:  98.4 F (36.9 C)  98.5 F (36.9 C)  TempSrc:  Oral  Oral  SpO2:  96% 95% 92%  Weight: 89 kg     Height:        Intake/Output Summary (Last 24 hours) at 07/27/2020 0800 Last data filed at 07/26/2020 1833 Gross per 24 hour  Intake 240 ml  Output 600 ml  Net -360 ml    Filed Weights   07/24/20 0307 07/26/20 0456 07/27/20 0500  Weight: 87.6 kg 89.9 kg 89 kg    Examination: NAD,  calm CTA no wheeze rales rhonchi's Regular S1-S2 no gallops Soft benign positive bowel sounds No edema Awake and alert, mood and affect appropriate in current setting  DVT prophylaxis: SCDs, patient has severe anemia and thrombocytopenia. Code Status: Full Family Communication: None at bedside  Status is: Inpatient  Remains inpatient appropriate because:unsafe discharge  Dispo: The patient is from: Home              Anticipated d/c is to: CIR-insurance authorization/bed pending              Patient currently pt is medically stable for discharge   Difficult to place patient No  Level of care: Med-Surg  Disposition: CIR bed pending, case mx working on it if you have if you discharge  Consultants:  Neurology Cardiology ID Hematology  Procedures:  Antimicrobials:   Data Reviewed: I have personally reviewed following labs and imaging studies  CBC: Recent Labs  Lab 07/22/20 0411 07/23/20 0549 07/24/20 0500 07/25/20 0435 07/26/20 0509  WBC 6.9 7.9 8.4 8.9 8.6  NEUTROABS 3.4 6.0 4.9 6.1 4.8  HGB 8.7* 8.8* 9.0* 9.2* 9.8*  HCT 28.5* 28.2* 29.1* 29.4* 31.2*  MCV 101.4* 100.4* 102.1* 100.7* 101.0*  PLT 78* 97* 113* 131* 137*   Basic Metabolic Panel: Recent Labs  Lab 07/21/20 0411 07/22/20 0411 07/23/20 0549 07/24/20 0500  NA 135 134* 138 136  K 3.8 3.7 4.0 3.7  CL 99 101 101 104  CO2 27 26 27 27   GLUCOSE 174* 105* 123* 92  BUN 38* 39* 41* 42*  CREATININE 1.03* 1.04* 0.84 0.92  CALCIUM 8.9 9.1 9.5 9.3  PHOS  --  3.2  --   --    GFR: Estimated Creatinine Clearance: 63.4 mL/min (by C-G formula based on SCr of 0.92 mg/dL). Liver Function Tests: No results for input(s): AST, ALT, ALKPHOS, BILITOT, PROT, ALBUMIN in the last 168 hours.  No results for input(s): LIPASE, AMYLASE in the last 168 hours.  No results for input(s): AMMONIA in the last 168 hours.  Coagulation Profile: Recent Labs  Lab 07/20/20 1106  INR 1.2   Cardiac Enzymes: No  results for input(s): CKTOTAL, CKMB, CKMBINDEX, TROPONINI in the last 168 hours.  BNP (last 3 results) No results for input(s): PROBNP in the last 8760 hours. HbA1C: No results for input(s): HGBA1C in the last 72 hours. CBG: No results for input(s): GLUCAP in the last 168 hours.  Lipid Profile: No results for input(s): CHOL, HDL, LDLCALC, TRIG, CHOLHDL, LDLDIRECT in the last 72 hours. Thyroid Function Tests: No results for input(s): TSH, T4TOTAL, FREET4, T3FREE, THYROIDAB in the last 72 hours. Anemia Panel: No results for input(s): VITAMINB12, FOLATE, FERRITIN, TIBC, IRON, RETICCTPCT in the last 72 hours. Sepsis Labs: No results for input(s): PROCALCITON, LATICACIDVEN in the last 168 hours.   Recent Results (from the past 240 hour(s))  MRSA PCR Screening     Status: None   Collection Time: 07/18/20 12:38 AM   Specimen: Nasal Mucosa; Nasopharyngeal  Result Value Ref Range Status   MRSA by PCR NEGATIVE NEGATIVE Final    Comment:        The GeneXpert MRSA Assay (FDA approved for NASAL specimens only), is one component of a comprehensive MRSA colonization surveillance program. It is not intended to diagnose MRSA infection nor to guide or monitor treatment for MRSA infections. Performed at [facility], [address] [place], [place]   CULTURE, BLOOD (ROUTINE X 2) w Reflex to ID Panel     Status: None   Collection Time: 07/18/20  3:13 PM   Specimen: BLOOD  Result Value Ref Range Status   Specimen Description BLOOD BLOOD RIGHT FOREARM  Final   Special Requests   Final    BOTTLES DRAWN AEROBIC AND ANAEROBIC Blood Culture adequate volume   Culture   Final    NO GROWTH 5 DAYS Performed at [facility], [address] [place], [place]    Report Status 07/23/2020 FINAL  Final  CULTURE, BLOOD (ROUTINE X 2) w Reflex to ID Panel     Status: None   Collection Time: 07/18/20  3:17 PM  Specimen: BLOOD  Result Value Ref Range Status    Specimen Description BLOOD BLOOD RIGHT HAND  Final   Special Requests   Final    BOTTLES DRAWN AEROBIC AND ANAEROBIC Blood Culture adequate volume   Culture   Final    NO GROWTH 5 DAYS Performed at [facility], [address] [place], [place] [address]    Report Status 07/23/2020 FINAL  Final     Radiology Studies: No results found.  Scheduled Meds:   stroke: mapping our early stages of recovery book   Does not apply Once   aspirin EC  81 mg Oral Daily   budesonide (PULMICORT) nebulizer solution  0.5 mg Nebulization BID   Chlorhexidine Gluconate Cloth  6 each Topical Q0600   feeding supplement  237 mL Oral BID BM   folic acid  1 mg Oral Daily   furosemide  40 mg Oral Daily   ipratropium-albuterol  3 mL Nebulization BID   mouth rinse  15 mL Mouth Rinse BID   nicotine  21 mg Transdermal Daily   polyethylene glycol  17 g Oral Daily   predniSONE  20 mg Oral Q breakfast   senna-docusate  1 tablet Oral BID   sodium chloride flush  3 mL Intravenous Q12H   Continuous Infusions:     LOS: 10 days   Time spent: 35 minutes with >50% on coc  Lynn Ito, MD [facility]  If 7PM-7AM, please contact night-coverage [email or web address]  07/27/2020, 8:00 AM

## 2020-07-28 ENCOUNTER — Inpatient Hospital Stay (HOSPITAL_COMMUNITY)
Admit: 2020-07-28 | Discharge: 2020-07-28 | Disposition: A | Discharge status: Home / Self Care | Payer: Medicaid Other | Attending: Physician Assistant | Admitting: Physician Assistant

## 2020-07-28 ENCOUNTER — Other Ambulatory Visit: Payer: Self-pay | Admitting: Physician Assistant

## 2020-07-28 DIAGNOSIS — I639 Cerebral infarction, unspecified: Secondary | ICD-10-CM

## 2020-07-28 NOTE — Progress Notes (Signed)
Brandi Holland   DOB:February 23, 1965   QB#:[id number]    Subjective:  Patient just finished a session of physical therapy.  Husband at the bedside.  Objective:  Vitals:   07/28/20 0736 07/28/20 1155  BP: (!) 116/48 136/68  Pulse: 86 91  Resp: 18 18  Temp: 98.4 F (36.9 C) 98.4 F (36.9 C)  SpO2: 95% 97%     Intake/Output Summary (Last 24 hours) at 07/28/2020 1658 Last data filed at 07/28/2020 1410 Gross per 24 hour  Intake 120 ml  Output --  Net 120 ml     Physical Exam Constitutional:      Comments: Accompanied by family.  HENT:     Head: Normocephalic and atraumatic.     Mouth/Throat:     Pharynx: No oropharyngeal exudate.  Eyes:     Pupils: Pupils are equal, round, and reactive to light.  Cardiovascular:     Rate and Rhythm: Normal rate and regular rhythm.  Pulmonary:     Effort: No respiratory distress.     Breath sounds: No wheezing.     Comments: Decreased breath sounds bilaterally at bases.  No wheeze or crackles Abdominal:     General: Bowel sounds are normal. There is no distension.     Palpations: Abdomen is soft. There is no mass.     Tenderness: There is no abdominal tenderness. There is no guarding or rebound.  Musculoskeletal:        General: No tenderness. Normal range of motion.     Cervical back: Normal range of motion and neck supple.  Skin:    General: Skin is warm.  Neurological:     Mental Status: She is alert and oriented to person, place, and time.  Psychiatric:        Mood and Affect: Affect normal.     Labs:  Lab Results  Component Value Date   WBC 8.6 07/26/2020   HGB 9.8 (L) 07/26/2020   HCT 31.2 (L) 07/26/2020   MCV 101.0 (H) 07/26/2020   PLT 137 (L) 07/26/2020   NEUTROABS 4.8 07/26/2020    Lab Results  Component Value Date   NA 136 07/27/2020   K 3.9 07/27/2020   CL 102 07/27/2020   CO2 27 07/27/2020     Assessment and plan,  Thrombocytopenia, work-up indicated peripheral destruction. Improved after started on  prednisone.  Slow taper off outpatient Continue prednisone 20 mg for this week. Patient will need to follow-up with me outpatient in 1 to 2 weeks.  Anemia,  Hb stable. Monitor.   Folate deficiency, continue folate supplementation.   Stroke. Mental status has improved. TEE negative for vegetation. Cryptogenic source.   Discussed plan with husband at bedside.    Rickard Patience, MD 07/28/2020  4:58 PM

## 2020-07-28 NOTE — Progress Notes (Signed)
Occupational Therapy Treatment Patient Details Name: Brandi Holland MRN: [id number] DOB: 17-Sep-1965 Today's Date: 07/28/2020    History of present illness Pt is a 55 y/o F with PMH: unremarkable due to noncompliance with medical care. Pt presented to ED with 2-week history of feeling unwell/acting confused. Pt was seen at an OSH for concern for fluid overload with CHF as differentials. W/u at [facility] included: MRI showing multiple embolic looking infarctions in bilateral hemispheres concerning for a central embolic source. 2D Echo: unlikely HF, but does show left and right atrial enlargement. Blood cultures + for staph epi. Pt adm for encephalopathy with CVAs and ICU d/t respiratory failure.   OT comments  Upon entering the room, pt supine in bed and sleeping soundly. Pt is agreeable to OT intervention this session and on RA. Pt does not remember this therapist from previous session but is able to tell me her name and location. Pt comes into long sitting without assistance and fixes B socks as well as applying lotion to LEs . She squeezes lotion out with L hand and applies with R hand. She was able to problem solve task with increased time. Pt then comes to sit on EOB with supervision. She does endorse fear but holds therapist hands and requests to stand. Sit >stand from standard height bed with min A and use of RW. Pt ambulates 4 feet to sink and stands with wash hands at sink but fatigues quickly and requests to return to bed after task. Mod A for B LEs to return to supine. All needs within reach. Pt making progress towards occupational therapy goals this session.    Follow Up Recommendations  CIR    Equipment Recommendations  Other (comment) (defer to next venue of care)    Recommendations for Other Services      Precautions / Restrictions Precautions Precautions: Fall       Mobility Bed Mobility Overal bed mobility: Needs Assistance Bed Mobility: Supine to Sit;Sit to Supine Rolling:  Supervision   Supine to sit: Supervision Sit to supine: Mod assist   General bed mobility comments: Mod A to get LE's back onto bed    Transfers Overall transfer level: Needs assistance Equipment used: Rolling walker (2 wheeled) Transfers: Sit to/from Stand Sit to Stand: Min assist              Balance Overall balance assessment: Needs assistance Sitting-balance support: Bilateral upper extremity supported;Feet supported Sitting balance-Leahy Scale: Fair Sitting balance - Comments: Steady static sitting, reaching within BOS.   Standing balance support: Bilateral upper extremity supported Standing balance-Leahy Scale: Poor Standing balance comment: UE support for balance                           ADL either performed or assessed with clinical judgement   ADL Overall ADL's : Needs assistance/impaired     Grooming: Wash/dry hands;Standing;Min guard                                       Vision Patient Visual Report: No change from baseline            Cognition Arousal/Alertness: Awake/alert Behavior During Therapy: Flat affect Overall Cognitive Status: Impaired/Different from baseline Area of Impairment: Attention;Following commands;Safety/judgement;Awareness                 Orientation Level: Disoriented to;Time;Situation  Following Commands: Follows one step commands with increased time;Follows one step commands consistently Safety/Judgement: Decreased awareness of deficits;Decreased awareness of safety Awareness: Intellectual Problem Solving: Slow processing;Decreased initiation;Difficulty sequencing;Requires verbal cues;Requires tactile cues General Comments: has trouble hearing and affects following commands                   Pertinent Vitals/ Pain       Pain Assessment: No/denies pain  Home Living Family/patient expects to be discharged to:: Private residence Living Arrangements: Spouse/significant  other Available Help at Discharge: Family;Available PRN/intermittently Type of Home: House Home Access: Stairs to enter;Ramped entrance                                    Frequency  Min 3X/week        Progress Toward Goals  OT Goals(current goals can now be found in the care plan section)  Progress towards OT goals: Progressing toward goals  Acute Rehab OT Goals Patient Stated Goal: to get stronger OT Goal Formulation: With family Time For Goal Achievement: 08/01/20 Potential to Achieve Goals: Fair  Plan Discharge plan remains appropriate;Frequency remains appropriate       AM-PAC OT "6 Clicks" Daily Activity     Outcome Measure   Help from another person eating meals?: A Lot Help from another person taking care of personal grooming?: A Little Help from another person toileting, which includes using toliet, bedpan, or urinal?: A Lot Help from another person bathing (including washing, rinsing, drying)?: A Lot Help from another person to put on and taking off regular upper body clothing?: A Little Help from another person to put on and taking off regular lower body clothing?: A Lot 6 Click Score: 14    End of Session Equipment Utilized During Treatment: Rolling walker  OT Visit Diagnosis: Unsteadiness on feet (R26.81);Muscle weakness (generalized) (M62.81);Other symptoms and signs involving the nervous system (R29.898);Other symptoms and signs involving cognitive function   Activity Tolerance Patient tolerated treatment well   Patient Left in bed;with call bell/phone within reach;with bed alarm set;with family/visitor present   Nurse Communication Mobility status        Time: 9604-5409 OT Time Calculation (min): 27 min  Charges: OT General Charges $OT Visit: 1 Visit OT Treatments $Self Care/Home Management : 23-37 mins  Jackquline Denmark, MS, OTR/L , CBIS ascom [phone]  07/28/20, 4:12 PM

## 2020-07-28 NOTE — TOC Progression Note (Signed)
Reached out to [facility] inpatient rehab to inquire on the referral faxed to them on Friday, left a VM for a call back, sent bedsearch for STR SNF awaiting bed offers will review once obtained

## 2020-07-28 NOTE — TOC Progression Note (Signed)
Transition of Care [facility]) - Progression Note    Patient Details  Name: Brandi Holland MRN: [id number] Date of Birth: [birth year]  [facility]) CM/SW Contact  Barrie Dunker, RN Phone Number: 07/28/2020, 2:52 PM  Clinical Narrative:    Spoke with the patient and her husband and explained we have 1 bed offer with [facility] in [place], Assisted them in pulling up the facility on the web to view it, they accepted the bed offer, I accepted in the Hub and called She will need to get the heart monitor placed prior to DC,cardiology will bring it back tomorrow.         Expected Discharge Plan and Services                                                 Social Determinants of Health (SDOH) Interventions    Readmission Risk Interventions No flowsheet data found.

## 2020-07-28 NOTE — Progress Notes (Signed)
Physical Therapy Treatment Patient Details Name: Brandi Holland MRN: [id number] DOB: 02/04/1966 Today's Date: 07/28/2020    History of Present Illness Pt is a 55 y/o F with PMH: unremarkable due to noncompliance with medical care. Pt presented to ED with 2-week history of feeling unwell/acting confused. Pt was seen at an OSH for concern for fluid overload with CHF as differentials. W/u at [facility] included: MRI showing multiple embolic looking infarctions in bilateral hemispheres concerning for a central embolic source. 2D Echo: unlikely HF, but does show left and right atrial enlargement. Blood cultures + for staph epi. Pt adm for encephalopathy with CVAs and ICU d/t respiratory failure.    PT Comments    Pt is making good progress towards goals. Still demonstrates poor safety awareness and needs assist at all times for mobility. Able to ambulate with HHA this date, however needs heavy cues for safety. Elevated HR with exertion. Limited recall of previous sessions but thought PT had already worked with her today. Still remains with cognitive deficits, although able to follow commands. Good participation with therapy. Will continue to progress.   Follow Up Recommendations  CIR     Equipment Recommendations  Rolling walker with 5" wheels    Recommendations for Other Services       Precautions / Restrictions Precautions Precautions: Fall Restrictions Weight Bearing Restrictions: No    Mobility  Bed Mobility Overal bed mobility: Needs Assistance Bed Mobility: Supine to Sit;Sit to Supine Rolling: Supervision   Supine to sit: Supervision Sit to supine: Min assist   General bed mobility comments: Needs cues and gestures to begin movement initiation. Once seated at EOB, upright posture noted    Transfers Overall transfer level: Needs assistance Equipment used: Rolling walker (2 wheeled) Transfers: Sit to/from Stand Sit to Stand: Min guard         General transfer comment: needs  cues for sequencing. Once standing, upright posture noted  Ambulation/Gait Ambulation/Gait assistance: Min assist Gait Distance (Feet): 100 Feet Assistive device: Rolling walker (2 wheeled) Gait Pattern/deviations: Step-through pattern     General Gait Details: ambulated 2 laps in room with seated break in between. Pt with poor safety awareness, stays too close to front of RW and runs into obstacles. Needs min assist for steering RW. 2nd bout of ambulation performed with HHA. Still runs into obstacles on R side. No LOB noted, however fatigues quickly. HR increases to 120bpm and 130bpm respectively.   Stairs             Wheelchair Mobility    Modified Rankin (Stroke Patients Only)       Balance Overall balance assessment: Needs assistance Sitting-balance support: Bilateral upper extremity supported;Feet supported Sitting balance-Leahy Scale: Fair Sitting balance - Comments: Steady static sitting, reaching within BOS.   Standing balance support: Single extremity supported Standing balance-Leahy Scale: Fair Standing balance comment: UE support for balance                            Cognition Arousal/Alertness: Awake/alert Behavior During Therapy: Flat affect Overall Cognitive Status: Impaired/Different from baseline Area of Impairment: Attention;Following commands;Safety/judgement;Awareness                 Orientation Level: Disoriented to;Time;Situation     Following Commands: Follows one step commands with increased time;Follows one step commands consistently Safety/Judgement: Decreased awareness of deficits;Decreased awareness of safety Awareness: Intellectual Problem Solving: Slow processing;Decreased initiation;Difficulty sequencing;Requires verbal cues;Requires tactile cues General Comments: follows  commands well. Needs simple commands      Exercises      General Comments        Pertinent Vitals/Pain Pain Assessment: No/denies pain     Home Living Family/patient expects to be discharged to:: Private residence Living Arrangements: Spouse/significant other Available Help at Discharge: Family;Available PRN/intermittently Type of Home: House Home Access: Stairs to enter;Ramped entrance            Prior Function            PT Goals (current goals can now be found in the care plan section) Acute Rehab PT Goals Patient Stated Goal: to get stronger PT Goal Formulation: With patient Time For Goal Achievement: 08/02/20 Potential to Achieve Goals: Fair Progress towards PT goals: Progressing toward goals    Frequency    7X/week      PT Plan Current plan remains appropriate    Co-evaluation              AM-PAC PT "6 Clicks" Mobility   Outcome Measure  Help needed turning from your back to your side while in a flat bed without using bedrails?: A Little Help needed moving from lying on your back to sitting on the side of a flat bed without using bedrails?: A Little Help needed moving to and from a bed to a chair (including a wheelchair)?: A Little Help needed standing up from a chair using your arms (e.g., wheelchair or bedside chair)?: A Little Help needed to walk in hospital room?: A Little Help needed climbing 3-5 steps with a railing? : A Lot 6 Click Score: 17    End of Session Equipment Utilized During Treatment: Gait belt Activity Tolerance: Patient tolerated treatment well Patient left: in bed;with bed alarm set;with family/visitor present (MD entered room) Nurse Communication: Mobility status PT Visit Diagnosis: Muscle weakness (generalized) (M62.81);Difficulty in walking, not elsewhere classified (R26.2);Apraxia (R48.2)     Time: 4944-9675 PT Time Calculation (min) (ACUTE ONLY): 17 min  Charges:  $Gait Training: 8-22 mins                     Elizabeth Palau, , DPT [phone]    Brandi Holland 07/28/2020, 5:12 PM

## 2020-07-28 NOTE — Plan of Care (Signed)

## 2020-07-28 NOTE — Progress Notes (Signed)
Progress Note  Patient Name: Brandi Holland Date of Encounter: 07/28/2020  Primary Cardiologist: Kirke Corin  Subjective   Sitting up in bed. Husband at bedside, who feels like the patient is improving. Patient denies and chest pain, dyspnea, palpitations, dizziness, presyncope, or syncope.   Inpatient Medications    Scheduled Meds:   stroke: mapping our early stages of recovery book   Does not apply Once   aspirin EC  81 mg Oral Daily   budesonide (PULMICORT) nebulizer solution  0.5 mg Nebulization BID   Chlorhexidine Gluconate Cloth  6 each Topical Q0600   feeding supplement  237 mL Oral BID BM   folic acid  1 mg Oral Daily   furosemide  40 mg Oral Daily   ipratropium-albuterol  3 mL Nebulization BID   mouth rinse  15 mL Mouth Rinse BID   nicotine  21 mg Transdermal Daily   polyethylene glycol  17 g Oral Daily   predniSONE  20 mg Oral Q breakfast   senna-docusate  1 tablet Oral BID   sodium chloride flush  3 mL Intravenous Q12H   Continuous Infusions:  PRN Meds: acetaminophen **OR** acetaminophen, bisacodyl, ipratropium-albuterol, LORazepam   Vital Signs    Vitals:   07/28/20 0500 07/28/20 0504 07/28/20 0736 07/28/20 1155  BP:  (!) 109/53 (!) 116/48 136/68  Pulse:  80 86 91  Resp:  20 18 18   Temp:  98.5 F (36.9 C) 98.4 F (36.9 C) 98.4 F (36.9 C)  TempSrc:  Oral Oral   SpO2:  95% 95% 97%  Weight: 90 kg     Height:        Intake/Output Summary (Last 24 hours) at 07/28/2020 1449 Last data filed at 07/28/2020 1410 Gross per 24 hour  Intake 120 ml  Output 400 ml  Net -280 ml   Filed Weights   07/26/20 0456 07/27/20 0500 07/28/20 0500  Weight: 89.9 kg 89 kg 90 kg    Telemetry    SR - Personally Reviewed  ECG    No new tracings - Personally Reviewed  Physical Exam   GEN: No acute distress.   Neck: No JVD. Cardiac: RRR, II/VI diastolic murmur RUSB, no rubs, or gallops.  Respiratory: Diminished breath sounds along the bilateral bases.  GI: Soft,  nontender, non-distended.   MS: No edema; No deformity. Neuro:  Alert and oriented x 3; Nonfocal.  Psych: Normal affect.  Labs    Chemistry Recent Labs  Lab 07/23/20 0549 07/24/20 0500 07/27/20 0816  NA 138 136 136  K 4.0 3.7 3.9  CL 101 104 102  CO2 27 27 27   GLUCOSE 123* 92 91  BUN 41* 42* 34*  CREATININE 0.84 0.92 0.79  CALCIUM 9.5 9.3 9.1  GFRNONAA >60 >60 >60  ANIONGAP 10 5 7      Hematology Recent Labs  Lab 07/24/20 0500 07/25/20 0435 07/26/20 0509  WBC 8.4 8.9 8.6  RBC 2.85* 2.92* 3.09*  HGB 9.0* 9.2* 9.8*  HCT 29.1* 29.4* 31.2*  MCV 102.1* 100.7* 101.0*  MCH 31.6 31.5 31.7  MCHC 30.9 31.3 31.4  RDW 22.4* 21.2* 20.9*  PLT 113* 131* 137*    Cardiac EnzymesNo results for input(s): TROPONINI in the last 168 hours. No results for input(s): TROPIPOC in the last 168 hours.   BNP Recent Labs  Lab 07/26/20 0509  BNP 294.1*     DDimer No results for input(s): DDIMER in the last 168 hours.   Radiology    No results  found.  Cardiac Studies   TEE 07/23/2020: 1. Left ventricular ejection fraction, by estimation, is 55 to 60%. The  left ventricle has normal function.   2. Right ventricular systolic function is normal. The right ventricular  size is normal.   3. No left atrial/left atrial appendage thrombus was detected.   4. The mitral valve is normal in structure. Mild mitral valve  regurgitation.   5. 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 The aortic valve is  tricuspid. Aortic valve regurgitation is severe.   6. Agitated saline contrast bubble study was negative, with no evidence  of any interatrial shunt.  __________  2D echo 07/17/2020:  1. Left ventricular ejection fraction, by estimation, is 50 to 55%. The  left ventricle has low normal function. The left ventricle has no regional  wall motion abnormalities. Left ventricular diastolic parameters are   indeterminate.   2. Right ventricular systolic function is normal. The right ventricular  size is normal. Tricuspid regurgitation signal is inadequate for assessing  PA pressure.   3. Left atrial size was mildly dilated.   4. Right atrial size was mildly dilated.   5. The mitral valve is normal in structure. No evidence of mitral valve  regurgitation. No evidence of mitral stenosis.   6. The aortic valve is normal in structure. Aortic valve regurgitation is  mild. Mild aortic valve stenosis.   7. The inferior vena cava is dilated in size with <50% respiratory  variability, suggesting right atrial pressure of 15 mmHg.   Patient Profile     55 y.o. female with history of COPD, asthma, and tobacco use who was admitted with acute hypoxic respiratory failure, acute frontal lobe CVA, severe aortic insufficiency, anemia requiring 3 units of pRBC, thrombocytopenia, and elevated HS-Tn.   Assessment & Plan    1. Acute frontal lobe CVA: -No evidence of Afib on telemetry to date -Will need Zio patch on day of discharge, order has been placed  2. Severe aortic insufficiency: -Aorta normal in size and structure on TEE -Outpatient CT surgery follow up  3. Elevated high sensitivity troponin: -Mildly elevated -Likely supply demand ischemia in the setting of transfusion dependent anemia with AKI and respiratory distress vs in the setting of acute CVA -Echo with low normal LVSF this admission -Outpatient follow up -LDL 46, A1c 5.0 this admission  4. Anemia/thrombocytopenia: -Status post 3 units pRBC this admission -HGB low though stable on last check   For questions or updates, please contact [facility] Please consult [email or web address] for contact info under Cardiology/STEMI.    Signed, Eula Listen, PA-C [facility] Pager: [phone] 07/28/2020, 2:49 PM

## 2020-07-28 NOTE — Progress Notes (Signed)
PROGRESS NOTE    Brandi Holland  YWV:[id number] DOB: 12/16/1965 DOA: 07/16/2020 PCP: Pcp, No   Brief Narrative: Taken from H&P.  Brandi Holland is a 55 y.o. female with medical history significant of asthma, OSA, fibromyalgia who presents via EMS for shortness of breath.  She was hypoxic in 60s requiring 5 to 6 L of oxygen with intermittent nonrebreather. Initially admitting provider unable to obtained any history as there was no family member at bedside, no one could be reached on phone and patient was very altered.  Able to get hold of husband after multiple attempts at bedside, according to him patient was not feeling well for the past few weeks, she was admitted at [facility] on 06/18/2020 with lower extremity edema and was diuresed and discharged on 06/20/2020.  No echocardiogram done, CTA was negative for PE at that time, no significant abnormality on labs except sodium of 122 which improved to 130 and mild AKI, unknown baseline. Patient continued to feel bad and she was staying in bed for the past more than 2 weeks, unable to get out of bed, very poor p.o. intake.  Per husband she did had diarrhea with black color stool and he has seen some blood in stool last week, diarrhea lasted for 2 days and then resolved.  He did not noticed any hematemesis.  Patient does not drink alcohol but is a heavy smoker, smokes at least 2 packs/day.  Husband was able to brought her to ED 2 weeks ago and then she left after waiting couple of hours without being seen.  Per husband she was refusing to go see a doctor, when she continued to deteriorate he asked her son who lives in [place] who drove from there and talked with her and called the EMS. Husband denies any recent upper respiratory illnesses, fever or chills.  He did noticed worsening shortness of breath.  Patient was mostly bedbound for the past 2 weeks, he was using diapers to help her as she was unable to go to the bathroom by herself due to profound  weakness.  Patient remained very altered with intermittent desaturation, ABG with some respiratory alkalosis, no hypoxia.1/2 blood culture bottles with MSSE, MRI brain with multiple bilateral infarcts, worsening renal function so unable to obtain CTA, VQ scan ordered but got canceled due to recurrent desaturation.  D-dimer at 2.6, hemoglobin of 5.7, platelet counts of 97 this morning and there were 255 during recent hospitalization at [facility], BUN of 73 and creatinine of 2.99, sodium of 130, chloride of 96 and bicarb of 21.  Elevated T bili at 2.5 and INR of 1.5, lactic acidosis 3.2>>2.5>>1.9, elevated troponin 708>>904, BNP at 1893, CT chest and abdomen with bilateral groundglass opacities which can be due to pulmonary edema versus atypical pneumonia and no significant abnormality on CT abdomen.  Echocardiogram with EF of 50 to 55%, which is within lower normal limit, no wall motion abnormalities and indeterminate diastolic parameters, mildly biatrial dilatation and dilated inferior vena cava with less than 50% variability. Received total of 3 units PRBC 6/3:Had swallow evaluation today and they are recommending dysphagia 2 diet. 6/4: Cardiology started her on diuresis with IV Lasix 40 mg daily, some improvement in breathing status. 6/5: Significant improvement in breathing status, worsening thrombocytopenia, started on prednisone 6/6: No new complaint, platelets started improving. 6/7: TEE got canceled again as patient was difficult to arouse this morning, apparently was very agitated overnight and received a dose of Ativan, we will attempt again tomorrow.  6/8-Plan for TEE today. Per husband her MS is improving. Pt hard of hearing. 6/9-TEE no thrombus or pfo. Pt has no complaints .no BM for couple of days 6/10-MS continues to improvement. Husband at bedside. No complaints of chest pain, shortness of breath or nausea 6/11- no overnight issues.  6/12 off 02  at rest, getting PT today 6/13-no overnight  issues. Ate her breakfast.   Subjective: Patient reports "I guess I feel okay they tell me I am okay ".  Denies shortness of breath, abdominal pain, or chest pain  Assessment & Plan:   Principal Problem:   Acute encephalopathy Active Problems:   Stroke (cerebrum) (HCC)   Asthma   Acute respiratory failure with hypoxia (HCC)   AKI (acute kidney injury) (HCC)   Hyponatremia   OSA (obstructive sleep apnea)   AMS (altered mental status)   Thrombocytopenia (HCC)   Acute on chronic diastolic heart failure (HCC)   Folate deficiency   Hyperbilirubinemia   SOB (shortness of breath)   Aortic valve regurgitation   Acute ITP (HCC)  Severe acute encephalopathy.  Resolved  Unknown etiology and can be multifactorial with bilateral strokes. No obvious source of infection except 1 out of 2 blood culture with MSSE which is most likely a contaminant, procalcitonin at 0.35>>0.27, concern of cardiac embolic source which can be due to endocarditis but less likely with negative blood cultures . 6/13 MS continues to improve much better today than previous days Neurology has signed off    Acute hypoxic respiratory failure.  Improving, currently on 2 L of oxygen  Patient has an history of heavy smoking but no underlying diagnosis of COPD, not on home oxygen.  Initially required nonrebreather and then later switched to heated high flow. Might be due to encephalopathy versus pulmonary edema.  Elevated D-dimer but unable to obtain CTA due to AKI, VQ scan got canceled due to recurrent desaturation. Received iv lasix, now on po BNP lower than it was  6/13 improved.  On room air.   Continue p.o. 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  Hemoglobin and platelet started improving, she was started on prednisone with improving of platelets slowly. 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  Might  have GI bleed as husband was mentioning about dark color stools while she was having diarrhea for 2 days last week.  No recurrence at this time. Anemia panel with iron and folate deficiency.  Patient received 1 unit while in the ED, and 2 L next day. Received IV iron X 2 doses.  Hematology is involved-appreciate their help.  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  Rest of the labs pending. -Continue prednisone 60 mg daily-hematology to determine the duration.  CT abdomen with no concern of cirrhosis. 6/9 hemoglobin stable.  Platelets improving with prednisone.   Hematology following input was appreciated.  Since platelets improving recommend tapering 40 mg once a day starting today, taper further  on outpatient basis based on platelets count.  L DRV VT-negative for lupus anticoagulation.  Do not suspect any inhibitors related factors.  Recommended antiphospholipid antibody panel.   No evidence of hemolysis/schistocytes.  Iron studies inconclusive of iron deficiency.   6/12hematology following Antiphospholipid panel negative Prednisone 60 mg was started on 6/5 with tapering On prednisone 20 mg daily taper as outpatient basis Follow-up with Dr. Donneta Romberg as outpt    Severe AI- found on TEE. Need CTS follow  up at outpatient Cardiology following- will need f/u as outpt Continue lasix 40mg  qd     HFpEF/Elevated troponin.  No prior history of heart failure. EF 50-55% on echo with diastolic dysfunction  6/13-continue lasix Cardiology following. I/o  Bilateral multifocal acute cerebral infarct.  Most likely secondary to cardiac emboli, TTE without any obvious source. MRA without any significant stenosis Extremity Doppler scans were negative for any DVT, 1 small acute superficial thrombophlebitis in upper extremity. Neurology following No anticoagulation at this time due to severe anemia and thrombocytopenia. 6/9 TEE was done on 6/8. No thrombus or PFO. Will need  cardiac monitor upon discharge.      Constipation- Continue bowel regimen    Hypophosphatemia. Replaced and resolved   Hypoalbuminemia.  Causing some third spacing. Received albumin for hypoalbuminemia and to improve diuresis and third spacing. Albumin was d/c'd.     1/2 blood cultures with MSSE.  Most likely a contaminant.  Patient is afebrile with no leukocytosis, borderline procalcitonin.  Patient received 1 dose of cefepime and 2 doses of vancomycin earlier during admission.  And received 5 days of cefazolin.  No need for more antibiotics at this time. -Repeat blood culture -negative. -TTE without any significant abnormality D felt the staph epidermidis and staph capitis in 1 set of bcx are contaminents and to dc abx. ID signed off  Hyponatremia.  Resolved Sodium was 122 during recent admission at [facility] earlier in May  Most likely secondary to poor p.o. intake and volume overload.    AKI.  Improved  Unknown baseline, continue to improve, at 1.04 today.  It was 1.2 during her recent admission at [facility].  Renal ultrasound without any significant abnormality.  Foley was placed for strict intake and output. -Monitor renal function -Avoid nephrotoxins  Stage III obesity. Estimated body mass index is 42.94 kg/m as calculated from the following:   Height as of this encounter: 4\' 9"  (1.448 m).   Weight as of this encounter: 90 kg.  This will complicate overall prognosis.  Objective: Vitals:   07/28/20 0500 07/28/20 0504 07/28/20 0736 07/28/20 1155  BP:  (!) 109/53 (!) 116/48 136/68  Pulse:  80 86 91  Resp:  20 18 18   Temp:  98.5 F (36.9 C) 98.4 F (36.9 C) 98.4 F (36.9 C)  TempSrc:  Oral Oral   SpO2:  95% 95% 97%  Weight: 90 kg     Height:        Intake/Output Summary (Last 24 hours) at 07/28/2020 1242 Last data filed at 07/28/2020 1017 Gross per 24 hour  Intake 360 ml  Output 400 ml  Net -40 ml    Filed Weights   07/26/20 0456 07/27/20 0500 07/28/20 0500   Weight: 89.9 kg 89 kg 90 kg    Examination: Nad, calm, eating breakfast Cta no wr/r Regular s1/s2 no gallop Soft benign +bs No edema Awake , alert, grossly intact Mood and affect appropriate in current setting  DVT prophylaxis: SCDs, patient has severe anemia and thrombocytopenia. Code Status: Full Family Communication: None at bedside  Status is: Inpatient  Remains inpatient appropriate because:unsafe discharge  Dispo: The patient is from: Home              Anticipated d/c is to: CIR-insurance authorization/bed pending              Patient currently pt is medically stable for discharge   Difficult to place patient No  Level of care: Med-Surg  Disposition: CIR bed pending, case mx working on it if you have if you discharge  Consultants:  Neurology Cardiology ID Hematology  Procedures:  Antimicrobials:   Data Reviewed: I have personally reviewed following labs and imaging studies  CBC: Recent Labs  Lab 07/22/20 0411 07/23/20 0549 07/24/20 0500 07/25/20 0435 07/26/20 0509  WBC 6.9 7.9 8.4 8.9 8.6  NEUTROABS 3.4 6.0 4.9 6.1 4.8  HGB 8.7* 8.8* 9.0* 9.2* 9.8*  HCT 28.5* 28.2* 29.1* 29.4* 31.2*  MCV 101.4* 100.4* 102.1* 100.7* 101.0*  PLT 78* 97* 113* 131* 137*   Basic Metabolic Panel: Recent Labs  Lab 07/22/20 0411 07/23/20 0549 07/24/20 0500 07/27/20 0816  NA 134* 138 136 136  K 3.7 4.0 3.7 3.9  CL 101 101 104 102  CO2 26 27 27 27   GLUCOSE 105* 123* 92 91  BUN 39* 41* 42* 34*  CREATININE 1.04* 0.84 0.92 0.79  CALCIUM 9.1 9.5 9.3 9.1  PHOS 3.2  --   --   --    GFR: Estimated Creatinine Clearance: 73.4 mL/min (by C-G formula based on SCr of 0.79 mg/dL). Liver Function Tests: No results for input(s): AST, ALT, ALKPHOS, BILITOT, PROT, ALBUMIN in the last 168 hours.  No results for input(s): LIPASE, AMYLASE in the last 168 hours.  No results for input(s): AMMONIA in the last 168 hours.  Coagulation Profile: No results for  input(s): INR, PROTIME in the last 168 hours.  Cardiac Enzymes: No results for input(s): CKTOTAL, CKMB, CKMBINDEX, TROPONINI in the last 168 hours.  BNP (last 3 results) No results for input(s): PROBNP in the last 8760 hours. HbA1C: No results for input(s): HGBA1C in the last 72 hours. CBG: No results for input(s): GLUCAP in the last 168 hours.  Lipid Profile: No results for input(s): CHOL, HDL, LDLCALC, TRIG, CHOLHDL, LDLDIRECT in the last 72 hours. Thyroid Function Tests: No results for input(s): TSH, T4TOTAL, FREET4, T3FREE, THYROIDAB in the last 72 hours. Anemia Panel: No results for input(s): VITAMINB12, FOLATE, FERRITIN, TIBC, IRON, RETICCTPCT in the last 72 hours. Sepsis Labs: No results for input(s): PROCALCITON, LATICACIDVEN in the last 168 hours.   Recent Results (from the past 240 hour(s))  CULTURE, BLOOD (ROUTINE X 2) w Reflex to ID Panel     Status: None   Collection Time: 07/18/20  3:13 PM   Specimen: BLOOD  Result Value Ref Range Status   Specimen Description BLOOD BLOOD RIGHT FOREARM  Final   Special Requests   Final    BOTTLES DRAWN AEROBIC AND ANAEROBIC Blood Culture adequate volume   Culture   Final    NO GROWTH 5 DAYS Performed at [facility], [address] [place], [place] [address]    Report Status 07/23/2020 FINAL  Final  CULTURE, BLOOD (ROUTINE X 2) w Reflex to ID Panel     Status: None   Collection Time: 07/18/20  3:17 PM   Specimen: BLOOD  Result Value Ref Range Status   Specimen Description BLOOD BLOOD RIGHT HAND  Final   Special Requests   Final    BOTTLES DRAWN AEROBIC AND ANAEROBIC Blood Culture adequate volume   Culture   Final    NO GROWTH 5 DAYS Performed at [facility], [address] [place], [place] [address]    Report Status 07/23/2020 FINAL  Final     Radiology Studies: No results found.  Scheduled Meds:   stroke: mapping our early stages of recovery book   Does  not apply Once   aspirin EC  81 mg Oral  Daily   budesonide (PULMICORT) nebulizer solution  0.5 mg Nebulization BID   Chlorhexidine Gluconate Cloth  6 each Topical Q0600   feeding supplement  237 mL Oral BID BM   folic acid  1 mg Oral Daily   furosemide  40 mg Oral Daily   ipratropium-albuterol  3 mL Nebulization BID   mouth rinse  15 mL Mouth Rinse BID   nicotine  21 mg Transdermal Daily   polyethylene glycol  17 g Oral Daily   predniSONE  20 mg Oral Q breakfast   senna-docusate  1 tablet Oral BID   sodium chloride flush  3 mL Intravenous Q12H   Continuous Infusions:     LOS: 11 days   Time spent: 35 minutes with >50% on coc  Lynn Ito, MD [facility]  If 7PM-7AM, please contact night-coverage [email or web address]  07/28/2020, 12:42 PM

## 2020-07-28 NOTE — Progress Notes (Signed)
[employer] order placed ?

## 2020-07-28 NOTE — [place] FL2 (Signed)
[place] MEDICAID FL2 LEVEL OF CARE SCREENING TOOL     IDENTIFICATION  Patient Name: Brandi Holland Birthdate: 1965/03/02 Sex: female Admission Date (Current Location): 07/16/2020  [facility] and [place] Number:  Chiropodist and Address:  [facility], [address], [place], [place] [address]      Provider Number: [id number]  Attending Physician Name and Address:  Lynn Ito, MD  Relative Name and Phone Number:  Dimas Aguas [phone]    Current Level of Care: Hospital Recommended Level of Care: Skilled Nursing Facility Prior Approval Number:    Date Approved/Denied:   PASRR Number: [id number]  Discharge Plan: SNF    Current Diagnoses: Patient Active Problem List   Diagnosis Date Noted   Acute ITP [facility])    Aortic valve regurgitation    SOB (shortness of breath)    Acute on chronic diastolic heart failure (HCC)    Folate deficiency    Hyperbilirubinemia    Acute encephalopathy 07/17/2020   Stroke (cerebrum) (HCC) 07/17/2020   Asthma 07/17/2020   Acute respiratory failure with hypoxia (HCC) 07/17/2020   AKI (acute kidney injury) (HCC) 07/17/2020   Hyponatremia 07/17/2020   OSA (obstructive sleep apnea) 07/17/2020   AMS (altered mental status) 07/17/2020   Sepsis (HCC)    Anemia    Thrombocytopenia (HCC)     Orientation RESPIRATION BLADDER Height & Weight     Self, Time, Situation, Place  Normal External catheter Weight: 90 kg Height:  4\' 9"  (144.8 cm)  BEHAVIORAL SYMPTOMS/MOOD NEUROLOGICAL BOWEL NUTRITION STATUS      Continent Diet (dysphasgia 2)  AMBULATORY STATUS COMMUNICATION OF NEEDS Skin   Extensive Assist Verbally Surgical wounds                       Personal Care Assistance Level of Assistance  Bathing, Dressing Bathing Assistance: Limited assistance   Dressing Assistance: Limited assistance     Functional Limitations Info             SPECIAL CARE FACTORS FREQUENCY  PT (By licensed PT)     PT  Frequency: 5 times per week              Contractures Contractures Info: Not present    Additional Factors Info  Code Status, Allergies Code Status Info: full code Allergies Info: Clarithromycin, Sulfa Antibiotics           Current Medications (07/28/2020):  This is the current hospital active medication list Current Facility-Administered Medications  Medication Dose Route Frequency Provider Last Rate Last Admin    stroke: mapping our early stages of recovery book   Does not apply Once 07/30/2020, MD       acetaminophen (TYLENOL) tablet 650 mg  650 mg Oral Q6H PRN Synetta Fail, MD       Or   acetaminophen (TYLENOL) suppository 650 mg  650 mg Rectal Q6H PRN Synetta Fail, MD       aspirin EC tablet 81 mg  81 mg Oral Daily Synetta Fail, MD   81 mg at 07/28/20 0859   bisacodyl (DULCOLAX) suppository 10 mg  10 mg Rectal Daily PRN 07/30/20, MD       budesonide (PULMICORT) nebulizer solution 0.5 mg  0.5 mg Nebulization BID Synetta Fail D, NP   0.5 mg at 07/28/20 0739   Chlorhexidine Gluconate Cloth 2 % PADS 6 each  6 each Topical Q0600 07/30/20, NP  6 each at 07/28/20 0536   feeding supplement (ENSURE ENLIVE / ENSURE PLUS) liquid 237 mL  237 mL Oral BID BM Arnetha Courser, MD   237 mL at 07/26/20 1400   folic acid (FOLVITE) tablet 1 mg  1 mg Oral Daily Rickard Patience, MD   1 mg at 07/28/20 0859   furosemide (LASIX) tablet 40 mg  40 mg Oral Daily Lorine Bears A, MD   40 mg at 07/28/20 0859   ipratropium-albuterol (DUONEB) 0.5-2.5 (3) MG/3ML nebulizer solution 3 mL  3 mL Nebulization Q2H PRN Synetta Fail, MD   3 mL at 07/24/20 1321   ipratropium-albuterol (DUONEB) 0.5-2.5 (3) MG/3ML nebulizer solution 3 mL  3 mL Nebulization BID Lynn Ito, MD   3 mL at 07/28/20 0739   LORazepam (ATIVAN) injection 0.5 mg  0.5 mg Intravenous Q6H PRN Arnetha Courser, MD   0.5 mg at 07/22/20 0212   MEDLINE mouth rinse  15 mL Mouth Rinse BID Arnetha Courser, MD   15 mL at 07/26/20 1017   nicotine (NICODERM CQ - dosed in mg/24 hours) patch 21 mg  21 mg Transdermal Daily Erin Fulling, MD   21 mg at 07/28/20 0859   polyethylene glycol (MIRALAX / GLYCOLAX) packet 17 g  17 g Oral Daily Lynn Ito, MD   17 g at 07/28/20 0859   predniSONE (DELTASONE) tablet 20 mg  20 mg Oral Q breakfast Lynn Ito, MD   20 mg at 07/28/20 0859   senna-docusate (Senokot-S) tablet 1 tablet  1 tablet Oral BID Lynn Ito, MD   1 tablet at 07/28/20 0859   sodium chloride flush (NS) 0.9 % injection 3 mL  3 mL Intravenous Q12H Synetta Fail, MD   3 mL at 07/27/20 2125     Discharge Medications: Please see discharge summary for a list of discharge medications.  Relevant Imaging Results:  Relevant Lab Results:   Additional Information SS# [id number]  Barrie Dunker, RN

## 2020-07-29 NOTE — Progress Notes (Signed)
PROGRESS NOTE    Brandi Holland  ZOX:[id number] DOB: 1965/10/10 DOA: 07/16/2020 PCP: Pcp, No   Brief Narrative: Taken from H&P.  Brandi Holland is a 55 y.o. female with medical history significant of asthma, OSA, fibromyalgia who presents via EMS for shortness of breath.  She was hypoxic in 60s requiring 5 to 6 L of oxygen with intermittent nonrebreather. Initially admitting provider unable to obtained any history as there was no family member at bedside, no one could be reached on phone and patient was very altered.  Able to get hold of husband after multiple attempts at bedside, according to him patient was not feeling well for the past few weeks, she was admitted at [facility] on 06/18/2020 with lower extremity edema and was diuresed and discharged on 06/20/2020.  No echocardiogram done, CTA was negative for PE at that time, no significant abnormality on labs except sodium of 122 which improved to 130 and mild AKI, unknown baseline. Patient continued to feel bad and she was staying in bed for the past more than 2 weeks, unable to get out of bed, very poor p.o. intake.  Per husband she did had diarrhea with black color stool and he has seen some blood in stool last week, diarrhea lasted for 2 days and then resolved.  He did not noticed any hematemesis.  Patient does not drink alcohol but is a heavy smoker, smokes at least 2 packs/day.  Husband was able to brought her to ED 2 weeks ago and then she left after waiting couple of hours without being seen.  Per husband she was refusing to go see a doctor, when she continued to deteriorate he asked her son who lives in [place] who drove from there and talked with her and called the EMS. Husband denies any recent upper respiratory illnesses, fever or chills.  He did noticed worsening shortness of breath.  Patient was mostly bedbound for the past 2 weeks, he was using diapers to help her as she was unable to go to the bathroom by herself due to profound  weakness.  Patient remained very altered with intermittent desaturation, ABG with some respiratory alkalosis, no hypoxia.1/2 blood culture bottles with MSSE, MRI brain with multiple bilateral infarcts, worsening renal function so unable to obtain CTA, VQ scan ordered but got canceled due to recurrent desaturation.  D-dimer at 2.6, hemoglobin of 5.7, platelet counts of 97 this morning and there were 255 during recent hospitalization at [facility], BUN of 73 and creatinine of 2.99, sodium of 130, chloride of 96 and bicarb of 21.  Elevated T bili at 2.5 and INR of 1.5, lactic acidosis 3.2>>2.5>>1.9, elevated troponin 708>>904, BNP at 1893, CT chest and abdomen with bilateral groundglass opacities which can be due to pulmonary edema versus atypical pneumonia and no significant abnormality on CT abdomen.  Echocardiogram with EF of 50 to 55%, which is within lower normal limit, no wall motion abnormalities and indeterminate diastolic parameters, mildly biatrial dilatation and dilated inferior vena cava with less than 50% variability. Received total of 3 units PRBC 6/3:Had swallow evaluation today and they are recommending dysphagia 2 diet. 6/4: Cardiology started her on diuresis with IV Lasix 40 mg daily, some improvement in breathing status. 6/5: Significant improvement in breathing status, worsening thrombocytopenia, started on prednisone 6/6: No new complaint, platelets started improving. 6/7: TEE got canceled again as patient was difficult to arouse this morning, apparently was very agitated overnight and received a dose of Ativan, we will attempt again tomorrow.  Dr. Evalyn Casco week. 6/8-6/14 Patient had her TEE done and there was no thrombus or PFO.  Her mental status has improved. She was weaned off of oxygen.  She is basically been waiting for SNF bed which case management is working on it. Cardiology will be placing Zio monitor at the time of discharge , so please contact cardiology when she is ready to go to  SNF.    Subjective: Patient denies sob, cp, dizziness   Assessment & Plan:   Principal Problem:   Acute encephalopathy Active Problems:   Stroke (cerebrum) (HCC)   Asthma   Acute respiratory failure with hypoxia (HCC)   AKI (acute kidney injury) (HCC)   Hyponatremia   OSA (obstructive sleep apnea)   AMS (altered mental status)   Thrombocytopenia (HCC)   Acute on chronic diastolic heart failure (HCC)   Folate deficiency   Hyperbilirubinemia   SOB (shortness of breath)   Aortic valve regurgitation   Acute ITP (HCC)  Severe acute encephalopathy.  Resolved  Unknown etiology and can be multifactorial with bilateral strokes. No obvious source of infection except 1 out of 2 blood culture with MSSE which is most likely a contaminant, procalcitonin at 0.35>>0.27, concern of cardiac embolic source which can be due to endocarditis but less likely with negative blood cultures . 6/14- TEE negative for PFO and thrombus.  MS improved Neurology signed off     Acute hypoxic respiratory failure.  Improving, currently on 2 L of oxygen  Patient has an history of heavy smoking but no underlying diagnosis of COPD, not on home oxygen.  Initially required nonrebreather and then later switched to heated high flow. Might be due to encephalopathy versus pulmonary edema.  Elevated D-dimer but unable to obtain CTA due to AKI, VQ scan got canceled due to recurrent desaturation. 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  Hemoglobin and platelet started improving, she was started on prednisone with improving of platelets slowly. 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  Might have GI bleed as husband was mentioning about dark color stools while she was having diarrhea for 2 days last week.  No recurrence at this time. Anemia  panel with iron and folate deficiency.  Patient received 1 unit while in the ED, and 2 L next day. Received IV iron X 2 doses.  Hematology is involved-appreciate their help.  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  Rest of the labs pending. -Continue prednisone 60 mg daily-hematology to determine the duration.  CT abdomen with no concern of cirrhosis. -Started on prednisone for thrombocytopenia.  Since platelets improving recommend tapering steroids. L DRV VT-negative for lupus anticoagulation.  Do not suspect any inhibitors related factors.  Recommended antiphospholipid antibody panel.   No evidence of hemolysis/schistocytes.  Iron studies inconclusive of iron deficiency.   6/14-antiphospholipid panel negative Prednisone 60 mg was started on 6/5 with tapering, now on 20 mg to continue for this week. we will need to follow-up with Dr. Cathie Hoops as outpatient in 1-2 weeks    Severe AI- found on TEE. Continue Lasix 40 mg daily  Will need to follow-up with cardiology as outpatient for close monitoring  Plan now is after aggressive diuresis, consider repeat TTE for reassessment  Will need to follow-up with CTS as outpatient, they are aware      HFpEF/Elevated troponin.  No prior history of  heart failure. EF 50-55% on echo with diastolic dysfunction 6/14 continue Lasix.  Appears more euvolemic. Cardiology following   Bilateral multifocal acute cerebral infarct.  Most likely secondary to cardiac emboli, TTE without any obvious source. MRA without any significant stenosis Extremity Doppler scans were negative for any DVT, 1 small acute superficial thrombophlebitis in upper extremity. 6/14-neurology following Will need neurology follow-up as outpatient No anticoagulation at this time due to severe anemia and thrombocytopenia 6/8 TEE completed, no thrombus or PFO Cardiology will need to place ZIO monitor at the time of discharge-zio order has been placed by cards...>will  f/u     Constipation- Continue bowel regimen    Hypophosphatemia. Replaced and resolved   Hypoalbuminemia.  Causing some third spacing. Received albumin for hypoalbuminemia and to improve diuresis and third spacing. Albumin was d/c'd.     1/2 blood cultures with MSSE.  Most likely a contaminant.  Patient is afebrile with no leukocytosis, borderline procalcitonin.  Patient received 1 dose of cefepime and 2 doses of vancomycin earlier during admission.  And received 5 days of cefazolin.  No need for more antibiotics at this time. -Repeat blood culture -negative. -TTE without any significant abnormality D felt the staph epidermidis and staph capitis in 1 set of bcx are contaminents and to dc abx. ID signed off  Hyponatremia.  Resolved Sodium was 122 during recent admission at [facility] earlier in May  Most likely secondary to poor p.o. intake and volume overload.    AKI.  Improved  Unknown baseline, continue to improve, at 1.04 today.  It was 1.2 during her recent admission at [facility].  Renal ultrasound without any significant abnormality.   Foley was placed for strict intake and output. -Monitor renal function -Avoid nephrotoxins  Stage III obesity. Estimated body mass index is 42.94 kg/m as calculated from the following:   Height as of this encounter: 4\' 9"  (1.448 m).   Weight as of this encounter: 90 kg.  This will complicate overall prognosis.  Objective: Vitals:   07/28/20 2129 07/29/20 0533 07/29/20 0750 07/29/20 1134  BP: (!) 105/44 109/61 (!) 114/53 (!) 127/57  Pulse: 92 86 87 96  Resp: 19 18 17 16   Temp: 98.6 F (37 C) 97.8 F (36.6 C) 98.4 F (36.9 C) 99 F (37.2 C)  TempSrc:  Oral Oral   SpO2: 96% 98% 100% 92%  Weight:      Height:        Intake/Output Summary (Last 24 hours) at 07/29/2020 1336 Last data filed at 07/28/2020 1848 Gross per 24 hour  Intake 120 ml  Output --  Net 120 ml    Filed Weights   07/26/20 0456 07/27/20 0500 07/28/20 0500   Weight: 89.9 kg 89 kg 90 kg    Examination: Nad, calm Cta no wheeze rales rhonchi's  Regular S1-S2 no gallops Soft benign positive bowel sounds No edema Awake and alert x4 Mood and affect appropriate in current setting  DVT prophylaxis: SCDs, patient has severe anemia and thrombocytopenia. Code Status: Full Family Communication: None at bedside  Status is: Inpatient  Remains inpatient appropriate because:unsafe discharge  Dispo: The patient is from: Home              Anticipated d/c is to: CIR-insurance authorization/bed pending              Patient currently pt is medically stable for discharge   Difficult to place patient No  Level of care: Med-Surg  Disposition: CIR bed pending, case mx working on it if you have if you discharge  Consultants:  Neurology Cardiology ID Hematology  Procedures:  Antimicrobials:   Data Reviewed: I have personally reviewed following labs and imaging studies  CBC: Recent Labs  Lab 07/23/20 0549 07/24/20 0500 07/25/20 0435 07/26/20 0509  WBC 7.9 8.4 8.9 8.6  NEUTROABS 6.0 4.9 6.1 4.8  HGB 8.8* 9.0* 9.2* 9.8*  HCT 28.2* 29.1* 29.4* 31.2*  MCV 100.4* 102.1* 100.7* 101.0*  PLT 97* 113* 131* 137*   Basic Metabolic Panel: Recent Labs  Lab 07/23/20 0549 07/24/20 0500 07/27/20 0816  NA 138 136 136  K 4.0 3.7 3.9  CL 101 104 102  CO2 27 27 27   GLUCOSE 123* 92 91  BUN 41* 42* 34*  CREATININE 0.84 0.92 0.79  CALCIUM 9.5 9.3 9.1   GFR: Estimated Creatinine Clearance: 73.4 mL/min (by C-G formula based on SCr of 0.79 mg/dL). Liver Function Tests: No results for input(s): AST, ALT, ALKPHOS, BILITOT, PROT, ALBUMIN in the last 168 hours.  No results for input(s): LIPASE, AMYLASE in the last 168 hours.  No results for input(s): AMMONIA in the last 168 hours.  Coagulation Profile: No results for input(s): INR, PROTIME in the last 168 hours.  Cardiac Enzymes: No results for input(s): CKTOTAL, CKMB, CKMBINDEX,  TROPONINI in the last 168 hours.  BNP (last 3 results) No results for input(s): PROBNP in the last 8760 hours. HbA1C: No results for input(s): HGBA1C in the last 72 hours. CBG: No results for input(s): GLUCAP in the last 168 hours.  Lipid Profile: No results for input(s): CHOL, HDL, LDLCALC, TRIG, CHOLHDL, LDLDIRECT in the last 72 hours. Thyroid Function Tests: No results for input(s): TSH, T4TOTAL, FREET4, T3FREE, THYROIDAB in the last 72 hours. Anemia Panel: No results for input(s): VITAMINB12, FOLATE, FERRITIN, TIBC, IRON, RETICCTPCT in the last 72 hours. Sepsis Labs: No results for input(s): PROCALCITON, LATICACIDVEN in the last 168 hours.   No results found for this or any previous visit (from the past 240 hour(s)).    Radiology Studies: No results found.  Scheduled Meds:   stroke: mapping our early stages of recovery book   Does not apply Once   aspirin EC  81 mg Oral Daily   budesonide (PULMICORT) nebulizer solution  0.5 mg Nebulization BID   Chlorhexidine Gluconate Cloth  6 each Topical Q0600   feeding supplement  237 mL Oral BID BM   folic acid  1 mg Oral Daily   furosemide  40 mg Oral Daily   ipratropium-albuterol  3 mL Nebulization BID   mouth rinse  15 mL Mouth Rinse BID   nicotine  21 mg Transdermal Daily   polyethylene glycol  17 g Oral Daily   predniSONE  20 mg Oral Q breakfast   senna-docusate  1 tablet Oral BID   sodium chloride flush  3 mL Intravenous Q12H   Continuous Infusions:     LOS: 12 days   Time spent: 35 minutes with >50% on coc  , MD [facility]  If 7PM-7AM, please contact night-coverage [email or web address]  07/29/2020, 1:36 PM

## 2020-07-29 NOTE — Progress Notes (Signed)
Physical Therapy Treatment Patient Details Name: Brandi Holland MRN: [id number] DOB: June 07, 1965 Today's Date: 07/29/2020    History of Present Illness Pt is a 55 y/o F with PMH: unremarkable due to noncompliance with medical care. Pt presented to ED with 2-week history of feeling unwell/acting confused. Pt was seen at an OSH for concern for fluid overload with CHF as differentials. W/u at [facility] included: MRI showing multiple embolic looking infarctions in bilateral hemispheres concerning for a central embolic source. 2D Echo: unlikely HF, but does show left and right atrial enlargement. Blood cultures + for staph epi. Pt adm for encephalopathy with CVAs and ICU d/t respiratory failure.    PT Comments    Pt is making good progress towards goals with ability to ambulate in room distances. Does need assist for safety and spatial awareness. Still remains confused to situation and asks if she is progressing ok with therapy. Appears motivated and determined to work with therapy. Able to progress to standing there-ex this date with B UE support on window. 5 time sit<>stand in 24 seconds demonstrating decreased power and strength. Also needed B UE support for standing, unable to perform hands free. Will continue to progress as able, still recommending CIR.  Follow Up Recommendations  CIR     Equipment Recommendations  Rolling walker with 5" wheels    Recommendations for Other Services       Precautions / Restrictions Precautions Precautions: Fall Restrictions Weight Bearing Restrictions: No    Mobility  Bed Mobility Overal bed mobility: Needs Assistance Bed Mobility: Supine to Sit;Sit to Supine     Supine to sit: Supervision Sit to supine: Min guard   General bed mobility comments: Able to come to EOB with cues and gestures. Once seated, able to maintain static posture. Has trouble initiating mobility efforts, delayed resposne    Transfers Overall transfer level: Needs  assistance Equipment used: 1 person hand held assist Transfers: Sit to/from Stand Sit to Stand: Min guard         General transfer comment: follows commands well. Fatigues with exertion. Able to perform 5time sit<>Stand in 24 seconds with use of B UE  Ambulation/Gait Ambulation/Gait assistance: Min assist Gait Distance (Feet): 50 Feet Assistive device: 1 person hand held assist Gait Pattern/deviations: Step-through pattern     General Gait Details: Still has trouble with spatial awareness; runs into R side obstacles and generally unsafe, needs hands on assist. Short stride noted. Still has difficulty. HR increases to 115bpm with exertion.   Stairs             Wheelchair Mobility    Modified Rankin (Stroke Patients Only)       Balance Overall balance assessment: Needs assistance Sitting-balance support: Bilateral upper extremity supported;Feet supported Sitting balance-Leahy Scale: Fair     Standing balance support: Single extremity supported Standing balance-Leahy Scale: Fair                              Cognition Arousal/Alertness: Awake/alert Behavior During Therapy: WFL for tasks assessed/performed Overall Cognitive Status: Impaired/Different from baseline                                 General Comments: appears more oriented this date. Remembers therapist from previous session. Still confused to situation.      Exercises Other Exercises Other Exercises: Seated/standing ther-ex performed x 10 reps including  alt marching, LAQ, hip add squeezes, standing heel raises, marching, and mini squats. seated rest breaks taken as needed.    General Comments        Pertinent Vitals/Pain Pain Assessment: No/denies pain    Home Living                      Prior Function            PT Goals (current goals can now be found in the care plan section) Acute Rehab PT Goals Patient Stated Goal: to get stronger PT Goal  Formulation: With patient Time For Goal Achievement: 08/02/20 Potential to Achieve Goals: Fair Progress towards PT goals: Progressing toward goals    Frequency    7X/week      PT Plan Current plan remains appropriate    Co-evaluation              AM-PAC PT "6 Clicks" Mobility   Outcome Measure  Help needed turning from your back to your side while in a flat bed without using bedrails?: A Little Help needed moving from lying on your back to sitting on the side of a flat bed without using bedrails?: A Little Help needed moving to and from a bed to a chair (including a wheelchair)?: A Little Help needed standing up from a chair using your arms (e.g., wheelchair or bedside chair)?: A Little Help needed to walk in hospital room?: A Little Help needed climbing 3-5 steps with a railing? : A Lot 6 Click Score: 17    End of Session Equipment Utilized During Treatment: Gait belt Activity Tolerance: Patient tolerated treatment well Patient left: in bed;with bed alarm set Nurse Communication: Mobility status PT Visit Diagnosis: Muscle weakness (generalized) (M62.81);Difficulty in walking, not elsewhere classified (R26.2);Apraxia (R48.2)     Time: 0277-4128 PT Time Calculation (min) (ACUTE ONLY): 23 min  Charges:  $Gait Training: 8-22 mins $Therapeutic Exercise: 8-22 mins                     Elizabeth Palau, PT, DPT [phone]    Brandi Holland 07/29/2020, 4:12 PM

## 2020-07-29 NOTE — Progress Notes (Signed)
Progress Note  Patient Name: Brandi Holland Date of Encounter: 07/29/2020  Primary Cardiologist: Kirke Corin  Subjective   She denies chest pain and SOB. Reports she feels well. She has ambulated in the hallway with "weird" breathing. Net - 4.3 L for the admission.   Inpatient Medications    Scheduled Meds:   stroke: mapping our early stages of recovery book   Does not apply Once   aspirin EC  81 mg Oral Daily   budesonide (PULMICORT) nebulizer solution  0.5 mg Nebulization BID   Chlorhexidine Gluconate Cloth  6 each Topical Q0600   feeding supplement  237 mL Oral BID BM   folic acid  1 mg Oral Daily   furosemide  40 mg Oral Daily   ipratropium-albuterol  3 mL Nebulization BID   mouth rinse  15 mL Mouth Rinse BID   nicotine  21 mg Transdermal Daily   polyethylene glycol  17 g Oral Daily   predniSONE  20 mg Oral Q breakfast   senna-docusate  1 tablet Oral BID   sodium chloride flush  3 mL Intravenous Q12H   Continuous Infusions:  PRN Meds: acetaminophen **OR** acetaminophen, bisacodyl, ipratropium-albuterol, LORazepam   Vital Signs    Vitals:   07/28/20 2020 07/28/20 2129 07/29/20 0533 07/29/20 0750  BP:  (!) 105/44 109/61 (!) 114/53  Pulse:  92 86 87  Resp:  19 18 17   Temp:  98.6 F (37 C) 97.8 F (36.6 C) 98.4 F (36.9 C)  TempSrc:   Oral Oral  SpO2: 96% 96% 98% 100%  Weight:      Height:        Intake/Output Summary (Last 24 hours) at 07/29/2020 0848 Last data filed at 07/28/2020 1848 Gross per 24 hour  Intake 240 ml  Output --  Net 240 ml    Filed Weights   07/26/20 0456 07/27/20 0500 07/28/20 0500  Weight: 89.9 kg 89 kg 90 kg    Telemetry    SR - Personally Reviewed  ECG    No new tracings - Personally Reviewed  Physical Exam   GEN: No acute distress.   Neck: No JVD. Cardiac: RRR, II/VI diastolic murmur RUSB, no rubs, or gallops.  Respiratory: Diminished breath sounds along the bilateral bases.  GI: Soft, nontender, non-distended.   MS:  No edema; No deformity. Neuro:  Alert and oriented x 3; Nonfocal.  Psych: Normal affect.  Labs    Chemistry Recent Labs  Lab 07/23/20 0549 07/24/20 0500 07/27/20 0816  NA 138 136 136  K 4.0 3.7 3.9  CL 101 104 102  CO2 27 27 27   GLUCOSE 123* 92 91  BUN 41* 42* 34*  CREATININE 0.84 0.92 0.79  CALCIUM 9.5 9.3 9.1  GFRNONAA >60 >60 >60  ANIONGAP 10 5 7       Hematology Recent Labs  Lab 07/24/20 0500 07/25/20 0435 07/26/20 0509  WBC 8.4 8.9 8.6  RBC 2.85* 2.92* 3.09*  HGB 9.0* 9.2* 9.8*  HCT 29.1* 29.4* 31.2*  MCV 102.1* 100.7* 101.0*  MCH 31.6 31.5 31.7  MCHC 30.9 31.3 31.4  RDW 22.4* 21.2* 20.9*  PLT 113* 131* 137*     Cardiac EnzymesNo results for input(s): TROPONINI in the last 168 hours. No results for input(s): TROPIPOC in the last 168 hours.   BNP Recent Labs  Lab 07/26/20 0509  BNP 294.1*      DDimer No results for input(s): DDIMER in the last 168 hours.   Radiology  No results found.  Cardiac Studies   TEE 07/23/2020: 1. Left ventricular ejection fraction, by estimation, is 55 to 60%. The  left ventricle has normal function.   2. Right ventricular systolic function is normal. The right ventricular  size is normal.   3. No left atrial/left atrial appendage thrombus was detected.   4. The mitral valve is normal in structure. Mild mitral valve  regurgitation.   5. 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 The aortic valve is  tricuspid. Aortic valve regurgitation is severe.   6. Agitated saline contrast bubble study was negative, with no evidence  of any interatrial shunt.  __________  2D echo 07/17/2020:  1. Left ventricular ejection fraction, by estimation, is 50 to 55%. The  left ventricle has low normal function. The left ventricle has no regional  wall motion abnormalities. Left ventricular diastolic parameters are  indeterminate.   2. Right ventricular  systolic function is normal. The right ventricular  size is normal. Tricuspid regurgitation signal is inadequate for assessing  PA pressure.   3. Left atrial size was mildly dilated.   4. Right atrial size was mildly dilated.   5. The mitral valve is normal in structure. No evidence of mitral valve  regurgitation. No evidence of mitral stenosis.   6. The aortic valve is normal in structure. Aortic valve regurgitation is  mild. Mild aortic valve stenosis.   7. The inferior vena cava is dilated in size with <50% respiratory  variability, suggesting right atrial pressure of 15 mmHg.   Patient Profile     55 y.o. female with history of COPD, asthma, and tobacco use who was admitted with acute hypoxic respiratory failure, acute frontal lobe CVA, severe aortic insufficiency, anemia requiring 3 units of pRBC, thrombocytopenia, and elevated HS-Tn.   Assessment & Plan    1. Acute frontal lobe CVA: -No evidence of Afib on telemetry to date -Will need Zio patch on day of discharge, order has been placed; primary service, please contact cardiology on day of discharge so this can be placed as it cannot be placed sooner   2. Severe aortic insufficiency: -Aorta normal in size and structure on TEE -No evidence of clear vegetation  -Plan for outpatient TEE following diuresis, if severe AI persists, she will need CT surgery evaluation  -Lasix 40 mg daily (PTA she was on Lasix 20 mg daily)  -Ambulate   3. Elevated high sensitivity troponin: -Mildly elevated -Likely supply demand ischemia in the setting of transfusion dependent anemia with AKI and respiratory distress vs in the setting of acute CVA -Echo with low normal LVSF this admission -Outpatient follow up -LDL 46, A1c 5.0 this admission  4. Anemia/thrombocytopenia: -Status post 3 units pRBC this admission -HGB low though stable on last check   For questions or updates, please contact [facility] Please consult [email or web address] for contact  info under Cardiology/STEMI.    Signed, Eula Listen, PA-C [facility] Pager: [phone] 07/29/2020, 8:48 AM

## 2020-07-29 NOTE — Progress Notes (Signed)
Report given to [place], Charity fundraiser. Pt transferred to room 217. All belongings sent with pt.

## 2020-07-30 MED ORDER — ONDANSETRON 4 MG PO TBDP
4.0000 mg | ORAL_TABLET | Freq: Once | ORAL | Status: AC
  Administered 2020-07-30: 4 mg via ORAL
  Filled 2020-07-30: qty 1

## 2020-07-30 MED ORDER — COVID-19 MRNA VAC-TRIS(PFIZER) 30 MCG/0.3ML IM SUSP
0.3000 mL | Freq: Once | INTRAMUSCULAR | Status: AC
  Administered 2020-07-30: 0.3 mL via INTRAMUSCULAR
  Filled 2020-07-30: qty 0.3

## 2020-07-30 MED ORDER — ONDANSETRON HCL 4 MG/2ML IJ SOLN
4.0000 mg | Freq: Four times a day (QID) | INTRAMUSCULAR | Status: DC | PRN
  Filled 2020-07-30: qty 2

## 2020-07-30 NOTE — TOC Progression Note (Signed)
Transition of Care [facility]) - Progression Note    Patient Details  Name: Brandi Holland MRN: [id number] Date of Birth: 08/22/1965  Transition of Care [facility]) CM/SW Contact  Chapman Fitch, RN Phone Number: 07/30/2020, 9:33 AM  Clinical Narrative:    Sherron Monday with Delorise Shiner at [facility] and confirmed that insurance Berkley Harvey is pending Will need covid booster, and cardiac monitor placed prior to discharge.  MD updated         Expected Discharge Plan and Services                                                 Social Determinants of Health (SDOH) Interventions    Readmission Risk Interventions No flowsheet data found.

## 2020-07-30 NOTE — Progress Notes (Signed)
PROGRESS NOTE    [name]  ZOX:[id number] DOB: 1965/10/10 DOA: 07/16/2020 PCP: Pcp, No   Brief Narrative: Taken from H&P.  GIAVANNI ZEITLIN is a 55 y.o. female with medical history significant of asthma, OSA, fibromyalgia who presents via EMS for shortness of breath.  She was hypoxic in 60s requiring 5 to 6 L of oxygen with intermittent nonrebreather. Initially admitting provider unable to obtained any history as there was no family member at bedside, no one could be reached on phone and patient was very altered.  Able to get hold of husband after multiple attempts at bedside, according to him patient was not feeling well for the past few weeks, she was admitted at [facility] on 06/18/2020 with lower extremity edema and was diuresed and discharged on 06/20/2020.  No echocardiogram done, CTA was negative for PE at that time, no significant abnormality on labs except sodium of 122 which improved to 130 and mild AKI, unknown baseline. Patient continued to feel bad and she was staying in bed for the past more than 2 weeks, unable to get out of bed, very poor p.o. intake.  Per husband she did had diarrhea with black color stool and he has seen some blood in stool last week, diarrhea lasted for 2 days and then resolved.  He did not noticed any hematemesis.  Patient does not drink alcohol but is a heavy smoker, smokes at least 2 packs/day.  Husband was able to brought her to ED 2 weeks ago and then she left after waiting couple of hours without being seen.  Per husband she was refusing to go see a doctor, when she continued to deteriorate he asked her son who lives in [place] who drove from there and talked with her and called the EMS. Husband denies any recent upper respiratory illnesses, fever or chills.  He did noticed worsening shortness of breath.  Patient was mostly bedbound for the past 2 weeks, he was using diapers to help her as she was unable to go to the bathroom by herself due to profound  weakness.  Patient remained very altered with intermittent desaturation, ABG with some respiratory alkalosis, no hypoxia.1/2 blood culture bottles with MSSE, MRI brain with multiple bilateral infarcts, worsening renal function so unable to obtain CTA, VQ scan ordered but got canceled due to recurrent desaturation.  D-dimer at 2.6, hemoglobin of 5.7, platelet counts of 97 this morning and there were 255 during recent hospitalization at [facility], BUN of 73 and creatinine of 2.99, sodium of 130, chloride of 96 and bicarb of 21.  Elevated T bili at 2.5 and INR of 1.5, lactic acidosis 3.2>>2.5>>1.9, elevated troponin 708>>904, BNP at 1893, CT chest and abdomen with bilateral groundglass opacities which can be due to pulmonary edema versus atypical pneumonia and no significant abnormality on CT abdomen.  Echocardiogram with EF of 50 to 55%, which is within lower normal limit, no wall motion abnormalities and indeterminate diastolic parameters, mildly biatrial dilatation and dilated inferior vena cava with less than 50% variability. Received total of 3 units PRBC 6/3:Had swallow evaluation today and they are recommending dysphagia 2 diet. 6/4: Cardiology started her on diuresis with IV Lasix 40 mg daily, some improvement in breathing status. 6/5: Significant improvement in breathing status, worsening thrombocytopenia, started on prednisone 6/6: No new complaint, platelets started improving. 6/7: TEE got canceled again as patient was difficult to arouse this morning, apparently was very agitated overnight and received a dose of Ativan, we will attempt again tomorrow.  Dr. Marylu Lund week. 6/8-6/14 Patient had her TEE done and there was no thrombus or PFO.  Her mental status has improved. She was weaned off of oxygen.  She is basically been waiting for SNF bed which case management is working on it. Cardiology will be placing Zio monitor at the time of discharge , so please contact cardiology when she is ready to go to  SNF.   6/15. stable, waiting for insurance authorization.  Ordered COVID booster today.  Subjective: Patient was resting comfortably.  Denies any complaints.  Quite hard of hearing.  Assessment & Plan:   Principal Problem:   Acute encephalopathy Active Problems:   Stroke (cerebrum) (HCC)   Asthma   Acute respiratory failure with hypoxia (HCC)   AKI (acute kidney injury) (HCC)   Hyponatremia   OSA (obstructive sleep apnea)   AMS (altered mental status)   Thrombocytopenia (HCC)   Acute on chronic diastolic heart failure (HCC)   Folate deficiency   Hyperbilirubinemia   SOB (shortness of breath)   Aortic valve regurgitation   Acute ITP (HCC)  Severe acute encephalopathy.  Resolved  Unknown etiology and can be multifactorial with bilateral strokes. No obvious source of infection except 1 out of 2 blood culture with MSSE which is most likely a contaminant, procalcitonin at 0.35>>0.27, concern of cardiac embolic source which can be due to endocarditis but less likely with negative blood cultures . 6/14- TEE negative for PFO and thrombus.  MS improved Neurology signed off  Acute hypoxic respiratory failure.  Resolved.  On room air now.  Patient has an history of heavy smoking but no underlying diagnosis of COPD, not on home oxygen.  Initially required nonrebreather and then later switched to heated high flow. Might be due to encephalopathy versus pulmonary edema.  Elevated D-dimer but unable to obtain CTA due to AKI, VQ scan got canceled due to recurrent desaturation. 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  Hemoglobin and platelet started improving, she was started on prednisone with improving of platelets slowly. 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  Might have GI bleed as husband was mentioning about dark color stools while she was having diarrhea  for 2 days last week.  No recurrence at this time. Anemia panel with iron and folate deficiency.  Patient received 1 unit while in the ED, and 2 L next day. Received IV iron X 2 doses.  Hematology is involved-appreciate their help.  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  Rest of the labs pending. -Continue prednisone 60 mg daily-hematology to determine the duration.  CT abdomen with no concern of cirrhosis. -Started on prednisone for thrombocytopenia.  Since platelets improving recommend tapering steroids. L DRV VT-negative for lupus anticoagulation.  Do not suspect any inhibitors related factors.  Recommended antiphospholipid antibody panel.   No evidence of hemolysis/schistocytes.  Iron studies inconclusive of iron deficiency.   6/14-antiphospholipid panel negative Prednisone 60 mg was started on 6/5 with tapering, now on 20 mg to continue for this week. we will need to follow-up with Dr. Cathie Hoops as outpatient in 1-2 weeks   Severe AI- found on TEE. Continue Lasix 40 mg daily  Will need to follow-up with cardiology as outpatient for close monitoring  Plan now is after aggressive diuresis, consider repeat TTE for reassessment  Will need to follow-up with CTS as outpatient, they are aware    HFpEF/Elevated troponin.  No prior history  of heart failure. EF 50-55% on echo with diastolic dysfunction 6/14 continue Lasix.  Appears more euvolemic. Cardiology following   Bilateral multifocal acute cerebral infarct.  Most likely secondary to cardiac emboli, TTE without any obvious source. MRA without any significant stenosis Extremity Doppler scans were negative for any DVT, 1 small acute superficial thrombophlebitis in upper extremity. 6/14-neurology following Will need neurology follow-up as outpatient No anticoagulation at this time due to severe anemia and thrombocytopenia 6/8 TEE completed, no thrombus or PFO Cardiology will need to place ZIO monitor at the time of  discharge-zio order has been placed by cards...>will f/u  Constipation- Continue bowel regimen   Hypophosphatemia. Replaced and resolved  Hypoalbuminemia.  Causing some third spacing. Received albumin for hypoalbuminemia and to improve diuresis and third spacing. Albumin was d/c'd.  1/2 blood cultures with MSSE.  Most likely a contaminant.  Patient is afebrile with no leukocytosis, borderline procalcitonin.  Patient received 1 dose of cefepime and 2 doses of vancomycin earlier during admission.  And received 5 days of cefazolin.  No need for more antibiotics at this time. -Repeat blood culture -negative. -TTE without any significant abnormality D felt the staph epidermidis and staph capitis in 1 set of bcx are contaminents and to dc abx. ID signed off  Hyponatremia.  Resolved Sodium was 122 during recent admission at [facility] earlier in May  Most likely secondary to poor p.o. intake and volume overload.  AKI.  Improved  Unknown baseline, continue to improve, at 1.04 today.  It was 1.2 during her recent admission at [facility].  Renal ultrasound without any significant abnormality.   Foley was placed for strict intake and output. -Monitor renal function -Avoid nephrotoxins  Stage III obesity. Estimated body mass index is 42.94 kg/m as calculated from the following:   Height as of this encounter: 4\' 9"  (1.448 m).   Weight as of this encounter: 90 kg.  This will complicate overall prognosis.  Objective: Vitals:   07/30/20 0354 07/30/20 0830 07/30/20 1149 07/30/20 1540  BP: (!) 120/52 (!) 108/46 (!) 108/48 121/68  Pulse: 81 84 84 90  Resp: 16 18 18 18   Temp: 98 F (36.7 C) 98.2 F (36.8 C) 98.2 F (36.8 C) 98.7 F (37.1 C)  TempSrc: Oral Oral Oral Oral  SpO2: 99% 98% 98% 99%  Weight:      Height:        Intake/Output Summary (Last 24 hours) at 07/30/2020 1628 Last data filed at 07/30/2020 1416 Gross per 24 hour  Intake 240 ml  Output 250 ml  Net -10 ml     Filed Weights    07/26/20 0456 07/27/20 0500 07/28/20 0500  Weight: 89.9 kg 89 kg 90 kg    Examination: General.  Obese lady, in no acute distress. Pulmonary.  Lungs clear bilaterally, normal respiratory effort. CV.  Regular rate and rhythm, no JVD, rub or murmur. Abdomen.  Soft, nontender, nondistended, BS positive. CNS.  Alert and oriented .  No focal neurologic deficit. Extremities.  No edema, no cyanosis, pulses intact and symmetrical. Psychiatry.  Judgment and insight appears normal.   DVT prophylaxis: SCDs,  Code Status: Full Family Communication: None at bedside  Status is: Inpatient  Remains inpatient appropriate because:unsafe discharge  Dispo: The patient is from: Home              Anticipated d/c is to: SNF-had bed offer pending insurance authorization              Patient currently pt  is medically stable for discharge   Difficult to place patient No               Level of care: Med-Surg  Consultants:  Neurology Cardiology ID Hematology  Procedures:  Antimicrobials:   Data Reviewed: I have personally reviewed following labs and imaging studies  CBC: Recent Labs  Lab 07/24/20 0500 07/25/20 0435 07/26/20 0509  WBC 8.4 8.9 8.6  NEUTROABS 4.9 6.1 4.8  HGB 9.0* 9.2* 9.8*  HCT 29.1* 29.4* 31.2*  MCV 102.1* 100.7* 101.0*  PLT 113* 131* 137*    Basic Metabolic Panel: Recent Labs  Lab 07/24/20 0500 07/27/20 0816  NA 136 136  K 3.7 3.9  CL 104 102  CO2 27 27  GLUCOSE 92 91  BUN 42* 34*  CREATININE 0.92 0.79  CALCIUM 9.3 9.1    GFR: Estimated Creatinine Clearance: 73.4 mL/min (by C-G formula based on SCr of 0.79 mg/dL). Liver Function Tests: No results for input(s): AST, ALT, ALKPHOS, BILITOT, PROT, ALBUMIN in the last 168 hours.  No results for input(s): LIPASE, AMYLASE in the last 168 hours.  No results for input(s): AMMONIA in the last 168 hours.  Coagulation Profile: No results for input(s): INR, PROTIME in the last 168 hours.  Cardiac Enzymes: No  results for input(s): CKTOTAL, CKMB, CKMBINDEX, TROPONINI in the last 168 hours.  BNP (last 3 results) No results for input(s): PROBNP in the last 8760 hours. HbA1C: No results for input(s): HGBA1C in the last 72 hours. CBG: No results for input(s): GLUCAP in the last 168 hours.  Lipid Profile: No results for input(s): CHOL, HDL, LDLCALC, TRIG, CHOLHDL, LDLDIRECT in the last 72 hours. Thyroid Function Tests: No results for input(s): TSH, T4TOTAL, FREET4, T3FREE, THYROIDAB in the last 72 hours. Anemia Panel: No results for input(s): VITAMINB12, FOLATE, FERRITIN, TIBC, IRON, RETICCTPCT in the last 72 hours. Sepsis Labs: No results for input(s): PROCALCITON, LATICACIDVEN in the last 168 hours.   No results found for this or any previous visit (from the past 240 hour(s)).    Radiology Studies: No results found.  Scheduled Meds:   stroke: mapping our early stages of recovery book   Does not apply Once   aspirin EC  81 mg Oral Daily   feeding supplement  237 mL Oral BID BM   folic acid  1 mg Oral Daily   furosemide  40 mg Oral Daily   mouth rinse  15 mL Mouth Rinse BID   nicotine  21 mg Transdermal Daily   polyethylene glycol  17 g Oral Daily   predniSONE  20 mg Oral Q breakfast   senna-docusate  1 tablet Oral BID   sodium chloride flush  3 mL Intravenous Q12H   Continuous Infusions:     LOS: 13 days   Time spent: 30 minutes with >50% on coc  Arnetha Courser, MD [facility]  If 7PM-7AM, please contact night-coverage [email or web address]  07/30/2020, 4:28 PM

## 2020-07-30 NOTE — Progress Notes (Signed)
Progress Note  Patient Name: Brandi Holland Date of Encounter: 07/30/2020  [facility] Cardiologist: Dr. Kirke Corin  Subjective   Sleeping comfortably, no acute events overnight.  Inpatient Medications    Scheduled Meds:   stroke: mapping our early stages of recovery book   Does not apply Once   aspirin EC  81 mg Oral Daily   feeding supplement  237 mL Oral BID BM   folic acid  1 mg Oral Daily   furosemide  40 mg Oral Daily   mouth rinse  15 mL Mouth Rinse BID   nicotine  21 mg Transdermal Daily   polyethylene glycol  17 g Oral Daily   predniSONE  20 mg Oral Q breakfast   senna-docusate  1 tablet Oral BID   sodium chloride flush  3 mL Intravenous Q12H   Continuous Infusions:  PRN Meds: acetaminophen **OR** acetaminophen, bisacodyl, ipratropium-albuterol, LORazepam, ondansetron (ZOFRAN) IV   Vital Signs    Vitals:   07/29/20 2035 07/30/20 0000 07/30/20 0354 07/30/20 0830  BP: 133/67 (!) 118/56 (!) 120/52 (!) 108/46  Pulse: 91 90 81 84  Resp: 18 17 16 18   Temp: 98.2 F (36.8 C) 98.3 F (36.8 C) 98 F (36.7 C) 98.2 F (36.8 C)  TempSrc: Oral Oral Oral Oral  SpO2: 96% 99% 99% 98%  Weight:      Height:        Intake/Output Summary (Last 24 hours) at 07/30/2020 1117 Last data filed at 07/30/2020 1008 Gross per 24 hour  Intake 120 ml  Output 250 ml  Net -130 ml   Last 3 Weights 07/28/2020 07/27/2020 07/26/2020  Weight (lbs) 198 lb 6.6 oz 196 lb 3.4 oz 198 lb 4.8 oz  Weight (kg) 90 kg 89 kg 89.948 kg      Telemetry    Sinus rhythm- Personally Reviewed  ECG    No new tracing reviewed- Personally Reviewed  Physical Exam   GEN: No acute distress.   Neck: No JVD Cardiac: RRR, no murmurs, Respiratory: Diminished breath sounds at bases GI: Soft, nontender, non-distended  MS: Left arm and leg weakness Neuro:  Nonfocal  Psych: Normal affect   Labs    High Sensitivity Troponin:   Recent Labs  Lab 07/16/20 2016 07/17/20 0534 07/17/20 0901  07/17/20 1227  TROPONINIHS 708* 869* 846* 904*      Chemistry Recent Labs  Lab 07/24/20 0500 07/27/20 0816  NA 136 136  K 3.7 3.9  CL 104 102  CO2 27 27  GLUCOSE 92 91  BUN 42* 34*  CREATININE 0.92 0.79  CALCIUM 9.3 9.1  GFRNONAA >60 >60  ANIONGAP 5 7     Hematology Recent Labs  Lab 07/24/20 0500 07/25/20 0435 07/26/20 0509  WBC 8.4 8.9 8.6  RBC 2.85* 2.92* 3.09*  HGB 9.0* 9.2* 9.8*  HCT 29.1* 29.4* 31.2*  MCV 102.1* 100.7* 101.0*  MCH 31.6 31.5 31.7  MCHC 30.9 31.3 31.4  RDW 22.4* 21.2* 20.9*  PLT 113* 131* 137*    BNP Recent Labs  Lab 07/26/20 0509  BNP 294.1*     DDimer No results for input(s): DDIMER in the last 168 hours.   Radiology    No results found.  Cardiac Studies   Echo 07/17/2020: 1. Left ventricular ejection fraction, by estimation, is 50 to 55%. The  left ventricle has low normal function. The left ventricle has no regional  wall motion abnormalities. Left ventricular diastolic parameters are  indeterminate.   2. Right ventricular  systolic function is normal. The right ventricular  size is normal. Tricuspid regurgitation signal is inadequate for assessing  PA pressure.   3. Left atrial size was mildly dilated.   4. Right atrial size was mildly dilated.   5. The mitral valve is normal in structure. No evidence of mitral valve  regurgitation. No evidence of mitral stenosis.   6. The aortic valve is normal in structure. Aortic valve regurgitation is  mild. Mild aortic valve stenosis.   7. The inferior vena cava is dilated in size with <50% respiratory  variability, suggesting right atrial pressure of 15 mmHg.  Patient Profile     55 y.o. female with history of COPD, OSA, smoker, presenting with respiratory failure, diagnosed with frontal CVA and severe aortic regurgitation   Assessment & Plan    1.  Severe aortic regurgitation  -Follow-up with CT surgery as outpatient. -Continue Lasix 40 mg daily  -EF preserved     2. Acute  frontal CVA  -No evidence of A. fib so far  -cardiac monitor upon discharge.      3.  Respiratory failure, COPD  -Overall improved, currently on room air -Management as per primary team     Patient waiting for placement.  No additional cardiac testing or intervention planned this admission.  Continue medications as above.  Recommend close follow-up as outpatient.  Please let us know if additional cardiac input is needed.  Cardiology will sign off.  Greater than 50% was spent in counseling and coordination of care with patient  Total encounter time 35 minutes     Signed, Debbe Odea, MD  07/30/2020, 11:17 AM

## 2020-07-31 DIAGNOSIS — G9340 Encephalopathy, unspecified: Secondary | ICD-10-CM | POA: Diagnosis not present

## 2020-07-31 LAB — CBC
HCT: 32 % — ABNORMAL LOW (ref 36.0–46.0)
Hemoglobin: 10.2 g/dL — ABNORMAL LOW (ref 12.0–15.0)
MCH: 32.1 pg (ref 26.0–34.0)
MCHC: 31.9 g/dL (ref 30.0–36.0)
MCV: 100.6 fL — ABNORMAL HIGH (ref 80.0–100.0)
Platelets: 264 10*3/uL (ref 150–400)
RBC: 3.18 MIL/uL — ABNORMAL LOW (ref 3.87–5.11)
RDW: 19 % — ABNORMAL HIGH (ref 11.5–15.5)
WBC: 10.7 10*3/uL — ABNORMAL HIGH (ref 4.0–10.5)
nRBC: 0 % (ref 0.0–0.2)

## 2020-07-31 LAB — BASIC METABOLIC PANEL
Anion gap: 6 (ref 5–15)
BUN: 31 mg/dL — ABNORMAL HIGH (ref 6–20)
CO2: 28 mmol/L (ref 22–32)
Calcium: 9.2 mg/dL (ref 8.9–10.3)
Chloride: 102 mmol/L (ref 98–111)
Creatinine, Ser: 0.83 mg/dL (ref 0.44–1.00)
GFR, Estimated: >60 mL/min (ref >60)
Glucose, Bld: 84 mg/dL (ref 70–99)
Potassium: 3.5 mmol/L (ref 3.5–5.1)
Sodium: 136 mmol/L (ref 135–145)

## 2020-07-31 NOTE — Progress Notes (Addendum)
Nutrition Follow-up  DOCUMENTATION CODES:  Obesity unspecified  INTERVENTION:  Advance diet to dysphagia 3 to allow for greater meal choices Continue Ensure Enlive po, BID, each supplement provides 350 kcal and 20 grams of protein  NUTRITION DIAGNOSIS:  Inadequate oral intake related to lethargy/confusion as evidenced by per patient/family report.  GOAL:  Patient will meet greater than or equal to 90% of their needs  MONITOR:  PO intake, Supplement acceptance, Labs, Weight trends, I & O's  REASON FOR ASSESSMENT: Consult Assessment of nutrition requirement/status  ASSESSMENT:  55 year old female with a past medical history significant for asthma, OSA, and fibromyalgia who presented to [facility] ED on 07/16/2020 due to complaints of shortness of breath and altered mental status. Admitted for acute frontal lobe CVA and acute hypoxic respiratory failure.   Pt remains inpatient at this time, awaiting placement. Medically stable. Cardiology does not plan to do any further work-up or intervention at hospital. Reviewed intake since last assessment. Overall, intake declined. Pt remains on dys 2 diet, appears it was not reassessed by SLP. Messaged MD and discussed conversation with pt and husband about normal diet and eating pattern at home despite poor dentition. MD ok with advancing to dys 3 diet to allow for greater food options. Weight overall stable since last assessment. Will continue to monitor.   Also noted pt now on a bowel regimen with last recorded BM today.  Average Meal Intake: 6/1-6/9: 76% intake x 4 recorded meals 6/10-6/16: 51% intake x 12 recorded meals  Nutritionally Relevant Medications: Scheduled Meds:  feeding supplement  237 mL Oral BID BM   folic acid  1 mg Oral Daily   furosemide  40 mg Oral Daily   polyethylene glycol  17 g Oral Daily   predniSONE  20 mg Oral Q breakfast   senna-docusate  1 tablet Oral BID   PRN Meds: bisacodyl, ondansetron (ZOFRAN) IV  Labs  reviewed: BUN 31  NUTRITION - FOCUSED PHYSICAL EXAM: Flowsheet Row Most Recent Value  Orbital Region No depletion  Upper Arm Region No depletion  Thoracic and Lumbar Region No depletion  Buccal Region No depletion  Temple Region No depletion  Clavicle Bone Region No depletion  Clavicle and Acromion Bone Region No depletion  Scapular Bone Region No depletion  Dorsal Hand No depletion  Patellar Region No depletion  Anterior Thigh Region No depletion  Posterior Calf Region No depletion  Edema (RD Assessment) Mild  Hair Reviewed  Eyes Reviewed  Mouth Reviewed  Skin Reviewed  Nails Reviewed   Diet Order:   Diet Order             DIET DYS 3 Room service appropriate? Yes; Fluid consistency: Thin  Diet effective now                   EDUCATION NEEDS: No education needs have been identified at this time  Skin:  Skin Assessment: Reviewed RN Assessment  Last BM:  6/16 - type 5  Height:  Ht Readings from Last 1 Encounters:  07/16/20 4\' 9"  (1.448 m)    Weight:  Wt Readings from Last 1 Encounters:  07/31/20 88.6 kg   BMI:  Body mass index is 42.27 kg/m.  Estimated Nutritional Needs:  Kcal:  1450-1650 Protein:  55-70g Fluid:  1.6L/day  08/02/20, RD, LDN Clinical Dietitian Pager on Amion

## 2020-07-31 NOTE — TOC Progression Note (Signed)
Transition of Care [facility]) - Progression Note    Patient Details  Name: Brandi Holland MRN: [id number] Date of Birth: [birth year]  [facility] CM/SW Contact  Chapman Fitch, RN Phone Number: 07/31/2020, 9:08 AM  Clinical Narrative:    Sherron Monday with Delorise Shiner from [facility].  They are still working on [employer]                                                 Social Determinants of Health (SDOH) Interventions    Readmission Risk Interventions No flowsheet data found.

## 2020-07-31 NOTE — Progress Notes (Signed)
Physical Therapy Treatment Patient Details Name: Brandi Holland MRN: [id number] DOB: 09/15/1965 Today's Date: 07/31/2020    History of Present Illness Pt is a 55 y/o F with PMH: unremarkable due to noncompliance with medical care. Pt presented to ED with 2-week history of feeling unwell/acting confused. Pt was seen at an OSH for concern for fluid overload with CHF as differentials. W/u at [facility] included: MRI showing multiple embolic looking infarctions in bilateral hemispheres concerning for a central embolic source. 2D Echo: unlikely HF, but does show left and right atrial enlargement. Blood cultures + for staph epi. Pt adm for encephalopathy with CVAs and ICU d/t respiratory failure.    PT Comments    Pt asleep on 2 attempts this am.  Awake in PM and agrees to session despite fatigue.  Transitions to EOB with supervision.  Assist to don gowns front and back with min assist as she struggles to do so on her own.  Stands and opts to use RW today.  Does not use AD at baseline.  She needs min assist for gait mostly for cues and to get walker away from walls/objects.  Drifts to left when exiting room but is inconsistent and seems to drift to whatever object is closest to her line of site.  When left to choose her own path she walks straight into nursing station desk across from her room and makes no attempt to correct but just stands there with walker against desk requiring cues to re-direct.  During gait she does state she feels "funny."  When asked, she stated "I feel tired but I'm not."  HR does increase to 129 with gait today.  100'.  Assisted back to bed and positioned for comfort.  Pt continues to require significant cues for motor planning, safety and general awareness.  She struggles with simple tasks which is not her baseline.  If CIR is not available to her, SNF would be appropriate to continue ongoing therapy interventions.     Follow Up Recommendations  CIR     Equipment Recommendations   Rolling walker with 5" wheels    Recommendations for Other Services       Precautions / Restrictions Precautions Precautions: Fall Restrictions Weight Bearing Restrictions: No Other Position/Activity Restrictions: HHFNC    Mobility  Bed Mobility Overal bed mobility: Needs Assistance Bed Mobility: Supine to Sit;Sit to Supine Rolling: Supervision   Supine to sit: Supervision Sit to supine: Supervision        Transfers Overall transfer level: Needs assistance Equipment used: Rolling walker (2 wheeled) Transfers: Sit to/from Stand Sit to Stand: Min guard            Ambulation/Gait Ambulation/Gait assistance: Min assist Gait Distance (Feet): 100 Feet Assistive device: Rolling walker (2 wheeled) Gait Pattern/deviations: Step-to pattern;Drifts right/left Gait velocity: decreased   General Gait Details: running into objects left and right today.  Seems to drift to whatever is closest to her in regards to walls/equipement.  decreased step length and height and overall seems confused when given directions/cues.  HOH does play a role but does not explain all deficits.  HR up to 129 today with gait.   Stairs             Wheelchair Mobility    Modified Rankin (Stroke Patients Only)       Balance Overall balance assessment: Needs assistance Sitting-balance support: Bilateral upper extremity supported;Feet supported Sitting balance-Leahy Scale: Fair     Standing balance support: Single extremity supported Standing  balance-Leahy Scale: Fair                              Cognition Arousal/Alertness: Awake/alert Behavior During Therapy: WFL for tasks assessed/performed Overall Cognitive Status: Impaired/Different from baseline                                        Exercises      General Comments        Pertinent Vitals/Pain Pain Assessment: No/denies pain    Home Living                      Prior Function             PT Goals (current goals can now be found in the care plan section) Progress towards PT goals: Progressing toward goals    Frequency    7X/week      PT Plan Current plan remains appropriate    Co-evaluation              AM-PAC PT "6 Clicks" Mobility   Outcome Measure  Help needed turning from your back to your side while in a flat bed without using bedrails?: None Help needed moving from lying on your back to sitting on the side of a flat bed without using bedrails?: None Help needed moving to and from a bed to a chair (including a wheelchair)?: A Little Help needed standing up from a chair using your arms (e.g., wheelchair or bedside chair)?: A Little Help needed to walk in hospital room?: A Little Help needed climbing 3-5 steps with a railing? : A Lot 6 Click Score: 19    End of Session Equipment Utilized During Treatment: Gait belt Activity Tolerance: Patient tolerated treatment well Patient left: in bed;with call bell/phone within reach;with bed alarm set Nurse Communication: Mobility status PT Visit Diagnosis: Muscle weakness (generalized) (M62.81);Difficulty in walking, not elsewhere classified (R26.2);Apraxia (R48.2)     Time: 5462-7035 PT Time Calculation (min) (ACUTE ONLY): 16 min  Charges:  $Gait Training: 8-22 mins                    Danielle Dess, PTA 07/31/20, 2:41 PM , 2:36 PM

## 2020-07-31 NOTE — Progress Notes (Signed)
PROGRESS NOTE    Brandi Holland  ZOX:[id number] DOB: 1965/10/10 DOA: 07/16/2020 PCP: Pcp, No   Brief Narrative: Taken from H&P.  Brandi Holland is a 55 y.o. female with medical history significant of asthma, OSA, fibromyalgia who presents via EMS for shortness of breath.  She was hypoxic in 60s requiring 5 to 6 L of oxygen with intermittent nonrebreather. Initially admitting provider unable to obtained any history as there was no family member at bedside, no one could be reached on phone and patient was very altered.  Able to get hold of husband after multiple attempts at bedside, according to him patient was not feeling well for the past few weeks, she was admitted at [facility] on 06/18/2020 with lower extremity edema and was diuresed and discharged on 06/20/2020.  No echocardiogram done, CTA was negative for PE at that time, no significant abnormality on labs except sodium of 122 which improved to 130 and mild AKI, unknown baseline. Patient continued to feel bad and she was staying in bed for the past more than 2 weeks, unable to get out of bed, very poor p.o. intake.  Per husband she did had diarrhea with black color stool and he has seen some blood in stool last week, diarrhea lasted for 2 days and then resolved.  He did not noticed any hematemesis.  Patient does not drink alcohol but is a heavy smoker, smokes at least 2 packs/day.  Husband was able to brought her to ED 2 weeks ago and then she left after waiting couple of hours without being seen.  Per husband she was refusing to go see a doctor, when she continued to deteriorate he asked her son who lives in [place] who drove from there and talked with her and called the EMS. Husband denies any recent upper respiratory illnesses, fever or chills.  He did noticed worsening shortness of breath.  Patient was mostly bedbound for the past 2 weeks, he was using diapers to help her as she was unable to go to the bathroom by herself due to profound  weakness.  Patient remained very altered with intermittent desaturation, ABG with some respiratory alkalosis, no hypoxia.1/2 blood culture bottles with MSSE, MRI brain with multiple bilateral infarcts, worsening renal function so unable to obtain CTA, VQ scan ordered but got canceled due to recurrent desaturation.  D-dimer at 2.6, hemoglobin of 5.7, platelet counts of 97 this morning and there were 255 during recent hospitalization at [facility], BUN of 73 and creatinine of 2.99, sodium of 130, chloride of 96 and bicarb of 21.  Elevated T bili at 2.5 and INR of 1.5, lactic acidosis 3.2>>2.5>>1.9, elevated troponin 708>>904, BNP at 1893, CT chest and abdomen with bilateral groundglass opacities which can be due to pulmonary edema versus atypical pneumonia and no significant abnormality on CT abdomen.  Echocardiogram with EF of 50 to 55%, which is within lower normal limit, no wall motion abnormalities and indeterminate diastolic parameters, mildly biatrial dilatation and dilated inferior vena cava with less than 50% variability. Received total of 3 units PRBC 6/3:Had swallow evaluation today and they are recommending dysphagia 2 diet. 6/4: Cardiology started her on diuresis with IV Lasix 40 mg daily, some improvement in breathing status. 6/5: Significant improvement in breathing status, worsening thrombocytopenia, started on prednisone 6/6: No new complaint, platelets started improving. 6/7: TEE got canceled again as patient was difficult to arouse this morning, apparently was very agitated overnight and received a dose of Ativan, we will attempt again tomorrow.  Dr. Marylu LundAmery week. 6/8-6/14 Patient had her TEE done and there was no thrombus or PFO.  Her mental status has improved. She was weaned off of oxygen.  She is basically been waiting for SNF bed which case management is working on it. Cardiology will be placing Zio monitor at the time of discharge , so please contact cardiology when she is ready to go to  SNF.   6/15. stable, waiting for insurance authorization.  Ordered COVID booster today.  Subjective: Patient was resting comfortably, no new complaint.  Assessment & Plan:   Principal Problem:   Acute encephalopathy Active Problems:   Stroke (cerebrum) (HCC)   Asthma   Acute respiratory failure with hypoxia (HCC)   AKI (acute kidney injury) (HCC)   Hyponatremia   OSA (obstructive sleep apnea)   AMS (altered mental status)   Thrombocytopenia (HCC)   Acute on chronic diastolic heart failure (HCC)   Folate deficiency   Hyperbilirubinemia   SOB (shortness of breath)   Aortic valve regurgitation   Acute ITP (HCC)  Severe acute encephalopathy.  Resolved  Unknown etiology and can be multifactorial with bilateral strokes. No obvious source of infection except 1 out of 2 blood culture with MSSE which is most likely a contaminant, procalcitonin at 0.35>>0.27, concern of cardiac embolic source which can be due to endocarditis but less likely with negative blood cultures . 6/14- TEE negative for PFO and thrombus.  MS improved Neurology signed off  Acute hypoxic respiratory failure.  Resolved.  On room air now.  Patient has an history of heavy smoking but no underlying diagnosis of COPD, not on home oxygen.  Initially required nonrebreather and then later switched to heated high flow. Might be due to encephalopathy versus pulmonary edema.  Elevated D-dimer but unable to obtain CTA due to AKI, VQ scan got canceled due to recurrent desaturation. 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  Resolved. 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  Might have GI bleed as husband was mentioning about dark color stools while she was having diarrhea for 2 days last week.  No recurrence at this time. Anemia panel with iron and folate deficiency.  Patient received 1 unit while  in the ED, and 2 L next day. Received IV iron X 2 doses.  Hematology is involved-appreciate their help.  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  Rest of the labs pending.  CT abdomen with no concern of cirrhosis. -Started on prednisone for thrombocytopenia.  Since platelets improving recommend tapering steroids. L DRV VT-negative for lupus anticoagulation.  Do not suspect any inhibitors related factors.  Recommended antiphospholipid antibody panel.   No evidence of hemolysis/schistocytes.  Iron studies inconclusive of iron deficiency.   6/14-antiphospholipid panel negative Prednisone 60 mg was started on 6/5 with tapering, now on 20 mg to continue for this week. we will need to follow-up with Dr. Cathie HoopsYu as outpatient in 1-2 weeks   Severe AI- found on TEE. Continue Lasix 40 mg daily  Will need to follow-up with cardiology as outpatient for close monitoring  Plan now is after aggressive diuresis, consider repeat TTE for reassessment  Will need to follow-up with CTS as outpatient, they are aware    HFpEF/Elevated troponin.  No prior history of heart failure. EF 50-55% on echo with diastolic dysfunction 6/14 continue Lasix.  Appears more euvolemic. Cardiology following   Bilateral multifocal acute cerebral infarct.  Most likely  secondary to cardiac emboli, TTE without any obvious source. MRA without any significant stenosis Extremity Doppler scans were negative for any DVT, 1 small acute superficial thrombophlebitis in upper extremity. 6/14-neurology following Will need neurology follow-up as outpatient No anticoagulation at this time due to severe anemia and thrombocytopenia 6/8 TEE completed, no thrombus or PFO Cardiology will need to place ZIO monitor at the time of discharge-zio order has been placed by cards...>will f/u  Constipation- Continue bowel regimen   Hypophosphatemia. Replaced and resolved  Hypoalbuminemia.  Causing some third spacing. Received  albumin for hypoalbuminemia and to improve diuresis and third spacing. Albumin was d/c'd.  1/2 blood cultures with MSSE.  Most likely a contaminant.  Patient is afebrile with no leukocytosis, borderline procalcitonin.  Patient received 1 dose of cefepime and 2 doses of vancomycin earlier during admission.  And received 5 days of cefazolin.  No need for more antibiotics at this time. -Repeat blood culture -negative. -TTE without any significant abnormality D felt the staph epidermidis and staph capitis in 1 set of bcx are contaminents and to dc abx. ID signed off  Hyponatremia.  Resolved Sodium was 122 during recent admission at [facility] earlier in May  Most likely secondary to poor p.o. intake and volume overload.  AKI.  Improved  Unknown baseline, continue to improve, at 1.04 today.  It was 1.2 during her recent admission at [facility].  Renal ultrasound without any significant abnormality.   Foley was placed for strict intake and output. -Monitor renal function -Avoid nephrotoxins  Stage III obesity. Estimated body mass index is 42.27 kg/m as calculated from the following:   Height as of this encounter: 4\' 9"  (1.448 m).   Weight as of this encounter: 88.6 kg.  This will complicate overall prognosis.  Objective: Vitals:   07/31/20 0500 07/31/20 0753 07/31/20 1122 07/31/20 1619  BP:  (!) 111/44 113/62 127/64  Pulse:  83 89 (!) 106  Resp:  18 16 20   Temp:  (!) 97.5 F (36.4 C) 98.3 F (36.8 C) 97.6 F (36.4 C)  TempSrc:  Oral Oral Oral  SpO2:  95% 98% 96%  Weight: 88.6 kg     Height:        Intake/Output Summary (Last 24 hours) at 07/31/2020 1623 Last data filed at 07/31/2020 1100 Gross per 24 hour  Intake 240 ml  Output 300 ml  Net -60 ml     Filed Weights   07/27/20 0500 07/28/20 0500 07/31/20 0500  Weight: 89 kg 90 kg 88.6 kg    Examination: General. In no acute distress. Pulmonary.  Lungs clear bilaterally, normal respiratory effort. CV.  Regular rate and rhythm, no  JVD, rub or murmur. Abdomen.  Soft, nontender, nondistended, BS positive. CNS.  Alert and oriented .  No focal neurologic deficit. Extremities.  No edema, no cyanosis, pulses intact and symmetrical. Psychiatry.  Judgment and insight appears normal.   DVT prophylaxis: SCDs,  Code Status: Full Family Communication: None at bedside  Status is: Inpatient  Remains inpatient appropriate because:unsafe discharge  Dispo: The patient is from: Home              Anticipated d/c is to: SNF-had bed offer pending insurance authorization              Patient currently pt is medically stable for discharge   Difficult to place patient No               Level of care: Med-Surg  Consultants:  Neurology  Cardiology ID Hematology  Procedures:  Antimicrobials:   Data Reviewed: I have personally reviewed following labs and imaging studies  CBC: Recent Labs  Lab 07/25/20 0435 07/26/20 0509 07/31/20 0515  WBC 8.9 8.6 10.7*  NEUTROABS 6.1 4.8  --   HGB 9.2* 9.8* 10.2*  HCT 29.4* 31.2* 32.0*  MCV 100.7* 101.0* 100.6*  PLT 131* 137* 264    Basic Metabolic Panel: Recent Labs  Lab 07/27/20 0816 07/31/20 0515  NA 136 136  K 3.9 3.5  CL 102 102  CO2 27 28  GLUCOSE 91 84  BUN 34* 31*  CREATININE 0.79 0.83  CALCIUM 9.1 9.2    GFR: Estimated Creatinine Clearance: 70 mL/min (by C-G formula based on SCr of 0.83 mg/dL). Liver Function Tests: No results for input(s): AST, ALT, ALKPHOS, BILITOT, PROT, ALBUMIN in the last 168 hours.  No results for input(s): LIPASE, AMYLASE in the last 168 hours.  No results for input(s): AMMONIA in the last 168 hours.  Coagulation Profile: No results for input(s): INR, PROTIME in the last 168 hours.  Cardiac Enzymes: No results for input(s): CKTOTAL, CKMB, CKMBINDEX, TROPONINI in the last 168 hours.  BNP (last 3 results) No results for input(s): PROBNP in the last 8760 hours. HbA1C: No results for input(s): HGBA1C in the last 72 hours. CBG: No  results for input(s): GLUCAP in the last 168 hours.  Lipid Profile: No results for input(s): CHOL, HDL, LDLCALC, TRIG, CHOLHDL, LDLDIRECT in the last 72 hours. Thyroid Function Tests: No results for input(s): TSH, T4TOTAL, FREET4, T3FREE, THYROIDAB in the last 72 hours. Anemia Panel: No results for input(s): VITAMINB12, FOLATE, FERRITIN, TIBC, IRON, RETICCTPCT in the last 72 hours. Sepsis Labs: No results for input(s): PROCALCITON, LATICACIDVEN in the last 168 hours.   No results found for this or any previous visit (from the past 240 hour(s)).    Radiology Studies: No results found.  Scheduled Meds:   stroke: mapping our early stages of recovery book   Does not apply Once   aspirin EC  81 mg Oral Daily   feeding supplement  237 mL Oral BID BM   folic acid  1 mg Oral Daily   furosemide  40 mg Oral Daily   mouth rinse  15 mL Mouth Rinse BID   nicotine  21 mg Transdermal Daily   polyethylene glycol  17 g Oral Daily   predniSONE  20 mg Oral Q breakfast   senna-docusate  1 tablet Oral BID   sodium chloride flush  3 mL Intravenous Q12H   Continuous Infusions:     LOS: 14 days   Time spent: 30 minutes with >50% on coc  Arnetha Courser, MD [facility]  If 7PM-7AM, please contact night-coverage [email or web address]  07/31/2020, 4:23 PM

## 2020-07-31 NOTE — TOC Progression Note (Signed)
Transition of Care [facility]) - Progression Note    Patient Details  Name: Brandi Holland MRN: [id number] Date of Birth: May 17, 1965  [facility] CM/SW Contact  Chapman Fitch, RN Phone Number: 07/31/2020, 1:27 PM  Clinical Narrative:      Message sent to [facility] to determine if there is any update.  Resent out referral to facilities that have not responded in HUB  Voicemail left for husband to update       Expected Discharge Plan and Services                                                 Social Determinants of Health (SDOH) Interventions    Readmission Risk Interventions No flowsheet data found.

## 2020-07-31 NOTE — Evaluation (Signed)
Occupational Therapy Re-Evaluation Patient Details Name: Brandi Holland MRN: [id number] DOB: [birth year] Today's Date: 07/31/2020    History of Present Illness Pt is a 55 y/o F with PMH: unremarkable due to noncompliance with medical care. Pt presented to ED with 2-week history of feeling unwell/acting confused. Pt was seen at an OSH for concern for fluid overload with CHF as differentials. W/u at [facility] included: MRI showing multiple embolic looking infarctions in bilateral hemispheres concerning for a central embolic source. 2D Echo: unlikely HF, but does show left and right atrial enlargement. Blood cultures + for staph epi. Pt adm for encephalopathy with CVAs and ICU d/t respiratory failure.   Clinical Impression   Pt seen for OT re-evaluation. Pt lying on her side, spouse present, and reports feeling anxious but unable to verbalize why. Pt agreeable to washing up with therapy. Pt performed bed mobility with supervision. Able to sit EOB for ~51min with intermittent UE support on bed and intermittent foot support on floor. Pt required set up and PRN MIN-MOD A for LB dressing, bathing, and applying lotion. Pt required ~25-75% verbal cues and PRN tactile cues to initiate tasks and for sequencing. Pt remains weak but demonstrates improved strength and activity tolerance this date, progressing towards OT goals. Goals reviewed and updated based on pt's progress. Pt would benefit from continued skilled OT services to maximize return to PLOF and minimize caregiver burden or need for higher level of care long term. Continue to recommend high-intensity therapy to reach these goals.     Follow Up Recommendations  CIR    Equipment Recommendations  Other (comment) (defer to next venue)    Recommendations for Other Services       Precautions / Restrictions Precautions Precautions: Fall Restrictions Weight Bearing Restrictions: No Other Position/Activity Restrictions: HHFNC      Mobility Bed  Mobility Overal bed mobility: Needs Assistance Bed Mobility: Supine to Sit;Sit to Supine Rolling: Supervision   Supine to sit: Supervision Sit to supine: Supervision   General bed mobility comments: VC to initiate    Transfers Overall transfer level: Needs assistance Equipment used: None Transfers: Lateral/Scoot Transfers Sit to Stand: Min guard        Lateral/Scoot Transfers: Min assist General transfer comment: VC to initiate/sequence    Balance Overall balance assessment: Needs assistance Sitting-balance support: Single extremity supported;No upper extremity supported;Feet supported;Feet unsupported Sitting balance-Leahy Scale: Fair Sitting balance - Comments: Able to bring LE up on bed to access foot for bathing/dressing, intermittent UE support on bed   Standing balance support: Single extremity supported Standing balance-Leahy Scale: Fair                             ADL either performed or assessed with clinical judgement   ADL Overall ADL's : Needs assistance/impaired     Grooming: Sitting;Supervision/safety;Set up;Wash/dry face;Wash/dry hands;Cueing for sequencing Grooming Details (indicate cue type and reason): <25% VC to initiate/sequence Upper Body Bathing: Sitting;Minimal assistance;Cueing for sequencing Upper Body Bathing Details (indicate cue type and reason): <25% VC to initiate/sequence Lower Body Bathing: Sitting/lateral leans;Moderate assistance Lower Body Bathing Details (indicate cue type and reason): >75% VC to initiate/sequence Upper Body Dressing : Sitting;Minimal assistance Upper Body Dressing Details (indicate cue type and reason): <25% VC to initiate/sequence Lower Body Dressing: Moderate assistance;Sitting/lateral leans Lower Body Dressing Details (indicate cue type and reason): >75% VC to initiate/sequence  Vision Patient Visual Report: No change from baseline       Perception     Praxis       Pertinent Vitals/Pain Pain Assessment: Faces Faces Pain Scale: No hurt     Hand Dominance     Extremity/Trunk Assessment Upper Extremity Assessment Upper Extremity Assessment: Generalized weakness (tends to limit functional use of 1st and 2nd digits on L hand, some mild FMC impairment bilaterally)   Lower Extremity Assessment Lower Extremity Assessment: Generalized weakness       Communication Communication Communication: HOH   Cognition Arousal/Alertness: Awake/alert Behavior During Therapy: Anxious Overall Cognitive Status: Impaired/Different from baseline Area of Impairment: Following commands;Problem solving                       Following Commands: Follows one step commands with increased time;Follows multi-step commands inconsistently;Follows multi-step commands with increased time     Problem Solving: Slow processing;Decreased initiation;Difficulty sequencing;Requires verbal cues;Requires tactile cues General Comments: Very HOH   General Comments  HR up to 120's and RR up to high 30's - mid 40's briefly with LB bathing task but improves quickly with cues for deep breathing and rest break    Exercises Other Exercises Other Exercises: OT facilitated EOB UB/LB bathing, LB dressing, and application of lotion to UB/LB   Shoulder Instructions      Home Living Family/patient expects to be discharged to:: Private residence Living Arrangements: Spouse/significant other Available Help at Discharge: Family;Available PRN/intermittently Type of Home: House Home Access: Stairs to enter;Ramped entrance Entrance Stairs-Number of Steps: 3, does not have to use entrance with steps   Home Layout: One level               Home Equipment: Walker - 2 wheels;Cane - single point;Shower seat - built in;Bedside commode;Wheelchair - manual   Additional Comments: all above-listed equipment was stored, belonged to patient's grandmother.      Prior  Functioning/Environment Level of Independence: Independent        Comments: Pt was INDEP including driving and running errands, was declining and requiring increased assitance from spouse over course of 2 weeks d/t generally feeling poorly.        OT Problem List: Decreased strength;Decreased range of motion;Decreased activity tolerance;Impaired balance (sitting and/or standing);Decreased coordination;Decreased cognition;Decreased safety awareness;Decreased knowledge of use of DME or AE;Cardiopulmonary status limiting activity;Impaired UE functional use      OT Treatment/Interventions: Self-care/ADL training;DME and/or AE instruction;Therapeutic activities;Balance training;Therapeutic exercise;Energy conservation;Patient/family education    OT Goals(Current goals can be found in the care plan section) Acute Rehab OT Goals Patient Stated Goal: to get stronger OT Goal Formulation: With patient Time For Goal Achievement: 08/14/20 Potential to Achieve Goals: Good ADL Goals Additional ADL Goal #2: Pt will tolerate sitting EOB for seated ADL tasks for >81min with superivsion for safety.  OT Frequency: Min 3X/week   Barriers to D/C: Decreased caregiver support  husband is involved but works and needs to continue working       Intel OT "6 Clicks" Daily Activity     Outcome Measure Help from another person eating meals?: A Lot Help from another person taking care of personal grooming?: A Little Help from another person toileting, which includes using toliet, bedpan, or urinal?: A Lot Help from another person bathing (including washing, rinsing, drying)?: A Lot Help from another person to put on and taking off regular upper  body clothing?: A Little Help from another person to put on and taking off regular lower body clothing?: A Lot 6 Click Score: 14   End of Session    Activity Tolerance: Patient tolerated treatment well Patient left: in bed;with  call bell/phone within reach;with bed alarm set  OT Visit Diagnosis: Unsteadiness on feet (R26.81);Muscle weakness (generalized) (M62.81);Other symptoms and signs involving the nervous system (R29.898);Other symptoms and signs involving cognitive function                Time: 2035-5974 OT Time Calculation (min): 31 min Charges:  OT General Charges $OT Visit: 1 Visit OT Evaluation $OT Re-eval: 1 Re-eval OT Treatments $Self Care/Home Management : 23-37 mins  Wynona Canes, MPH, MS, OTR/L ascom [phone] 07/31/20, 4:18 PM

## 2020-08-01 ENCOUNTER — Encounter: Payer: Self-pay | Admitting: Internal Medicine

## 2020-08-01 ENCOUNTER — Other Ambulatory Visit: Payer: Self-pay

## 2020-08-01 ENCOUNTER — Other Ambulatory Visit: Payer: Self-pay | Admitting: Oncology

## 2020-08-01 DIAGNOSIS — I639 Cerebral infarction, unspecified: Secondary | ICD-10-CM

## 2020-08-01 DIAGNOSIS — G9340 Encephalopathy, unspecified: Secondary | ICD-10-CM | POA: Diagnosis not present

## 2020-08-01 MED ORDER — ENSURE ENLIVE PO LIQD: 237.0000 mL | Freq: Two times a day (BID) | ORAL | 12 refills | Status: DC

## 2020-08-01 MED ORDER — NICOTINE 21 MG/24HR TD PT24: 21.0000 mg | MEDICATED_PATCH | Freq: Every day | TRANSDERMAL | 0 refills | Status: DC

## 2020-08-01 MED ORDER — ASPIRIN 81 MG PO TBEC: 81.0000 mg | DELAYED_RELEASE_TABLET | Freq: Every day | ORAL | 11 refills | Status: AC

## 2020-08-01 MED ORDER — ACETAMINOPHEN 325 MG PO TABS: 650.0000 mg | ORAL_TABLET | Freq: Four times a day (QID) | ORAL | 0 refills | Status: AC | PRN

## 2020-08-01 MED ORDER — FUROSEMIDE 40 MG PO TABS: 40.0000 mg | ORAL_TABLET | Freq: Every day | ORAL | Status: DC

## 2020-08-01 MED ORDER — PREDNISONE 5 MG PO TABS: 10.0000 mg | ORAL_TABLET | Freq: Every day | ORAL | 0 refills | Status: DC

## 2020-08-01 MED ORDER — FOLIC ACID 1 MG PO TABS: 1.0000 mg | ORAL_TABLET | Freq: Every day | ORAL | 1 refills | Status: DC

## 2020-08-01 MED ORDER — SENNOSIDES-DOCUSATE SODIUM 8.6-50 MG PO TABS: 1.0000 | ORAL_TABLET | Freq: Two times a day (BID) | ORAL | 1 refills | Status: DC

## 2020-08-01 MED ORDER — PREDNISONE 20 MG PO TABS
10.0000 mg | ORAL_TABLET | Freq: Every day | ORAL | Status: DC
Start: 2020-08-02 — End: 2020-08-01

## 2020-08-01 MED ORDER — POLYETHYLENE GLYCOL 3350 17 G PO PACK: 17.0000 g | PACK | Freq: Every day | ORAL | 0 refills | Status: DC | PRN

## 2020-08-01 MED ORDER — ROSUVASTATIN CALCIUM 20 MG PO TABS: 20.0000 mg | ORAL_TABLET | Freq: Every day | ORAL | 1 refills | Status: AC

## 2020-08-01 NOTE — Discharge Summary (Signed)
Physician Discharge Summary  Brandi Holland ZOX:[id number] DOB: 27-Aug-1965 DOA: 07/16/2020  PCP: Oneita Hurt, No  Admit date: 07/16/2020 Discharge date: 08/01/2020  Admitted From: Home Disposition:  Home  Recommendations for Outpatient Follow-up:  Follow up with PCP in 1-2 weeks Follow-up with hematology Follow-up with neurology Please obtain BMP/CBC in one week Please follow up on the following pending results: None  Home Health: Yes Equipment/Devices: Rolling walker, wheelchair Discharge Condition: Stable CODE STATUS: Full Diet recommendation: Heart Healthy   Brief/Interim Summary: Brandi Holland is a 55 y.o. female with medical history significant of asthma, OSA, fibromyalgia who presents via EMS for shortness of breath.  She was hypoxic in 60s requiring 5 to 6 L of oxygen with intermittent nonrebreather. Initially admitting provider unable to obtained any history as there was no family member at bedside, no one could be reached on phone and patient was very altered.   Able to get hold of husband after multiple attempts at bedside, according to him patient was not feeling well for the past few weeks, she was admitted at [facility] on 06/18/2020 with lower extremity edema and was diuresed and discharged on 06/20/2020.  No echocardiogram done, CTA was negative for PE at that time, no significant abnormality on labs except sodium of 122 which improved to 130 and mild AKI, unknown baseline. Patient continued to feel bad and she was staying in bed for the past more than 2 weeks, unable to get out of bed, very poor p.o. intake.  Per husband she did had diarrhea with black color stool and he has seen some blood in stool last week, diarrhea lasted for 2 days and then resolved.  He did not noticed any hematemesis.  Patient does not drink alcohol but is a heavy smoker, smokes at least 2 packs/day.  Husband was able to brought her to ED 2 weeks ago and then she left after waiting couple of hours without being seen.  Per  husband she was refusing to go see a doctor, when she continued to deteriorate he asked her son who lives in [place] who drove from there and talked with her and called the EMS. Husband denies any recent upper respiratory illnesses, fever or chills.  He did noticed worsening shortness of breath.  Patient was mostly bedbound for the past 2 weeks, he was using diapers to help her as she was unable to go to the bathroom by herself due to profound weakness.   Patient remained very altered with intermittent desaturation, ABG with some respiratory alkalosis, no hypoxia.1/2 blood culture bottles with MSSE, MRI brain with multiple bilateral infarcts, worsening renal function so unable to obtain CTA, VQ scan ordered but got canceled due to recurrent desaturation.  D-dimer at 2.6, hemoglobin of 5.7, platelet counts of 97 this morning and there were 255 during recent hospitalization at [facility], BUN of 73 and creatinine of 2.99, sodium of 130, chloride of 96 and bicarb of 21.  Elevated T bili at 2.5 and INR of 1.5, lactic acidosis 3.2>>2.5>>1.9, elevated troponin 708>>904, BNP at 1893, CT chest and abdomen with bilateral groundglass opacities which can be due to pulmonary edema versus atypical pneumonia and no significant abnormality on CT abdomen.  Echocardiogram with EF of 50 to 55%, which is within lower normal limit, no wall motion abnormalities and indeterminate diastolic parameters, mildly biatrial dilatation and dilated inferior vena cava with less than 50% variability. Received total of 3 units PRBC 6/3:Had swallow evaluation today and they are recommending dysphagia 2 diet. 6/4:  Cardiology started her on diuresis with IV Lasix 40 mg daily, some improvement in breathing status. 6/5: Significant improvement in breathing status, worsening thrombocytopenia, started on prednisone 6/6: No new complaint, platelets started improving. 6/7: TEE got canceled again as patient was difficult to arouse this morning, apparently was  very agitated overnight and received a dose of Ativan, we will attempt again tomorrow.   Dr. Marylu Lund week. 6/8-6/14 Patient had her TEE done and there was no thrombus or PFO.  Her mental status has improved. She was weaned off of oxygen.  She is basically been waiting for SNF bed which case management is working on it. Cardiology Brandi Holland is a 55 y.o. female with medical history significant of asthma, OSA, fibromyalgia who presents via EMS for shortness of breath.  She was hypoxic in 60s requiring 5 to 6 L of oxygen with intermittent nonrebreather. Initially admitting provider unable to obtained any history as there was no family member at bedside, no one could be reached on phone and patient was very altered.   Able to get hold of husband after multiple attempts at bedside, according to him patient was not feeling well for the past few weeks, she was admitted at [facility] on 06/18/2020 with lower extremity edema and was diuresed and discharged on 06/20/2020.  No echocardiogram done, CTA was negative for PE at that time, no significant abnormality on labs except sodium of 122 which improved to 130 and mild AKI, unknown baseline. Patient continued to feel bad and she was staying in bed for the past more than 2 weeks, unable to get out of bed, very poor p.o. intake.  Per husband she did had diarrhea with black color stool and he has seen some blood in stool last week, diarrhea lasted for 2 days and then resolved.  He did not noticed any hematemesis.  Patient does not drink alcohol but is a heavy smoker, smokes at least 2 packs/day.  Husband was able to brought her to ED 2 weeks ago and then she left after waiting couple of hours without being seen.  Per husband she was refusing to go see a doctor, when she continued to deteriorate he asked her son who lives in [place] who drove from there and talked with her and called the EMS. Husband denies any recent upper respiratory illnesses, fever or chills.  He did noticed  worsening shortness of breath.  Patient was mostly bedbound for the past 2 weeks, he was using diapers to help her as she was unable to go to the bathroom by herself due to profound weakness.   Patient remained very altered with intermittent desaturation, ABG with some respiratory alkalosis, no hypoxia.1/2 blood culture bottles with MSSE, MRI brain with multiple bilateral infarcts, worsening renal function so unable to obtain CTA, VQ scan ordered but got canceled due to recurrent desaturation.  D-dimer at 2.6, hemoglobin of 5.7, platelet counts of 97 this morning and there were 255 during recent hospitalization at [facility], BUN of 73 and creatinine of 2.99, sodium of 130, chloride of 96 and bicarb of 21.  Elevated T bili at 2.5 and INR of 1.5, lactic acidosis 3.2>>2.5>>1.9, elevated troponin 708>>904, BNP at 1893, CT chest and abdomen with bilateral groundglass opacities which can be due to pulmonary edema versus atypical pneumonia and no significant abnormality on CT abdomen.  Echocardiogram with EF of 50 to 55%, which is within lower normal limit, no wall motion abnormalities and indeterminate diastolic parameters, mildly biatrial dilatation and dilated inferior  vena cava with less than 50% variability. Received total of 3 units PRBC and 2 doses of IV iron 6/3:Had swallow evaluation today and they are recommending dysphagia 2 diet. 6/4: Cardiology started her on diuresis with IV Lasix 40 mg daily, some improvement in breathing status. 6/5: Significant improvement in breathing status, worsening thrombocytopenia, started on prednisone 6/6: No new complaint, platelets started improving. 6/7: TEE got canceled again as patient was difficult to arouse this morning, apparently was very agitated overnight and received a dose of Ativan, we will attempt again tomorrow.   Patient had her TEE done and there was no thrombus or PFO.  Her mental status has improved. She was weaned off of oxygen.  She is basically been  waiting for SNF bed which case management is working on it. Cardiology will be placing Zio monitor at the time of discharge .  6/15. stable, waiting for insurance authorization.  Ordered COVID booster today.Zio monitor at the time of discharge , so please contact cardiology when she is ready to go to SNF.   6/15. stable, waiting for insurance authorization.  Ordered COVID booster today.  Our physical therapist initially recommended [facility], unable to secure a bed at [facility] due to her insurance, We tried placing her to SNF which remained unsuccessful due to her insurance issues, after having long discussions with her husband who decided to take her back home with home health services which were ordered.  During TEE he was also found to have severe aortic insufficiency, which will be reassessed as an outpatient setting and she will continue with daily Lasix.  No obvious reason was found for her cryptogenic stroke.  Therefore Zio monitor was placed and she will continue with aspirin and statin.  She was asked to follow-up with neurology and cardiology for further recommendations.  She was also found to have profound anemia and thrombocytopenia.  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  Her platelets started improving with prednisone.  Prednisone dose was slowly tapered later on and she was discharged on few more days of prednisone before stopping it.  Platelets were within normal limit on discharge.  CT abdomen was negative for liver cirrhosis.  1/2 blood cultures grew MSSA which was most likely a contaminant.  As repeat blood cultures were negative.  TTE and TEE was without any abnormality.  Infectious disease was also consulted and they confirmed that it is a contaminant and signed off.  Patient received antibiotics during initial course of illness and does not need anymore at this time.  She also developed multiple electrolyte  abnormalities which can also include hyponatremia which was corrected before discharge.  AKI resolved before discharge.  She will continue with current medications and need to follow-up with her providers for further recommendations.  Discharge Diagnoses:  Principal Problem:   Acute encephalopathy Active Problems:   Stroke (cerebrum) (HCC)   Asthma   Acute respiratory failure with hypoxia (HCC)   AKI (acute kidney injury) (HCC)   Hyponatremia   OSA (obstructive sleep apnea)   AMS (altered mental status)   Thrombocytopenia (HCC)   Acute on chronic diastolic heart failure (HCC)   Folate deficiency   Hyperbilirubinemia   SOB (shortness of breath)   Aortic valve regurgitation   Acute ITP (HCC)   Discharge Instructions  Discharge Instructions     Diet - low sodium heart healthy   Complete by: As directed    Increase activity slowly  Complete by: As directed       Allergies as of 08/01/2020       Reactions   Clarithromycin Swelling   Sulfa Antibiotics Swelling        Medication List     TAKE these medications    acetaminophen 325 MG tablet Commonly known as: TYLENOL Take 2 tablets (650 mg total) by mouth every 6 (six) hours as needed for mild pain (or Fever >/= 101).   aspirin 81 MG EC tablet Take 1 tablet (81 mg total) by mouth daily. Swallow whole.   clotrimazole 1 % cream Commonly known as: LOTRIMIN Apply 1 application topically 2 (two) times daily.   feeding supplement Liqd Take 237 mLs by mouth 2 (two) times daily between meals.   folic acid 1 MG tablet Commonly known as: FOLVITE Take 1 tablet (1 mg total) by mouth daily.   furosemide 40 MG tablet Commonly known as: LASIX Take 1 tablet (40 mg total) by mouth daily. What changed:  medication strength how much to take   nicotine 21 mg/24hr patch Commonly known as: NICODERM CQ - dosed in mg/24 hours Place 1 patch (21 mg total) onto the skin daily.   polyethylene glycol 17 g packet Commonly  known as: MIRALAX / GLYCOLAX Take 17 g by mouth daily as needed for mild constipation or moderate constipation.   predniSONE 5 MG tablet Commonly known as: DELTASONE Take 2 tablets (10 mg total) by mouth daily with breakfast. For 5 days, then take 1 tablet daily for 5 days, stop taking after that Start taking on: August 02, 2020   ProAir HFA 108 (90 Base) MCG/ACT inhaler Generic drug: albuterol Inhale 2 puffs into the lungs every 4 (four) hours as needed.   rosuvastatin 20 MG tablet Commonly known as: CRESTOR Take 1 tablet (20 mg total) by mouth daily.   senna-docusate 8.6-50 MG tablet Commonly known as: Senokot-S Take 1 tablet by mouth 2 (two) times daily.               Durable Medical Equipment  (From admission, onward)           Start     Ordered   08/01/20 1009  For home use only DME 4 wheeled rolling walker with seat  Once       Question:  Patient needs a walker to treat with the following condition  Answer:  Generalized weakness   08/01/20 1008   08/01/20 1009  For home use only DME 3 n 1  Once        08/01/20 1008   08/01/20 1009  For home use only DME Shower stool  Once        08/01/20 1008            Contact information for follow-up providers     Bjorn Pippin, MD. Schedule an appointment as soon as possible for a visit in 1 month(s).   Specialty: Neurology Contact information: [address] [place] [address] [phone]              Contact information for after-discharge care     Destination     [facility] .   Service: Skilled Nursing Contact information: [address] [place] [address] [phone]                    Allergies  Allergen Reactions   Clarithromycin Swelling   Sulfa Antibiotics Swelling    Consultations:  Neurology Cardiology ID Hematology  Procedures/Studies: CT Head Wo Contrast  Result Date: 07/16/2020 CLINICAL DATA:  Dizziness EXAM: CT HEAD  WITHOUT CONTRAST TECHNIQUE: Contiguous axial images were obtained from the base of the skull through the vertex without intravenous contrast. COMPARISON:  None. FINDINGS: Brain: Areas of low-density noted in both frontal lobes concerning for acute to subacute infarcts. No hemorrhage or hydrocephalus. Vascular: No hyperdense vessel or unexpected calcification. Skull: No acute calvarial abnormality. Sinuses/Orbits: No acute findings Other: None IMPRESSION: Areas of low-density in both frontal lobes concerning for acute to subacute infarcts. Electronically Signed   By: Charlett Nose M.D.   On: 07/16/2020 22:08   MR ANGIO HEAD WO CONTRAST  Result Date: 07/17/2020 CLINICAL DATA:  Subarachnoid hemorrhage EXAM: MRA HEAD WITHOUT CONTRAST TECHNIQUE: Angiographic images of the Circle of Willis were acquired using MRA technique without intravenous contrast. COMPARISON:  No pertinent prior exam. FINDINGS: POSTERIOR CIRCULATION: --Vertebral arteries: Normal --Inferior cerebellar arteries: Normal. --Basilar artery: Normal. --Superior cerebellar arteries: Normal. --Posterior cerebral arteries: Normal. ANTERIOR CIRCULATION: --Intracranial internal carotid arteries: Normal. --Anterior cerebral arteries (ACA): Normal. --Middle cerebral arteries (MCA): Normal. ANATOMIC VARIANTS: None IMPRESSION: Normal intracranial MRA. Electronically Signed   By: Deatra Robinson M.D.   On: 07/17/2020 02:39   MR Angiogram Neck W or Wo Contrast  Result Date: 07/17/2020 CLINICAL DATA:  Encephalopathy EXAM: MRA NECK WITHOUT AND WITH CONTRAST TECHNIQUE: Multiplanar and multiecho pulse sequences of the neck were obtained without and with intravenous contrast. Angiographic images of the neck were obtained using MRA technique without and with intravenous contrast. CONTRAST:  10mL GADAVIST GADOBUTROL 1 MMOL/ML IV SOLN COMPARISON:  None. FINDINGS: Images are degraded by motion. Within that limitation, there is no hemodynamically significant stenosis  visualized within the carotid systems. The vertebral arteries are left-dominant. Both vertebral arteries are normal. IMPRESSION: Motion degraded MRA of the neck without visible stenosis or acute abnormality of the carotid or vertebral arteries. Electronically Signed   By: Deatra Robinson M.D.   On: 07/17/2020 03:59   MR BRAIN WO CONTRAST  Result Date: 07/17/2020 CLINICAL DATA:  Encephalopathy EXAM: MRI HEAD WITHOUT CONTRAST TECHNIQUE: Multiplanar, multiecho pulse sequences of the brain and surrounding structures were obtained without intravenous contrast. COMPARISON:  None. FINDINGS: Brain: Multifocal bilateral acute ischemia. The largest areas of infarction are in the frontal lobes, but there are areas of ischemia in both parietal lobes, both occipital lobes and the right greater than left cerebellar hemispheres. No acute or chronic hemorrhage. There is multifocal hyperintense T2-weighted signal within the white matter. Parenchymal volume and CSF spaces are normal. The midline structures are normal. Vascular: Major flow voids are preserved. Skull and upper cervical spine: Normal calvarium and skull base. Visualized upper cervical spine and soft tissues are normal. Sinuses/Orbits:Left maxillary sinus mucosal thickening. No mastoid or middle ear effusion. Normal orbits. IMPRESSION: 1. Multifocal bilateral acute ischemia. The largest areas are in the frontal lobes, but there are lesions in multiple vascular territories bilaterally. This is most consistent with a cardiac or aortic embolic source. 2. No hemorrhage or mass effect. Electronically Signed   By: Deatra Robinson M.D.   On: 07/17/2020 02:33   US Abdomen Complete  Result Date: 07/17/2020 CLINICAL DATA:  Hyperbilirubinemia EXAM: ABDOMEN ULTRASOUND COMPLETE COMPARISON:  07/16/2020, 07/17/2020 FINDINGS: Gallbladder: Surgically absent Common bile duct: Diameter: 2 mm Liver: No focal lesion identified. Within normal limits in parenchymal echogenicity. Portal vein  is patent on color Doppler imaging with normal direction of blood flow towards the liver. IVC: No  abnormality visualized. Pancreas: Visualized portion unremarkable. Spleen: Size and appearance within normal limits. Right Kidney: Length: 9.3 cm. Echogenicity within normal limits. No mass or hydronephrosis visualized. Left Kidney: Length: 10.4 cm. Echogenicity within normal limits. No mass or hydronephrosis visualized. Abdominal aorta: Proximal aorta is unremarkable measuring up to 2.7 cm. Distal aorta and bifurcation obscured by bowel gas. Other findings: None. IMPRESSION: 1. Grossly unremarkable abdominal ultrasound in this patient status post cholecystectomy. Electronically Signed   By: Sharlet Salina M.D.   On: 07/17/2020 23:30   US RENAL  Result Date: 07/17/2020 CLINICAL DATA:  Acute kidney injury EXAM: RENAL / URINARY TRACT ULTRASOUND COMPLETE COMPARISON:  None. FINDINGS: Right Kidney: Renal measurements: 10.3 x 3.8 x 4.4 cm = volume: 90 mL. Echogenicity within normal limits. No mass or hydronephrosis visualized. Left Kidney: Renal measurements: 9.6 x 4.8 x 4.3 cm = volume: 95 mL. Echogenicity within normal limits. No mass or hydronephrosis visualized. Bladder: Appears normal for degree of bladder distention. Other: None. IMPRESSION: No evidence of hydronephrosis or other acute renal abnormality. Electronically Signed   By: Malachy Moan M.D.   On: 07/17/2020 15:29   US Venous Img Lower Bilateral (DVT)  Result Date: 07/18/2020 CLINICAL DATA:  Bilateral lower extremity swelling for the past 3 weeks. Evaluate for DVT. EXAM: BILATERAL LOWER EXTREMITY VENOUS DOPPLER ULTRASOUND TECHNIQUE: Gray-scale sonography with graded compression, as well as color Doppler and duplex ultrasound were performed to evaluate the lower extremity deep venous systems from the level of the common femoral vein and including the common femoral, femoral, profunda femoral, popliteal and calf veins including the posterior tibial,  peroneal and gastrocnemius veins when visible. The superficial great saphenous vein was also interrogated. Spectral Doppler was utilized to evaluate flow at rest and with distal augmentation maneuvers in the common femoral, femoral and popliteal veins. COMPARISON:  None. FINDINGS: Examination is degraded due to patient body habitus and poor sonographic window. RIGHT LOWER EXTREMITY Common Femoral Vein: No evidence of thrombus. Normal compressibility, respiratory phasicity and response to augmentation. Saphenofemoral Junction: No evidence of thrombus. Normal compressibility and flow on color Doppler imaging. Profunda Femoral Vein: No evidence of thrombus. Normal compressibility and flow on color Doppler imaging. Femoral Vein: No evidence of thrombus. Normal compressibility, respiratory phasicity and response to augmentation. Popliteal Vein: No evidence of thrombus. Normal compressibility, respiratory phasicity and response to augmentation. Calf Veins: No evidence of thrombus. Normal compressibility and flow on color Doppler imaging. Superficial Great Saphenous Vein: No evidence of thrombus. Normal compressibility. Venous Reflux:  None. Other Findings:  None. LEFT LOWER EXTREMITY Common Femoral Vein: No evidence of thrombus. Normal compressibility, respiratory phasicity and response to augmentation. Saphenofemoral Junction: No evidence of thrombus. Normal compressibility and flow on color Doppler imaging. Profunda Femoral Vein: No evidence of thrombus. Normal compressibility and flow on color Doppler imaging. Femoral Vein: No evidence of thrombus. Normal compressibility, respiratory phasicity and response to augmentation. Popliteal Vein: No evidence of thrombus. Normal compressibility, respiratory phasicity and response to augmentation. Calf Veins: No evidence of thrombus. Normal compressibility and flow on color Doppler imaging. Superficial Great Saphenous Vein: No evidence of thrombus. Normal compressibility. Venous  Reflux:  None. Other Findings:  None. IMPRESSION: No evidence of DVT within either lower extremity. Electronically Signed   By: Simonne Come M.D.   On: 07/18/2020 14:41   US Venous Img Upper Bilat (DVT)  Result Date: 07/18/2020 CLINICAL DATA:  Bilateral upper extremity pain and edema for the past several weeks. Evaluate for DVT. EXAM: BILATERAL UPPER EXTREMITY  VENOUS DOPPLER ULTRASOUND TECHNIQUE: Gray-scale sonography with graded compression, as well as color Doppler and duplex ultrasound were performed to evaluate the bilateral upper extremity deep venous systems from the level of the subclavian vein and including the jugular, axillary, basilic, radial, ulnar and upper cephalic vein. Spectral Doppler was utilized to evaluate flow at rest and with distal augmentation maneuvers. COMPARISON:  None. FINDINGS: RIGHT UPPER EXTREMITY Internal Jugular Vein: No evidence of thrombus. Normal compressibility, respiratory phasicity and response to augmentation. Subclavian Vein: No evidence of thrombus. Normal compressibility, respiratory phasicity and response to augmentation. Axillary Vein: No evidence of thrombus. Normal compressibility, respiratory phasicity and response to augmentation. Cephalic Vein: No evidence of thrombus. Normal compressibility, respiratory phasicity and response to augmentation. Basilic Vein: No evidence of thrombus. Normal compressibility, respiratory phasicity and response to augmentation. Brachial Veins: No evidence of thrombus. Normal compressibility, respiratory phasicity and response to augmentation. Radial Veins: No evidence of thrombus. Normal compressibility, respiratory phasicity and response to augmentation. Ulnar Veins: No evidence of thrombus. Normal compressibility, respiratory phasicity and response to augmentation. Venous Reflux:  None. Other Findings:  None. LEFT UPPER EXTREMITY Internal Jugular Vein: No evidence of thrombus. Normal compressibility, respiratory phasicity and response  to augmentation. Subclavian Vein: No evidence of thrombus. Normal compressibility, respiratory phasicity and response to augmentation. Axillary Vein: No evidence of thrombus. Normal compressibility, respiratory phasicity and response to augmentation. Cephalic Vein: While the proximal aspect of the cephalic vein appears widely patent (image 9), there is hypoechoic occlusive thrombus involving the mid aspect of the cephalic vein at the level of the mid humerus (images 10 and 12). Basilic Vein: No evidence of thrombus. Normal compressibility, respiratory phasicity and response to augmentation. Brachial Veins: No evidence of thrombus. Normal compressibility, respiratory phasicity and response to augmentation. Radial Veins: No evidence of thrombus. Normal compressibility, respiratory phasicity and response to augmentation. Ulnar Veins: No evidence of thrombus. Normal compressibility, respiratory phasicity and response to augmentation. Venous Reflux:  None. Other Findings:  None. IMPRESSION: 1. No evidence of DVT within either upper extremity. 2. Examination is positive for short-segment occlusive superficial thrombophlebitis involving mid humeral aspect of the cephalic vein. Electronically Signed   By: Simonne Come M.D.   On: 07/18/2020 14:40   DG Chest Port 1 View  Result Date: 07/18/2020 CLINICAL DATA:  Acute respiratory failure with hypoxia EXAM: PORTABLE CHEST 1 VIEW COMPARISON:  07/16/2020 FINDINGS: Single frontal view of the chest demonstrates an enlarged cardiac silhouette. Stable diffuse ground-glass airspace disease compatible with edema. No effusion or pneumothorax. No acute bony abnormalities. IMPRESSION: 1. Persistent bilateral ground-glass airspace disease consistent with pulmonary edema or atypical infection. No change since prior exam. Electronically Signed   By: Sharlet Salina M.D.   On: 07/18/2020 01:52   DG Chest Portable 1 View  Result Date: 07/16/2020 CLINICAL DATA:  Shortness of breath EXAM:  PORTABLE CHEST 1 VIEW COMPARISON:  None. FINDINGS: Low lung volumes. Mild cardiomegaly. Diffuse interstitial prominence throughout the lungs. No effusions or pneumothorax. No acute bony abnormality. IMPRESSION: Cardiomegaly. Diffuse interstitial prominence could reflect interstitial edema or less likely atypical infection. Electronically Signed   By: Charlett Nose M.D.   On: 07/16/2020 20:41   ECHOCARDIOGRAM COMPLETE  Result Date: 07/17/2020    ECHOCARDIOGRAM REPORT   Patient Name:   BRIARROSE SHOR Date of Exam: 07/17/2020 Medical Rec #:  [id number]       Height:       57.0 in Accession #:    [id number]      Weight:  231.5 lb Date of Birth:  28-Nov-1965        BSA:          1.914 m Patient Age:    55 years        BP:           106/49 mmHg Patient Gender: F               HR:           71 bpm. Exam Location:  [facility] Procedure: 2D Echo, Color Doppler, Cardiac Doppler and Intracardiac            Opacification Agent Indications:     I63.9 Stroke  History:         Patient has no prior history of Echocardiogram examinations.                  Signs/Symptoms:Shortness of Breath; Risk Factors:Sleep Apnea                  and Current Smoker.  Sonographer:     Humphrey Rolls RDCS (AE) Referring Phys:  [id number] Cecille Po MELVIN Diagnosing Phys: Lorine Bears MD  Sonographer Comments: Suboptimal apical window. Image acquisition challenging due to patient body habitus and Image acquisition challenging due to uncooperative patient. IMPRESSIONS  1. Left ventricular ejection fraction, by estimation, is 50 to 55%. The left ventricle has low normal function. The left ventricle has no regional wall motion abnormalities. Left ventricular diastolic parameters are indeterminate.  2. Right ventricular systolic function is normal. The right ventricular size is normal. Tricuspid regurgitation signal is inadequate for assessing PA pressure.  3. Left atrial size was mildly dilated.  4. Right atrial size was mildly dilated.  5. The mitral valve  is normal in structure. No evidence of mitral valve regurgitation. No evidence of mitral stenosis.  6. The aortic valve is normal in structure. Aortic valve regurgitation is mild. Mild aortic valve stenosis.  7. The inferior vena cava is dilated in size with <50% respiratory variability, suggesting right atrial pressure of 15 mmHg. FINDINGS  Left Ventricle: Left ventricular ejection fraction, by estimation, is 50 to 55%. The left ventricle has low normal function. The left ventricle has no regional wall motion abnormalities. Definity contrast agent was given IV to delineate the left ventricular endocardial borders. The left ventricular internal cavity size was normal in size. There is no left ventricular hypertrophy. Left ventricular diastolic parameters are indeterminate. Right Ventricle: The right ventricular size is normal. No increase in right ventricular wall thickness. Right ventricular systolic function is normal. Tricuspid regurgitation signal is inadequate for assessing PA pressure. Left Atrium: Left atrial size was mildly dilated. Right Atrium: Right atrial size was mildly dilated. Pericardium: There is no evidence of pericardial effusion. Mitral Valve: The mitral valve is normal in structure. No evidence of mitral valve regurgitation. No evidence of mitral valve stenosis. MV peak gradient, 3.7 mmHg. The mean mitral valve gradient is 2.0 mmHg. Tricuspid Valve: The tricuspid valve is normal in structure. Tricuspid valve regurgitation is mild . No evidence of tricuspid stenosis. Aortic Valve: The aortic valve is normal in structure. Aortic valve regurgitation is mild. Mild aortic stenosis is present. Aortic valve mean gradient measures 13.0 mmHg. Aortic valve peak gradient measures 20.8 mmHg. Aortic valve area, by VTI measures 1.59 cm. Pulmonic Valve: The pulmonic valve was normal in structure. Pulmonic valve regurgitation is trivial. No evidence of pulmonic stenosis. Aorta: The aortic root is normal in  size and structure. Venous:  The inferior vena cava is dilated in size with less than 50% respiratory variability, suggesting right atrial pressure of 15 mmHg. IAS/Shunts: No atrial level shunt detected by color flow Doppler.  LEFT VENTRICLE PLAX 2D LVIDd:         4.90 cm  Diastology LVIDs:         3.40 cm  LV e' medial:    7.51 cm/s LV PW:         1.10 cm  LV E/e' medial:  12.0 LV IVS:        0.80 cm  LV e' lateral:   7.72 cm/s LVOT diam:     2.00 cm  LV E/e' lateral: 11.7 LV SV:         65 LV SV Index:   34 LVOT Area:     3.14 cm  RIGHT VENTRICLE RV Basal diam:  3.50 cm LEFT ATRIUM             Index       RIGHT ATRIUM           Index LA diam:        4.50 cm 2.35 cm/m  RA Area:     21.50 cm LA Vol (A2C):   47.4 ml 24.77 ml/m RA Volume:   65.60 ml  34.27 ml/m LA Vol (A4C):   66.3 ml 34.64 ml/m LA Biplane Vol: 58.2 ml 30.41 ml/m  AORTIC VALVE                    PULMONIC VALVE AV Area (Vmax):    1.37 cm     PV Vmax:       1.10 m/s AV Area (Vmean):   1.20 cm     PV Vmean:      67.600 cm/s AV Area (VTI):     1.59 cm     PV VTI:        0.185 m AV Vmax:           228.00 cm/s  PV Peak grad:  4.8 mmHg AV Vmean:          168.000 cm/s PV Mean grad:  2.0 mmHg AV VTI:            0.407 m AV Peak Grad:      20.8 mmHg AV Mean Grad:      13.0 mmHg LVOT Vmax:         99.20 cm/s LVOT Vmean:        64.100 cm/s LVOT VTI:          0.206 m LVOT/AV VTI ratio: 0.51  AORTA Ao Root diam: 3.10 cm MITRAL VALVE MV Area (PHT): 4.89 cm    SHUNTS MV Area VTI:   2.10 cm    Systemic VTI:  0.21 m MV Peak grad:  3.7 mmHg    Systemic Diam: 2.00 cm MV Mean grad:  2.0 mmHg MV Vmax:       0.96 m/s MV Vmean:      64.1 cm/s MV Decel Time: 155 msec MV E velocity: 90.00 cm/s MV A velocity: 87.00 cm/s MV E/A ratio:  1.03 Lorine Bears MD Electronically signed by Lorine Bears MD Signature Date/Time: 07/17/2020/5:00:35 PM    Final    ECHO TEE  Result Date: 07/23/2020    TRANSESOPHOGEAL ECHO REPORT   Patient Name:   Brandi Holland Date of Exam:  07/23/2020 Medical Rec #:  [id number]       Height:  57.0 in Accession #:    [id number]      Weight:       202.2 lb Date of Birth:  03-22-1965        BSA:          1.807 m Patient Age:    55 years        BP:           110/62 mmHg Patient Gender: F               HR:           95 bpm. Exam Location:  [facility] Procedure: Transesophageal Echo, Cardiac Doppler and Color Doppler Indications:     Not listed on TEE check-in sheet  History:         Patient has prior history of Echocardiogram examinations, most                  recent 07/17/2020. Risk Factors:Sleep Apnea.  Sonographer:     Cristela Blue RDCS (AE) Referring Phys:  [id number] Lennon Alstrom Diagnosing Phys: Debbe Odea MD PROCEDURE: The transesophogeal probe was passed without difficulty through the esophogus of the patient. Sedation performed by performing physician. The patient developed no complications during the procedure. Study time, quality limited due to patient's  Poor respiratory function/respiratory distress. IMPRESSIONS  1. Left ventricular ejection fraction, by estimation, is 55 to 60%. The left ventricle has normal function.  2. Right ventricular systolic function is normal. The right ventricular size is normal.  3. No left atrial/left atrial appendage thrombus was detected.  4. The mitral valve is normal in structure. Mild mitral valve regurgitation.  5. 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 The aortic valve is tricuspid. Aortic valve regurgitation is severe.  6. Agitated saline contrast bubble study was negative, with no evidence of any interatrial shunt. FINDINGS  Left Ventricle: Left ventricular ejection fraction, by estimation, is 55 to 60%. The left ventricle has normal function. The left ventricular internal cavity size was normal in size. Right Ventricle: The right ventricular size is normal. No increase in right ventricular wall thickness. Right ventricular  systolic function is normal. Left Atrium: Left atrial size was normal in size. No left atrial/left atrial appendage thrombus was detected. Right Atrium: Right atrial size was normal in size. Pericardium: There is no evidence of pericardial effusion. Mitral Valve: The mitral valve is normal in structure. Mild mitral valve regurgitation. Tricuspid Valve: The tricuspid valve is normal in structure. Tricuspid valve regurgitation is not demonstrated. 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 The aortic valve is tricuspid. Aortic valve regurgitation is severe. Pulmonic Valve: The pulmonic valve was grossly normal. Pulmonic valve regurgitation is not visualized. Aorta: The aortic root is normal in size and structure. IAS/Shunts: No atrial level shunt detected by color flow Doppler. Agitated saline contrast was given intravenously to evaluate for intracardiac shunting. Agitated saline contrast bubble study was negative, with no evidence of any interatrial shunt. Debbe Odea MD Electronically signed by Debbe Odea MD Signature Date/Time: 07/23/2020/3:48:06 PM    Final    CT CHEST ABDOMEN PELVIS WO CONTRAST  Result Date: 07/16/2020 CLINICAL DATA:  Shortness of breath. EXAM: CT CHEST, ABDOMEN AND PELVIS WITHOUT CONTRAST TECHNIQUE: Multidetector CT imaging of the chest, abdomen and pelvis was performed following the standard protocol without IV contrast. COMPARISON:  None.  FINDINGS: CT CHEST FINDINGS Cardiovascular: Heart is mildly enlarged.  Aorta normal caliber. Mediastinum/Nodes: No mediastinal, hilar, or axillary adenopathy. Trachea and esophagus are unremarkable. Thyroid unremarkable. Lungs/Pleura: Ground-glass airspace opacities within the upper lobes bilaterally, left greater than right as well as left lower lobe. No effusions or pneumothorax. Musculoskeletal: Chest wall soft tissues are unremarkable. No acute bony  abnormality. CT ABDOMEN PELVIS FINDINGS Hepatobiliary: No focal liver abnormality is seen. Status post cholecystectomy. No biliary dilatation. Pancreas: No focal abnormality or ductal dilatation. Spleen: No focal abnormality.  Normal size. Adrenals/Urinary Tract: Fullness of the left adrenal gland compatible with hyperplasia. Right adrenal gland and kidneys unremarkable. No stones or hydronephrosis. Stomach/Bowel: Stomach, large and small bowel grossly unremarkable. Vascular/Lymphatic: Aortic atherosclerosis. No evidence of aneurysm or adenopathy. Reproductive: Prior hysterectomy.  No adnexal masses. Other: No free fluid or free air. Musculoskeletal: No acute bony abnormality. IMPRESSION: Cardiomegaly. Mild ground-glass opacities in the lungs, left greater than right. This could reflect asymmetric edema or infection. No acute findings in the abdomen or pelvis. Aortic atherosclerosis. Electronically Signed   By: Charlett NoseKevin  Dover M.D.   On: 07/16/2020 22:13    Subjective: Patient was seen and examined on the day of discharge.  Denies any new complaints.  Eating and drinking okay.  Regular bowel movements.  Discharge Exam: Vitals:   08/01/20 0808 08/01/20 1105  BP: 114/63 (!) 107/45  Pulse: (!) 102 92  Resp: 20 16  Temp: 98.7 F (37.1 C) 98.2 F (36.8 C)  SpO2: 98% 100%   Vitals:   08/01/20 0023 08/01/20 0450 08/01/20 0808 08/01/20 1105  BP: 112/64 130/67 114/63 (!) 107/45  Pulse: 85 87 (!) 102 92  Resp: 14 16 20 16   Temp: 98.9 F (37.2 C) 98.1 F (36.7 C) 98.7 F (37.1 C) 98.2 F (36.8 C)  TempSrc: Oral Oral Oral   SpO2: 97% 98% 98% 100%  Weight:      Height:        General: Pt is alert, awake, not in acute distress, hard of hearing. Cardiovascular: RRR, S1/S2 +, no rubs, no gallops Respiratory: CTA bilaterally, no wheezing, no rhonchi Abdominal: Soft, NT, ND, bowel sounds + Extremities: no edema, no cyanosis   The results of significant diagnostics from this hospitalization  (including imaging, microbiology, ancillary and laboratory) are listed below for reference.    Microbiology: No results found for this or any previous visit (from the past 240 hour(s)).   Labs: BNP (last 3 results) Recent Labs    07/17/20 0025 07/17/20 0534 07/26/20 0509  BNP 1,768.8* 1,893.4* 294.1*   Basic Metabolic Panel: Recent Labs  Lab 07/27/20 0816 07/31/20 0515  NA 136 136  K 3.9 3.5  CL 102 102  CO2 27 28  GLUCOSE 91 84  BUN 34* 31*  CREATININE 0.79 0.83  CALCIUM 9.1 9.2   Liver Function Tests: No results for input(s): AST, ALT, ALKPHOS, BILITOT, PROT, ALBUMIN in the last 168 hours. No results for input(s): LIPASE, AMYLASE in the last 168 hours. No results for input(s): AMMONIA in the last 168 hours. CBC: Recent Labs  Lab 07/26/20 0509 07/31/20 0515  WBC 8.6 10.7*  NEUTROABS 4.8  --   HGB 9.8* 10.2*  HCT 31.2* 32.0*  MCV 101.0* 100.6*  PLT 137* 264   Cardiac Enzymes: No results for input(s): CKTOTAL, CKMB, CKMBINDEX, TROPONINI in the last 168 hours. BNP: Invalid input(s): POCBNP CBG: No results for input(s): GLUCAP in the last 168 hours. D-Dimer No results for input(s): DDIMER in the  last 72 hours. Hgb A1c No results for input(s): HGBA1C in the last 72 hours. Lipid Profile No results for input(s): CHOL, HDL, LDLCALC, TRIG, CHOLHDL, LDLDIRECT in the last 72 hours. Thyroid function studies No results for input(s): TSH, T4TOTAL, T3FREE, THYROIDAB in the last 72 hours.  Invalid input(s): FREET3 Anemia work up No results for input(s): VITAMINB12, FOLATE, FERRITIN, TIBC, IRON, RETICCTPCT in the last 72 hours. Urinalysis    Component Value Date/Time   COLORURINE AMBER (A) 07/19/2020 1033   APPEARANCEUR CLOUDY (A) 07/19/2020 1033   APPEARANCEUR Clear 12/03/2013 2107   LABSPEC 1.024 07/19/2020 1033   LABSPEC 1.003 12/03/2013 2107   PHURINE 5.0 07/19/2020 1033   GLUCOSEU NEGATIVE 07/19/2020 1033   GLUCOSEU Negative 12/03/2013 2107   HGBUR  MODERATE (A) 07/19/2020 1033   BILIRUBINUR NEGATIVE 07/19/2020 1033   BILIRUBINUR Negative 12/03/2013 2107   KETONESUR NEGATIVE 07/19/2020 1033   PROTEINUR 30 (A) 07/19/2020 1033   NITRITE NEGATIVE 07/19/2020 1033   LEUKOCYTESUR MODERATE (A) 07/19/2020 1033   LEUKOCYTESUR Negative 12/03/2013 2107   Sepsis Labs Invalid input(s): PROCALCITONIN,  WBC,  LACTICIDVEN Microbiology No results found for this or any previous visit (from the past 240 hour(s)).  Time coordinating discharge: Over 30 minutes  SIGNED:  Arnetha Courser, MD  [facility] 08/01/2020, 11:13 AM  If 7PM-7AM, please contact night-coverage [email or web address]  This record has been created using Conservation officer, historic buildings. Errors have been sought and corrected,but may not always be located. Such creation errors do not reflect on the standard of care.

## 2020-08-01 NOTE — TOC Transition Note (Addendum)
Transition of Care [facility]) - CM/SW Discharge Note   Patient Details  Name: Brandi Holland MRN: [id number] Date of Birth: 12-31-1965  Transition of [facility] CM/SW Contact:  Chapman Fitch, RN Phone Number: 08/01/2020, 1:14 PM   Clinical Narrative:    Sherron Monday with Delorise Shiner at [facility].  Due to insurance they are unable to offer a bed any longer Spoke with husband via phone There are no other bed offers  He is in agreement for patient to return home today He is off work Saturday and Sunday.  He will be staying with the patient.  In the mean time he will be working with family to arrange care during the day while he works.   Printed PCS list and left on the chart for discharge He is also going to follow up with dss about potentially get Medicaid PCS services   Husband agreeable for home health services. Does  not have a preference of home health agency. Only agnecy able to accept is [facility],  They can offer PT and OT services only.  Vm left for husband to update  BSC, Rw and shower seat to be delivered to room by [place] with [employer] prior to discharge   Kandee Keen with [facility] notified of discharge    Update spoke with husband he will be here at 230 for pick up.  Husband states that they already have BSC, RW and shower seat in the home.    Final next level of care: Home w Home Health Services Barriers to Discharge: No Barriers Identified   Patient Goals and CMS Choice        Discharge Placement                Patient to be transferred to facility by: Husband Name of family member notified: Husband Patient and family notified of of transfer: 08/01/20  Discharge Plan and Services                DME Arranged: 3-N-1, Walker rolling, Shower stool DME Agency: [employer] Date DME Agency Contacted: 08/01/20   Representative spoke with at DME Agency: Pamelia Hoit HH Arranged: PT, OT HH Agency: [facility] Date [facility] Agency Contacted: 08/01/20   Representative spoke with at  [facility] Agency: Kandee Keen  Social Determinants of Health (SDOH) Interventions     Readmission Risk Interventions No flowsheet data found.

## 2020-08-01 NOTE — Progress Notes (Signed)
Physical Therapy Treatment Patient Details Name: Brandi Holland MRN: [id number] DOB: 23-Aug-1965 Today's Date: 08/01/2020    History of Present Illness Pt is a 55 y/o F with PMH: unremarkable due to noncompliance with medical care. Pt presented to ED with 2-week history of feeling unwell/acting confused. Pt was seen at an OSH for concern for fluid overload with CHF as differentials. [facility]: MRI showing multiple embolic looking infarctions in bilateral hemispheres concerning for a central embolic source. 2D Echo: unlikely HF, but does show left and right atrial enlargement. Blood cultures + for staph epi. Pt adm for encephalopathy with CVAs and ICU d/t respiratory failure.    PT Comments    Ready for session.  To EOB with supervision.  Assist to don robes "I'm not doing it right."  She is able to stand and walk 1 lap around unit today with HR in 120's.  She does report fatigue and has some difficulty following cues but seems to have some improved awareness of walker position today.  "I'm sorry" when she runs into nursing desk. After seated rest she is able to tolerate standing ex 3 x 10 with self initiated rest breaks as needed.  She is unable to process when asked to take a step backwards.  She needs step by step cues to complete task but she is able to initiate on her own.  When asked to lay back down she sits still at EOB.  After a few moments "I forgot, I do that."  She continues to display difficulty processing ADL's and simple task.    Per discussion with TOC and MD, plan is to discharge home today with husband and equipment.     Follow Up Recommendations  CIR;Other (comment)     Equipment Recommendations  Rolling walker with 5" wheels;3in1 (PT)    Recommendations for Other Services       Precautions / Restrictions Precautions Precautions: Fall Restrictions Weight Bearing Restrictions: No    Mobility  Bed Mobility Overal bed mobility: Needs Assistance Bed Mobility:  Supine to Sit;Sit to Supine     Supine to sit: Supervision Sit to supine: Supervision   General bed mobility comments: VC to initiate    Transfers Overall transfer level: Needs assistance Equipment used: Rolling walker (2 wheeled) Transfers: Sit to/from Stand Sit to Stand: Min guard            Ambulation/Gait Ambulation/Gait assistance: Min guard;Min assist Gait Distance (Feet): 160 Feet Assistive device: Rolling walker (2 wheeled) Gait Pattern/deviations: Step-to pattern;Drifts right/left Gait velocity: decreased       Stairs             Wheelchair Mobility    Modified Rankin (Stroke Patients Only)       Balance Overall balance assessment: Needs assistance Sitting-balance support: Single extremity supported;No upper extremity supported;Feet supported;Feet unsupported Sitting balance-Leahy Scale: Fair     Standing balance support: Bilateral upper extremity supported Standing balance-Leahy Scale: Fair Standing balance comment: UE support for balance                            Cognition Arousal/Alertness: Awake/alert Behavior During Therapy: WFL for tasks assessed/performed Overall Cognitive Status: Impaired/Different from baseline                                        Exercises Other Exercises  Other Exercises: standing ex 3 x 10 with walker for support    General Comments        Pertinent Vitals/Pain Pain Assessment: No/denies pain Pain Intervention(s): Monitored during session    Home Living                      Prior Function            PT Goals (current goals can now be found in the care plan section) Progress towards PT goals: Progressing toward goals    Frequency    7X/week      PT Plan Current plan remains appropriate;Other (comment)    Co-evaluation              AM-PAC PT "6 Clicks" Mobility   Outcome Measure  Help needed turning from your back to your side while in a  flat bed without using bedrails?: None Help needed moving from lying on your back to sitting on the side of a flat bed without using bedrails?: None Help needed moving to and from a bed to a chair (including a wheelchair)?: A Little Help needed standing up from a chair using your arms (e.g., wheelchair or bedside chair)?: A Little Help needed to walk in hospital room?: A Little Help needed climbing 3-5 steps with a railing? : A Little 6 Click Score: 20    End of Session Equipment Utilized During Treatment: Gait belt Activity Tolerance: Patient tolerated treatment well Patient left: in bed;with call bell/phone within reach;with bed alarm set Nurse Communication: Mobility status PT Visit Diagnosis: Muscle weakness (generalized) (M62.81);Difficulty in walking, not elsewhere classified (R26.2);Apraxia (R48.2)     Time: 4034-7425 PT Time Calculation (min) (ACUTE ONLY): 24 min  Charges:  $Gait Training: 8-22 mins $Therapeutic Exercise: 8-22 mins                    Danielle Dess, PTA 08/01/20, 10:09 AM , 10:02 AM

## 2020-08-01 NOTE — Discharge Instructions (Signed)
It was pleasure taking care of you. You are being started on some new medications for your stroke, please take it as directed. You will take 10 mg of prednisone for 5 days, then decrease to 5 mg for another 5 days, then stop taking them. Follow up with cardiology as directed as you are being provided with a heart monitor. Follow up with Neurology about your stoke.

## 2020-08-04 ENCOUNTER — Telehealth: Payer: Self-pay | Admitting: Oncology

## 2020-08-04 NOTE — Telephone Encounter (Signed)
Attempted to contact her via listed contact number and it is currently not in working order. Called the number listed for her husband with no answer also. Left VM that patient needed to contact us to schedule a Hospital Follow-up per Dr. Cathie Hoops request.

## 2020-08-12 ENCOUNTER — Other Ambulatory Visit: Payer: Self-pay

## 2020-08-12 DIAGNOSIS — D696 Thrombocytopenia, unspecified: Secondary | ICD-10-CM

## 2020-08-13 ENCOUNTER — Encounter: Payer: Self-pay | Admitting: Oncology

## 2020-08-13 ENCOUNTER — Encounter (INDEPENDENT_AMBULATORY_CARE_PROVIDER_SITE_OTHER): Payer: Self-pay

## 2020-08-13 ENCOUNTER — Inpatient Hospital Stay (HOSPITAL_BASED_OUTPATIENT_CLINIC_OR_DEPARTMENT_OTHER): Payer: Medicaid Other | Admitting: Oncology

## 2020-08-13 ENCOUNTER — Inpatient Hospital Stay: Payer: Medicaid Other | Attending: Oncology

## 2020-08-13 ENCOUNTER — Other Ambulatory Visit: Payer: Self-pay

## 2020-08-13 VITALS — BP 130/73 | HR 91 | Temp 98.2°F | Resp 17 | Wt 189.0 lb

## 2020-08-13 DIAGNOSIS — E538 Deficiency of other specified B group vitamins: Secondary | ICD-10-CM | POA: Insufficient documentation

## 2020-08-13 DIAGNOSIS — D539 Nutritional anemia, unspecified: Secondary | ICD-10-CM | POA: Insufficient documentation

## 2020-08-13 DIAGNOSIS — D696 Thrombocytopenia, unspecified: Secondary | ICD-10-CM

## 2020-08-13 LAB — HEPATIC FUNCTION PANEL
ALT: 18 U/L (ref 0–44)
AST: 22 U/L (ref 15–41)
Albumin: 3.3 g/dL — ABNORMAL LOW (ref 3.5–5.0)
Alkaline Phosphatase: 84 U/L (ref 38–126)
Bilirubin, Direct: <0.1 mg/dL (ref 0.0–0.2)
Total Bilirubin: 0.5 mg/dL (ref 0.3–1.2)
Total Protein: 7.2 g/dL (ref 6.5–8.1)

## 2020-08-13 LAB — CBC WITH DIFFERENTIAL/PLATELET
Abs Immature Granulocytes: 0.05 10*3/uL (ref 0.00–0.07)
Basophils Absolute: 0 10*3/uL (ref 0.0–0.1)
Basophils Relative: 0 %
Eosinophils Absolute: 0.1 10*3/uL (ref 0.0–0.5)
Eosinophils Relative: 1 %
HCT: 35.5 % — ABNORMAL LOW (ref 36.0–46.0)
Hemoglobin: 11.2 g/dL — ABNORMAL LOW (ref 12.0–15.0)
Immature Granulocytes: 1 %
Lymphocytes Relative: 19 %
Lymphs Abs: 1.7 10*3/uL (ref 0.7–4.0)
MCH: 32 pg (ref 26.0–34.0)
MCHC: 31.5 g/dL (ref 30.0–36.0)
MCV: 101.4 fL — ABNORMAL HIGH (ref 80.0–100.0)
Monocytes Absolute: 0.4 10*3/uL (ref 0.1–1.0)
Monocytes Relative: 5 %
Neutro Abs: 6.8 10*3/uL (ref 1.7–7.7)
Neutrophils Relative %: 74 %
Platelets: 222 10*3/uL (ref 150–400)
RBC: 3.5 MIL/uL — ABNORMAL LOW (ref 3.87–5.11)
RDW: 16.1 % — ABNORMAL HIGH (ref 11.5–15.5)
WBC: 9.1 10*3/uL (ref 4.0–10.5)
nRBC: 0 % (ref 0.0–0.2)

## 2020-08-13 LAB — IMMATURE PLATELET FRACTION: Immature Platelet Fraction: 2.9 % (ref 1.2–8.6)

## 2020-08-13 MED ORDER — PREDNISONE 1 MG PO TABS: 1.0000 mg | ORAL_TABLET | ORAL | 0 refills | Status: DC

## 2020-08-13 NOTE — Progress Notes (Signed)
No new concerns today 

## 2020-08-13 NOTE — Progress Notes (Signed)
Hematology/Oncology Follow Up Note [facility]  Telephone:([phone] [phone]  Patient Care Team: Pcp, No as PCP - General   Name of the patient: Brandi Holland  [id number]  10/30/1965   REASON FOR VISIT Follow-up for anemia and thrombocytopenia  INTERVAL HISTORY 07/16/2020 - 08/01/2020 patient was admitted due to acute respiratory failure, change of mental status severe hyponatremia.  CT angiogram was negative for PE.  Acute mental status changes was felt to be secondary to stroke on MRI.  Cryptogenic  source.   TEE was negative for vegetations.   During her hospitalization patient was found to have anemia and thrombocytopenia.  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  DRV VT negative, patient was started on prednisone 40 mg in the hospital and a tapering down gradually.  At discharge her thrombocytopenia has completely resolved and prednisone was decreased to 10 mg. Anemia also improved.  Work-up showed no schistocytes, MAH A.  Today patient presented for posthospitalization follow-up. She was accompanied by her husband.  Patient reports feeling well. Patient husbands reports that he has misunderstood the prescription of prednisone and has been given patient only prednisone 5 mg daily.  Patient reports appetite is good.  Mental status at baseline.    Review of Systems  Constitutional:  Negative for appetite change, chills, fatigue and fever.  HENT:   Negative for hearing loss and voice change.   Eyes:  Negative for eye problems.  Respiratory:  Negative for chest tightness and cough.   Cardiovascular:  Negative for chest pain.  Gastrointestinal:  Negative for abdominal distention, abdominal pain and blood in stool.  Endocrine: Negative for hot flashes.  Genitourinary:  Negative for difficulty urinating and frequency.   Musculoskeletal:  Negative for arthralgias.  Skin:   Negative for itching and rash.  Neurological:  Negative for extremity weakness.  Hematological:  Negative for adenopathy.  Psychiatric/Behavioral:  Negative for confusion.      Allergies  Allergen Reactions   Clarithromycin Swelling   Sulfa Antibiotics Swelling     Past Medical History:  Diagnosis Date   Arthritis    Asthma    Fibromyalgia    OSA (obstructive sleep apnea)    Peripheral neuropathy      Past Surgical History:  Procedure Laterality Date   TEE WITHOUT CARDIOVERSION N/A 07/23/2020   Procedure: TRANSESOPHAGEAL ECHOCARDIOGRAM (TEE);  Surgeon: Debbe Odea, MD;  Location: [facility] ORS;  Service: Cardiovascular;  Laterality: N/A;    Social History   Socioeconomic History   Marital status: Married    Spouse name: Not on file   Number of children: Not on file   Years of education: Not on file   Highest education level: Not on file  Occupational History   Not on file  Tobacco Use   Smoking status: Not on file   Smokeless tobacco: Not on file  Substance and Sexual Activity   Alcohol use: Not on file   Drug use: Not on file   Sexual activity: Not on file  Other Topics Concern   Not on file  Social History Narrative   Not on file   Social Determinants of Health   Financial Resource Strain: Not on file  Food Insecurity: Not on file  Transportation Needs: Not on file  Physical Activity: Not on file  Stress: Not on file  Social Connections: Not on file  Intimate Partner Violence: Not on file    Family History  Problem  Relation Age of Onset   Chorea Mother    Emphysema Mother    Hypertension Mother    Thyroid disease Mother    Ulcers Mother    Dementia Father    Cancer Father    Cancer Sister    Chorea Sister    Heart disease Sister    Osteoporosis Sister      Current Outpatient Medications:    acetaminophen (TYLENOL) 325 MG tablet, Take 2 tablets (650 mg total) by mouth every 6 (six) hours as needed for mild pain (or Fever >/= 101)., Disp:  90 tablet, Rfl: 0   aspirin EC 81 MG EC tablet, Take 1 tablet (81 mg total) by mouth daily. Swallow whole., Disp: 30 tablet, Rfl: 11   clotrimazole (LOTRIMIN) 1 % cream, Apply 1 application topically 2 (two) times daily., Disp: , Rfl:    folic acid (FOLVITE) 1 MG tablet, Take 1 tablet (1 mg total) by mouth daily., Disp: 90 tablet, Rfl: 1   furosemide (LASIX) 40 MG tablet, Take 1 tablet (40 mg total) by mouth daily., Disp: 30 tablet, Rfl:    nicotine (NICODERM CQ - DOSED IN MG/24 HOURS) 21 mg/24hr patch, Place 1 patch (21 mg total) onto the skin daily., Disp: 28 patch, Rfl: 0   predniSONE (DELTASONE) 1 MG tablet, Take 1 tablet (1 mg total) by mouth See admin instructions. Take  daily for 1 week, then  daily for 1 week, then  daily for 1 week, then stop., Disp: 42 tablet, Rfl: 0   PROAIR HFA 108 (90 Base) MCG/ACT inhaler, Inhale 2 puffs into the lungs every 4 (four) hours as needed., Disp: , Rfl:    rosuvastatin (CRESTOR) 20 MG tablet, Take 1 tablet (20 mg total) by mouth daily., Disp: 90 tablet, Rfl: 1   senna-docusate (SENOKOT-S) 8.6-50 MG tablet, Take 1 tablet by mouth 2 (two) times daily., Disp: 60 tablet, Rfl: 1   feeding supplement (ENSURE ENLIVE / ENSURE PLUS) LIQD, Take 237 mLs by mouth 2 (two) times daily between meals. (Patient not taking: Reported on 08/13/2020), Disp: 237 mL, Rfl: 12   polyethylene glycol (MIRALAX / GLYCOLAX) 17 g packet, Take 17 g by mouth daily as needed for mild constipation or moderate constipation. (Patient not taking: Reported on 08/13/2020), Disp: 14 each, Rfl: 0  Physical exam:  Vitals:   08/13/20 1126  BP: 130/73  Pulse: 91  Resp: 17  Temp: 98.2 F (36.8 C)  TempSrc: Tympanic  SpO2: 100%  Weight: 189 lb (85.7 kg)   Physical Exam Constitutional:      General: She is not in acute distress.    Appearance: She is obese.     Comments: Patient sits in the wheelchair  HENT:     Head: Normocephalic and atraumatic.  Eyes:     General: No scleral  icterus. Cardiovascular:     Rate and Rhythm: Normal rate and regular rhythm.     Heart sounds: Normal heart sounds.  Pulmonary:     Effort: Pulmonary effort is normal. No respiratory distress.     Breath sounds: No wheezing.  Abdominal:     General: Bowel sounds are normal. There is no distension.     Palpations: Abdomen is soft.  Musculoskeletal:        General: No deformity. Normal range of motion.     Cervical back: Normal range of motion and neck supple.  Skin:    General: Skin is warm and dry.     Findings: No erythema or  rash.  Neurological:     Mental Status: She is alert. Mental status is at baseline.     Cranial Nerves: No cranial nerve deficit.     Coordination: Coordination normal.  Psychiatric:        Mood and Affect: Mood normal.    CMP Latest Ref Rng & Units 08/13/2020  Glucose 70 - 99 mg/dL -  BUN 6 - 20 mg/dL -  Creatinine 6.96 - 2.95 mg/dL -  Sodium 284 - 132 mmol/L -  Potassium 3.5 - 5.1 mmol/L -  Chloride 98 - 111 mmol/L -  CO2 22 - 32 mmol/L -  Calcium 8.9 - 10.3 mg/dL -  Total Protein 6.5 - 8.1 g/dL 7.2  Total Bilirubin 0.3 - 1.2 mg/dL 0.5  Alkaline Phos 38 - 126 U/L 84  AST 15 - 41 U/L 22  ALT 0 - 44 U/L 18   CBC Latest Ref Rng & Units 08/13/2020  WBC 4.0 - 10.5 K/uL 9.1  Hemoglobin 12.0 - 15.0 g/dL 11.2(L)  Hematocrit 36.0 - 46.0 % 35.5(L)  Platelets 150 - 400 K/uL 222    RADIOGRAPHIC STUDIES: I have personally reviewed the radiological images as listed and agreed with the findings in the report. CT Head Wo Contrast  Result Date: 07/16/2020 CLINICAL DATA:  Dizziness EXAM: CT HEAD WITHOUT CONTRAST TECHNIQUE: Contiguous axial images were obtained from the base of the skull through the vertex without intravenous contrast. COMPARISON:  None. FINDINGS: Brain: Areas of low-density noted in both frontal lobes concerning for acute to subacute infarcts. No hemorrhage or hydrocephalus. Vascular: No hyperdense vessel or unexpected calcification. Skull: No  acute calvarial abnormality. Sinuses/Orbits: No acute findings Other: None IMPRESSION: Areas of low-density in both frontal lobes concerning for acute to subacute infarcts. Electronically Signed   By: Charlett Nose M.D.   On: 07/16/2020 22:08   MR ANGIO HEAD WO CONTRAST  Result Date: 07/17/2020 CLINICAL DATA:  Subarachnoid hemorrhage EXAM: MRA HEAD WITHOUT CONTRAST TECHNIQUE: Angiographic images of the Circle of Willis were acquired using MRA technique without intravenous contrast. COMPARISON:  No pertinent prior exam. FINDINGS: POSTERIOR CIRCULATION: --Vertebral arteries: Normal --Inferior cerebellar arteries: Normal. --Basilar artery: Normal. --Superior cerebellar arteries: Normal. --Posterior cerebral arteries: Normal. ANTERIOR CIRCULATION: --Intracranial internal carotid arteries: Normal. --Anterior cerebral arteries (ACA): Normal. --Middle cerebral arteries (MCA): Normal. ANATOMIC VARIANTS: None IMPRESSION: Normal intracranial MRA. Electronically Signed   By: Deatra Robinson M.D.   On: 07/17/2020 02:39   MR Angiogram Neck W or Wo Contrast  Result Date: 07/17/2020 CLINICAL DATA:  Encephalopathy EXAM: MRA NECK WITHOUT AND WITH CONTRAST TECHNIQUE: Multiplanar and multiecho pulse sequences of the neck were obtained without and with intravenous contrast. Angiographic images of the neck were obtained using MRA technique without and with intravenous contrast. CONTRAST:  10mL GADAVIST GADOBUTROL 1 MMOL/ML IV SOLN COMPARISON:  None. FINDINGS: Images are degraded by motion. Within that limitation, there is no hemodynamically significant stenosis visualized within the carotid systems. The vertebral arteries are left-dominant. Both vertebral arteries are normal. IMPRESSION: Motion degraded MRA of the neck without visible stenosis or acute abnormality of the carotid or vertebral arteries. Electronically Signed   By: Deatra Robinson M.D.   On: 07/17/2020 03:59   MR BRAIN WO CONTRAST  Result Date: 07/17/2020 CLINICAL  DATA:  Encephalopathy EXAM: MRI HEAD WITHOUT CONTRAST TECHNIQUE: Multiplanar, multiecho pulse sequences of the brain and surrounding structures were obtained without intravenous contrast. COMPARISON:  None. FINDINGS: Brain: Multifocal bilateral acute ischemia. The largest areas of infarction are in  the frontal lobes, but there are areas of ischemia in both parietal lobes, both occipital lobes and the right greater than left cerebellar hemispheres. No acute or chronic hemorrhage. There is multifocal hyperintense T2-weighted signal within the white matter. Parenchymal volume and CSF spaces are normal. The midline structures are normal. Vascular: Major flow voids are preserved. Skull and upper cervical spine: Normal calvarium and skull base. Visualized upper cervical spine and soft tissues are normal. Sinuses/Orbits:Left maxillary sinus mucosal thickening. No mastoid or middle ear effusion. Normal orbits. IMPRESSION: 1. Multifocal bilateral acute ischemia. The largest areas are in the frontal lobes, but there are lesions in multiple vascular territories bilaterally. This is most consistent with a cardiac or aortic embolic source. 2. No hemorrhage or mass effect. Electronically Signed   By: Deatra RobinsonKevin  Herman M.D.   On: 07/17/2020 02:33   US Abdomen Complete  Result Date: 07/17/2020 CLINICAL DATA:  Hyperbilirubinemia EXAM: ABDOMEN ULTRASOUND COMPLETE COMPARISON:  07/16/2020, 07/17/2020 FINDINGS: Gallbladder: Surgically absent Common bile duct: Diameter: 2 mm Liver: No focal lesion identified. Within normal limits in parenchymal echogenicity. Portal vein is patent on color Doppler imaging with normal direction of blood flow towards the liver. IVC: No abnormality visualized. Pancreas: Visualized portion unremarkable. Spleen: Size and appearance within normal limits. Right Kidney: Length: 9.3 cm. Echogenicity within normal limits. No mass or hydronephrosis visualized. Left Kidney: Length: 10.4 cm. Echogenicity within normal  limits. No mass or hydronephrosis visualized. Abdominal aorta: Proximal aorta is unremarkable measuring up to 2.7 cm. Distal aorta and bifurcation obscured by bowel gas. Other findings: None. IMPRESSION: 1. Grossly unremarkable abdominal ultrasound in this patient status post cholecystectomy. Electronically Signed   By: Sharlet SalinaMichael  Brown M.D.   On: 07/17/2020 23:30   US RENAL  Result Date: 07/17/2020 CLINICAL DATA:  Acute kidney injury EXAM: RENAL / URINARY TRACT ULTRASOUND COMPLETE COMPARISON:  None. FINDINGS: Right Kidney: Renal measurements: 10.3 x 3.8 x 4.4 cm = volume: 90 mL. Echogenicity within normal limits. No mass or hydronephrosis visualized. Left Kidney: Renal measurements: 9.6 x 4.8 x 4.3 cm = volume: 95 mL. Echogenicity within normal limits. No mass or hydronephrosis visualized. Bladder: Appears normal for degree of bladder distention. Other: None. IMPRESSION: No evidence of hydronephrosis or other acute renal abnormality. Electronically Signed   By: Malachy MoanHeath  McCullough M.D.   On: 07/17/2020 15:29   US Venous Img Lower Bilateral (DVT)  Result Date: 07/18/2020 CLINICAL DATA:  Bilateral lower extremity swelling for the past 3 weeks. Evaluate for DVT. EXAM: BILATERAL LOWER EXTREMITY VENOUS DOPPLER ULTRASOUND TECHNIQUE: Gray-scale sonography with graded compression, as well as color Doppler and duplex ultrasound were performed to evaluate the lower extremity deep venous systems from the level of the common femoral vein and including the common femoral, femoral, profunda femoral, popliteal and calf veins including the posterior tibial, peroneal and gastrocnemius veins when visible. The superficial great saphenous vein was also interrogated. Spectral Doppler was utilized to evaluate flow at rest and with distal augmentation maneuvers in the common femoral, femoral and popliteal veins. COMPARISON:  None. FINDINGS: Examination is degraded due to patient body habitus and poor sonographic window. RIGHT LOWER  EXTREMITY Common Femoral Vein: No evidence of thrombus. Normal compressibility, respiratory phasicity and response to augmentation. Saphenofemoral Junction: No evidence of thrombus. Normal compressibility and flow on color Doppler imaging. Profunda Femoral Vein: No evidence of thrombus. Normal compressibility and flow on color Doppler imaging. Femoral Vein: No evidence of thrombus. Normal compressibility, respiratory phasicity and response to augmentation. Popliteal Vein: No evidence of  thrombus. Normal compressibility, respiratory phasicity and response to augmentation. Calf Veins: No evidence of thrombus. Normal compressibility and flow on color Doppler imaging. Superficial Great Saphenous Vein: No evidence of thrombus. Normal compressibility. Venous Reflux:  None. Other Findings:  None. LEFT LOWER EXTREMITY Common Femoral Vein: No evidence of thrombus. Normal compressibility, respiratory phasicity and response to augmentation. Saphenofemoral Junction: No evidence of thrombus. Normal compressibility and flow on color Doppler imaging. Profunda Femoral Vein: No evidence of thrombus. Normal compressibility and flow on color Doppler imaging. Femoral Vein: No evidence of thrombus. Normal compressibility, respiratory phasicity and response to augmentation. Popliteal Vein: No evidence of thrombus. Normal compressibility, respiratory phasicity and response to augmentation. Calf Veins: No evidence of thrombus. Normal compressibility and flow on color Doppler imaging. Superficial Great Saphenous Vein: No evidence of thrombus. Normal compressibility. Venous Reflux:  None. Other Findings:  None. IMPRESSION: No evidence of DVT within either lower extremity. Electronically Signed   By: Simonne Come M.D.   On: 07/18/2020 14:41   US Venous Img Upper Bilat (DVT)  Result Date: 07/18/2020 CLINICAL DATA:  Bilateral upper extremity pain and edema for the past several weeks. Evaluate for DVT. EXAM: BILATERAL UPPER EXTREMITY VENOUS  DOPPLER ULTRASOUND TECHNIQUE: Gray-scale sonography with graded compression, as well as color Doppler and duplex ultrasound were performed to evaluate the bilateral upper extremity deep venous systems from the level of the subclavian vein and including the jugular, axillary, basilic, radial, ulnar and upper cephalic vein. Spectral Doppler was utilized to evaluate flow at rest and with distal augmentation maneuvers. COMPARISON:  None. FINDINGS: RIGHT UPPER EXTREMITY Internal Jugular Vein: No evidence of thrombus. Normal compressibility, respiratory phasicity and response to augmentation. Subclavian Vein: No evidence of thrombus. Normal compressibility, respiratory phasicity and response to augmentation. Axillary Vein: No evidence of thrombus. Normal compressibility, respiratory phasicity and response to augmentation. Cephalic Vein: No evidence of thrombus. Normal compressibility, respiratory phasicity and response to augmentation. Basilic Vein: No evidence of thrombus. Normal compressibility, respiratory phasicity and response to augmentation. Brachial Veins: No evidence of thrombus. Normal compressibility, respiratory phasicity and response to augmentation. Radial Veins: No evidence of thrombus. Normal compressibility, respiratory phasicity and response to augmentation. Ulnar Veins: No evidence of thrombus. Normal compressibility, respiratory phasicity and response to augmentation. Venous Reflux:  None. Other Findings:  None. LEFT UPPER EXTREMITY Internal Jugular Vein: No evidence of thrombus. Normal compressibility, respiratory phasicity and response to augmentation. Subclavian Vein: No evidence of thrombus. Normal compressibility, respiratory phasicity and response to augmentation. Axillary Vein: No evidence of thrombus. Normal compressibility, respiratory phasicity and response to augmentation. Cephalic Vein: While the proximal aspect of the cephalic vein appears widely patent (image 9), there is hypoechoic  occlusive thrombus involving the mid aspect of the cephalic vein at the level of the mid humerus (images 10 and 12). Basilic Vein: No evidence of thrombus. Normal compressibility, respiratory phasicity and response to augmentation. Brachial Veins: No evidence of thrombus. Normal compressibility, respiratory phasicity and response to augmentation. Radial Veins: No evidence of thrombus. Normal compressibility, respiratory phasicity and response to augmentation. Ulnar Veins: No evidence of thrombus. Normal compressibility, respiratory phasicity and response to augmentation. Venous Reflux:  None. Other Findings:  None. IMPRESSION: 1. No evidence of DVT within either upper extremity. 2. Examination is positive for short-segment occlusive superficial thrombophlebitis involving mid humeral aspect of the cephalic vein. Electronically Signed   By: Simonne Come M.D.   On: 07/18/2020 14:40   DG Chest Port 1 View  Result Date: 07/18/2020 CLINICAL DATA:  Acute  respiratory failure with hypoxia EXAM: PORTABLE CHEST 1 VIEW COMPARISON:  07/16/2020 FINDINGS: Single frontal view of the chest demonstrates an enlarged cardiac silhouette. Stable diffuse ground-glass airspace disease compatible with edema. No effusion or pneumothorax. No acute bony abnormalities. IMPRESSION: 1. Persistent bilateral ground-glass airspace disease consistent with pulmonary edema or atypical infection. No change since prior exam. Electronically Signed   By: Sharlet Salina M.D.   On: 07/18/2020 01:52   DG Chest Portable 1 View  Result Date: 07/16/2020 CLINICAL DATA:  Shortness of breath EXAM: PORTABLE CHEST 1 VIEW COMPARISON:  None. FINDINGS: Low lung volumes. Mild cardiomegaly. Diffuse interstitial prominence throughout the lungs. No effusions or pneumothorax. No acute bony abnormality. IMPRESSION: Cardiomegaly. Diffuse interstitial prominence could reflect interstitial edema or less likely atypical infection. Electronically Signed   By: Charlett Nose  M.D.   On: 07/16/2020 20:41   ECHOCARDIOGRAM COMPLETE  Result Date: 07/17/2020    ECHOCARDIOGRAM REPORT   Patient Name:   TARRI GUILFOIL Date of Exam: 07/17/2020 Medical Rec #:  [id number]       Height:       57.0 in Accession #:    [id number]      Weight:       231.5 lb Date of Birth:  February 20, 1965        BSA:          1.914 m Patient Age:    55 years        BP:           106/49 mmHg Patient Gender: F               HR:           71 bpm. Exam Location:  [facility] Procedure: 2D Echo, Color Doppler, Cardiac Doppler and Intracardiac            Opacification Agent Indications:     I63.9 Stroke  History:         Patient has no prior history of Echocardiogram examinations.                  Signs/Symptoms:Shortness of Breath; Risk Factors:Sleep Apnea                  and Current Smoker.  Sonographer:     Humphrey Rolls RDCS (AE) Referring Phys:  [id number] Cecille Po MELVIN Diagnosing Phys: Lorine Bears MD  Sonographer Comments: Suboptimal apical window. Image acquisition challenging due to patient body habitus and Image acquisition challenging due to uncooperative patient. IMPRESSIONS  1. Left ventricular ejection fraction, by estimation, is 50 to 55%. The left ventricle has low normal function. The left ventricle has no regional wall motion abnormalities. Left ventricular diastolic parameters are indeterminate.  2. Right ventricular systolic function is normal. The right ventricular size is normal. Tricuspid regurgitation signal is inadequate for assessing PA pressure.  3. Left atrial size was mildly dilated.  4. Right atrial size was mildly dilated.  5. The mitral valve is normal in structure. No evidence of mitral valve regurgitation. No evidence of mitral stenosis.  6. The aortic valve is normal in structure. Aortic valve regurgitation is mild. Mild aortic valve stenosis.  7. The inferior vena cava is dilated in size with <50% respiratory variability, suggesting right atrial pressure of 15 mmHg. FINDINGS  Left Ventricle: Left  ventricular ejection fraction, by estimation, is 50 to 55%. The left ventricle has low normal function. The left ventricle has no regional wall motion abnormalities. Definity contrast agent was  given IV to delineate the left ventricular endocardial borders. The left ventricular internal cavity size was normal in size. There is no left ventricular hypertrophy. Left ventricular diastolic parameters are indeterminate. Right Ventricle: The right ventricular size is normal. No increase in right ventricular wall thickness. Right ventricular systolic function is normal. Tricuspid regurgitation signal is inadequate for assessing PA pressure. Left Atrium: Left atrial size was mildly dilated. Right Atrium: Right atrial size was mildly dilated. Pericardium: There is no evidence of pericardial effusion. Mitral Valve: The mitral valve is normal in structure. No evidence of mitral valve regurgitation. No evidence of mitral valve stenosis. MV peak gradient, 3.7 mmHg. The mean mitral valve gradient is 2.0 mmHg. Tricuspid Valve: The tricuspid valve is normal in structure. Tricuspid valve regurgitation is mild . No evidence of tricuspid stenosis. Aortic Valve: The aortic valve is normal in structure. Aortic valve regurgitation is mild. Mild aortic stenosis is present. Aortic valve mean gradient measures 13.0 mmHg. Aortic valve peak gradient measures 20.8 mmHg. Aortic valve area, by VTI measures 1.59 cm. Pulmonic Valve: The pulmonic valve was normal in structure. Pulmonic valve regurgitation is trivial. No evidence of pulmonic stenosis. Aorta: The aortic root is normal in size and structure. Venous: The inferior vena cava is dilated in size with less than 50% respiratory variability, suggesting right atrial pressure of 15 mmHg. IAS/Shunts: No atrial level shunt detected by color flow Doppler.  LEFT VENTRICLE PLAX 2D LVIDd:         4.90 cm  Diastology LVIDs:         3.40 cm  LV e' medial:    7.51 cm/s LV PW:         1.10 cm  LV E/e'  medial:  12.0 LV IVS:        0.80 cm  LV e' lateral:   7.72 cm/s LVOT diam:     2.00 cm  LV E/e' lateral: 11.7 LV SV:         65 LV SV Index:   34 LVOT Area:     3.14 cm  RIGHT VENTRICLE RV Basal diam:  3.50 cm LEFT ATRIUM             Index       RIGHT ATRIUM           Index LA diam:        4.50 cm 2.35 cm/m  RA Area:     21.50 cm LA Vol (A2C):   47.4 ml 24.77 ml/m RA Volume:   65.60 ml  34.27 ml/m LA Vol (A4C):   66.3 ml 34.64 ml/m LA Biplane Vol: 58.2 ml 30.41 ml/m  AORTIC VALVE                    PULMONIC VALVE AV Area (Vmax):    1.37 cm     PV Vmax:       1.10 m/s AV Area (Vmean):   1.20 cm     PV Vmean:      67.600 cm/s AV Area (VTI):     1.59 cm     PV VTI:        0.185 m AV Vmax:           228.00 cm/s  PV Peak grad:  4.8 mmHg AV Vmean:          168.000 cm/s PV Mean grad:  2.0 mmHg AV VTI:            0.407 m AV  Peak Grad:      20.8 mmHg AV Mean Grad:      13.0 mmHg LVOT Vmax:         99.20 cm/s LVOT Vmean:        64.100 cm/s LVOT VTI:          0.206 m LVOT/AV VTI ratio: 0.51  AORTA Ao Root diam: 3.10 cm MITRAL VALVE MV Area (PHT): 4.89 cm    SHUNTS MV Area VTI:   2.10 cm    Systemic VTI:  0.21 m MV Peak grad:  3.7 mmHg    Systemic Diam: 2.00 cm MV Mean grad:  2.0 mmHg MV Vmax:       0.96 m/s MV Vmean:      64.1 cm/s MV Decel Time: 155 msec MV E velocity: 90.00 cm/s MV A velocity: 87.00 cm/s MV E/A ratio:  1.03 Lorine Bears MD Electronically signed by Lorine Bears MD Signature Date/Time: 07/17/2020/5:00:35 PM    Final    ECHO TEE  Result Date: 07/23/2020    TRANSESOPHOGEAL ECHO REPORT   Patient Name:   EVERLYN FARABAUGH Date of Exam: 07/23/2020 Medical Rec #:  [id number]       Height:       57.0 in Accession #:    [id number]      Weight:       202.2 lb Date of Birth:  [birth year]        BSA:          1.807 m Patient Age:    55 years        BP:           110/62 mmHg Patient Gender: F               HR:           95 bpm. Exam Location:  [facility] Procedure: Transesophageal Echo, Cardiac Doppler and Color  Doppler Indications:     Not listed on TEE check-in sheet  History:         Patient has prior history of Echocardiogram examinations, most                  recent 07/17/2020. Risk Factors:Sleep Apnea.  Sonographer:     Cristela Blue RDCS (AE) Referring Phys:  [id number] Lennon Alstrom Diagnosing Phys: Debbe Odea MD PROCEDURE: The transesophogeal probe was passed without difficulty through the esophogus of the patient. Sedation performed by performing physician. The patient developed no complications during the procedure. Study time, quality limited due to patient's  Poor respiratory function/respiratory distress. IMPRESSIONS  1. Left ventricular ejection fraction, by estimation, is 55 to 60%. The left ventricle has normal function.  2. Right ventricular systolic function is normal. The right ventricular size is normal.  3. No left atrial/left atrial appendage thrombus was detected.  4. The mitral valve is normal in structure. Mild mitral valve regurgitation.  5. 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 The aortic valve is tricuspid. Aortic valve regurgitation is severe.  6. Agitated saline contrast bubble study was negative, with no evidence of any interatrial shunt. FINDINGS  Left Ventricle: Left ventricular ejection fraction, by estimation, is 55 to 60%. The left ventricle has normal function. The left ventricular internal cavity size was normal in size. Right Ventricle: The right ventricular size is normal. No increase in right ventricular wall thickness. Right ventricular systolic function is normal. Left Atrium: Left atrial size  was normal in size. No left atrial/left atrial appendage thrombus was detected. Right Atrium: Right atrial size was normal in size. Pericardium: There is no evidence of pericardial effusion. Mitral Valve: The mitral valve is normal in structure. Mild mitral valve regurgitation. Tricuspid Valve: The  tricuspid valve is normal in structure. Tricuspid valve regurgitation is not demonstrated. 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 The aortic valve is tricuspid. Aortic valve regurgitation is severe. Pulmonic Valve: The pulmonic valve was grossly normal. Pulmonic valve regurgitation is not visualized. Aorta: The aortic root is normal in size and structure. IAS/Shunts: No atrial level shunt detected by color flow Doppler. Agitated saline contrast was given intravenously to evaluate for intracardiac shunting. Agitated saline contrast bubble study was negative, with no evidence of any interatrial shunt. Debbe Odea MD Electronically signed by Debbe Odea MD Signature Date/Time: 07/23/2020/3:48:06 PM    Final    CT CHEST ABDOMEN PELVIS WO CONTRAST  Result Date: 07/16/2020 CLINICAL DATA:  Shortness of breath. EXAM: CT CHEST, ABDOMEN AND PELVIS WITHOUT CONTRAST TECHNIQUE: Multidetector CT imaging of the chest, abdomen and pelvis was performed following the standard protocol without IV contrast. COMPARISON:  None. FINDINGS: CT CHEST FINDINGS Cardiovascular: Heart is mildly enlarged.  Aorta normal caliber. Mediastinum/Nodes: No mediastinal, hilar, or axillary adenopathy. Trachea and esophagus are unremarkable. Thyroid unremarkable. Lungs/Pleura: Ground-glass airspace opacities within the upper lobes bilaterally, left greater than right as well as left lower lobe. No effusions or pneumothorax. Musculoskeletal: Chest wall soft tissues are unremarkable. No acute bony abnormality. CT ABDOMEN PELVIS FINDINGS Hepatobiliary: No focal liver abnormality is seen. Status post cholecystectomy. No biliary dilatation. Pancreas: No focal abnormality or ductal dilatation. Spleen: No focal abnormality.  Normal size. Adrenals/Urinary Tract: Fullness of the left adrenal gland compatible with hyperplasia. Right adrenal gland and kidneys  unremarkable. No stones or hydronephrosis. Stomach/Bowel: Stomach, large and small bowel grossly unremarkable. Vascular/Lymphatic: Aortic atherosclerosis. No evidence of aneurysm or adenopathy. Reproductive: Prior hysterectomy.  No adnexal masses. Other: No free fluid or free air. Musculoskeletal: No acute bony abnormality. IMPRESSION: Cardiomegaly. Mild ground-glass opacities in the lungs, left greater than right. This could reflect asymmetric edema or infection. No acute findings in the abdomen or pelvis. Aortic atherosclerosis. Electronically Signed   By: Charlett Nose M.D.   On: 07/16/2020 22:13     Assessment and plan 1. Thrombocytopenia (HCC)   2. Macrocytic anemia   3. 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 Recommend patient to continue gradual tapering of the prednisone dose. Recommend 3 mg daily for 1 week followed by 2 mg daily for 1 week followed by 1 mg daily for 1 week and then stop. Prescription were sent to pharmacy.   #Anemia, MCV is slightly increased at 11.4-patient has folate deficiency and I recommend patient continue folic acid supplementation. Hemoglobin has continued to improve.  Orders Placed This Encounter  Procedures   CBC with Differential/Platelet    Standing Status:   Future    Standing Expiration Date:   08/13/2021   Comprehensive metabolic panel    Standing Status:   Future    Standing Expiration Date:   08/13/2021   Immature Platelet Fraction    Standing Status:   Future    Standing Expiration Date:   08/13/2021   Folate    Standing Status:   Future    Standing Expiration Date:  08/13/2021   Follow-up in 4 weeks.     Rickard Patience, MD, PhD Hematology Oncology [facility] Pager- [phone] 08/13/2020

## 2020-08-19 ENCOUNTER — Encounter: Payer: Medicaid Other | Admitting: Thoracic Surgery (Cardiothoracic Vascular Surgery)

## 2020-08-29 NOTE — Addendum Note (Signed)
Encounter addended by: Oneida Arenas on: 08/29/2020 11:51 AM  Actions taken: Imaging Exam ended

## 2020-09-01 ENCOUNTER — Telehealth: Payer: Self-pay | Admitting: *Deleted

## 2020-09-01 NOTE — Telephone Encounter (Signed)
Reviewed results and recommendations with patients husband per release form and scheduled her return appointment for August with provider. He verbalized understanding of our conversation with no further questions at this time.

## 2020-09-01 NOTE — Telephone Encounter (Signed)
Left voicemail message to call back for review of results.  

## 2020-09-01 NOTE — Telephone Encounter (Signed)
Patient spouse returning call  

## 2020-09-01 NOTE — Telephone Encounter (Signed)
-----   Message from Sondra Barges, PA-C sent at 09/01/2020 12:56 PM EDT ----- Heart monitor showed a predominant rhythm of sinus rhythm with 2 episodes of SVT (fast heart rhythm from the top portion of the heart) noted with the fastest and longest run lasting only 10 beats. No evidence of sustained arrhythmias, Afib, or atrial flutter.   Patient's follow up appointment is currently scheduled for 10/13. If possible, please see if she can be seen sooner due to need for likely further testing.

## 2020-09-10 ENCOUNTER — Encounter: Payer: Self-pay | Admitting: Oncology

## 2020-09-10 ENCOUNTER — Inpatient Hospital Stay (HOSPITAL_BASED_OUTPATIENT_CLINIC_OR_DEPARTMENT_OTHER): Payer: Medicaid Other | Admitting: Oncology

## 2020-09-10 ENCOUNTER — Inpatient Hospital Stay: Payer: Medicaid Other | Attending: Oncology

## 2020-09-10 VITALS — BP 130/82 | HR 90 | Temp 98.8°F | Resp 18 | Wt 194.3 lb

## 2020-09-10 DIAGNOSIS — D696 Thrombocytopenia, unspecified: Secondary | ICD-10-CM | POA: Diagnosis not present

## 2020-09-10 DIAGNOSIS — D649 Anemia, unspecified: Secondary | ICD-10-CM | POA: Insufficient documentation

## 2020-09-10 DIAGNOSIS — D693 Immune thrombocytopenic purpura: Secondary | ICD-10-CM | POA: Insufficient documentation

## 2020-09-10 DIAGNOSIS — D539 Nutritional anemia, unspecified: Secondary | ICD-10-CM | POA: Diagnosis not present

## 2020-09-10 DIAGNOSIS — E538 Deficiency of other specified B group vitamins: Secondary | ICD-10-CM

## 2020-09-10 LAB — COMPREHENSIVE METABOLIC PANEL
ALT: 11 U/L (ref 0–44)
AST: 17 U/L (ref 15–41)
Albumin: 3.5 g/dL (ref 3.5–5.0)
Alkaline Phosphatase: 86 U/L (ref 38–126)
Anion gap: 6 (ref 5–15)
BUN: 7 mg/dL (ref 6–20)
CO2: 24 mmol/L (ref 22–32)
Calcium: 9.1 mg/dL (ref 8.9–10.3)
Chloride: 105 mmol/L (ref 98–111)
Creatinine, Ser: 0.82 mg/dL (ref 0.44–1.00)
GFR, Estimated: >60 mL/min (ref >60)
Glucose, Bld: 114 mg/dL — ABNORMAL HIGH (ref 70–99)
Potassium: 3.7 mmol/L (ref 3.5–5.1)
Sodium: 135 mmol/L (ref 135–145)
Total Bilirubin: 0.3 mg/dL (ref 0.3–1.2)
Total Protein: 7.2 g/dL (ref 6.5–8.1)

## 2020-09-10 LAB — CBC WITH DIFFERENTIAL/PLATELET
Abs Immature Granulocytes: 0.04 10*3/uL (ref 0.00–0.07)
Basophils Absolute: 0.1 10*3/uL (ref 0.0–0.1)
Basophils Relative: 1 %
Eosinophils Absolute: 0.2 10*3/uL (ref 0.0–0.5)
Eosinophils Relative: 2 %
HCT: 35 % — ABNORMAL LOW (ref 36.0–46.0)
Hemoglobin: 11.4 g/dL — ABNORMAL LOW (ref 12.0–15.0)
Immature Granulocytes: 1 %
Lymphocytes Relative: 28 %
Lymphs Abs: 2.2 10*3/uL (ref 0.7–4.0)
MCH: 32.4 pg (ref 26.0–34.0)
MCHC: 32.6 g/dL (ref 30.0–36.0)
MCV: 99.4 fL (ref 80.0–100.0)
Monocytes Absolute: 0.6 10*3/uL (ref 0.1–1.0)
Monocytes Relative: 8 %
Neutro Abs: 4.8 10*3/uL (ref 1.7–7.7)
Neutrophils Relative %: 60 %
Platelets: 260 10*3/uL (ref 150–400)
RBC: 3.52 MIL/uL — ABNORMAL LOW (ref 3.87–5.11)
RDW: 14.6 % (ref 11.5–15.5)
WBC: 7.9 10*3/uL (ref 4.0–10.5)
nRBC: 0 % (ref 0.0–0.2)

## 2020-09-10 LAB — FOLATE: Folate: 42 ng/mL (ref >5.9)

## 2020-09-10 LAB — IMMATURE PLATELET FRACTION: Immature Platelet Fraction: 4 % (ref 1.2–8.6)

## 2020-09-10 NOTE — Progress Notes (Signed)
Pt here for follow up. No new concerns voiced.   

## 2020-09-10 NOTE — Progress Notes (Signed)
Hematology/Oncology Follow Up Note [facility]  [phone] [phone]  Patient Care Team: Pcp, No as PCP - General   Name of the patient: Brandi Holland  [id number]  15-Jan-1966   REASON FOR VISIT Follow-up for anemia and thrombocytopenia  PERTINENT HEMATOLOGY HISTORY  07/16/2020 - 08/01/2020 patient was admitted due to acute respiratory failure, change of mental status severe hyponatremia.  CT angiogram was negative for PE.  Acute mental status changes was felt to be secondary to stroke on MRI.  Cryptogenic  source.   TEE was negative for vegetations.   During her hospitalization patient was found to have anemia and thrombocytopenia.  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  DRV VT negative, patient was started on prednisone 40 mg in the hospital and a tapering down gradually.  At discharge her thrombocytopenia has completely resolved and prednisone was decreased to 10 mg. Anemia also improved.  Work-up showed no schistocytes, MAH A.    INTERVAL HISTORY Brandi Holland is a 55 y.o. female who has above history reviewed by me today presents for follow up visit for management of ITP  Accompanied by her husband.  She has finished steroid tapering 1 week ago. + insomnia   Review of Systems  Constitutional:  Negative for appetite change, chills, fatigue and fever.  HENT:   Negative for hearing loss and voice change.   Eyes:  Negative for eye problems.  Respiratory:  Negative for chest tightness and cough.   Cardiovascular:  Negative for chest pain.  Gastrointestinal:  Negative for abdominal distention, abdominal pain and blood in stool.  Endocrine: Negative for hot flashes.  Genitourinary:  Negative for difficulty urinating and frequency.   Musculoskeletal:  Negative for arthralgias.  Skin:  Negative for itching and rash.  Neurological:  Negative for extremity weakness.   Hematological:  Negative for adenopathy.  Psychiatric/Behavioral:  Positive for sleep disturbance. Negative for confusion.      Allergies  Allergen Reactions   Clarithromycin Swelling   Sulfa Antibiotics Swelling     Past Medical History:  Diagnosis Date   Arthritis    Asthma    Fibromyalgia    OSA (obstructive sleep apnea)    Peripheral neuropathy      Past Surgical History:  Procedure Laterality Date   TEE WITHOUT CARDIOVERSION N/A 07/23/2020   Procedure: TRANSESOPHAGEAL ECHOCARDIOGRAM (TEE);  Surgeon: Debbe Odea, MD;  Location: [facility] ORS;  Service: Cardiovascular;  Laterality: N/A;    Social History   Socioeconomic History   Marital status: Married    Spouse name: Not on file   Number of children: Not on file   Years of education: Not on file   Highest education level: Not on file  Occupational History   Not on file  Tobacco Use   Smoking status: Former    Types: Cigarettes    Quit date: 07/16/2020    Years since quitting: 0.1   Smokeless tobacco: Never  Substance and Sexual Activity   Alcohol use: Not on file   Drug use: Not on file   Sexual activity: Not on file  Other Topics Concern   Not on file  Social History Narrative   Not on file   Social Determinants of Health   Financial Resource Strain: Not on file  Food Insecurity: Not on file  Transportation Needs: Not on file  Physical Activity: Not on file  Stress: Not on file  Social Connections: Not on  file  Intimate Partner Violence: Not on file    Family History  Problem Relation Age of Onset   Chorea Mother    Emphysema Mother    Hypertension Mother    Thyroid disease Mother    Ulcers Mother    Dementia Father    Cancer Father    Cancer Sister    Chorea Sister    Heart disease Sister    Osteoporosis Sister      Current Outpatient Medications:    acetaminophen (TYLENOL) 325 MG tablet, Take 2 tablets (650 mg total) by mouth every 6 (six) hours as needed for mild pain (or Fever  >/= 101)., Disp: 90 tablet, Rfl: 0   aspirin EC 81 MG EC tablet, Take 1 tablet (81 mg total) by mouth daily. Swallow whole., Disp: 30 tablet, Rfl: 11   folic acid (FOLVITE) 1 MG tablet, Take 1 tablet (1 mg total) by mouth daily., Disp: 90 tablet, Rfl: 1   rosuvastatin (CRESTOR) 20 MG tablet, Take 1 tablet (20 mg total) by mouth daily., Disp: 90 tablet, Rfl: 1   clotrimazole (LOTRIMIN) 1 % cream, Apply 1 application topically 2 (two) times daily. (Patient not taking: Reported on 09/10/2020), Disp: , Rfl:    feeding supplement (ENSURE ENLIVE / ENSURE PLUS) LIQD, Take 237 mLs by mouth 2 (two) times daily between meals. (Patient not taking: Reported on 09/10/2020), Disp: 237 mL, Rfl: 12   furosemide (LASIX) 40 MG tablet, Take 1 tablet (40 mg total) by mouth daily. (Patient not taking: Reported on 09/10/2020), Disp: 30 tablet, Rfl:    nicotine (NICODERM CQ - DOSED IN MG/24 HOURS) 21 mg/24hr patch, Place 1 patch (21 mg total) onto the skin daily. (Patient not taking: Reported on 09/10/2020), Disp: 28 patch, Rfl: 0   polyethylene glycol (MIRALAX / GLYCOLAX) 17 g packet, Take 17 g by mouth daily as needed for mild constipation or moderate constipation. (Patient not taking: No sig reported), Disp: 14 each, Rfl: 0   predniSONE (DELTASONE) 1 MG tablet, Take 1 tablet (1 mg total) by mouth See admin instructions. Take 3mg  daily for 1 week, then 2mg  daily for 1 week, then 1mg  daily for 1 week, then stop. (Patient not taking: Reported on 09/10/2020), Disp: 42 tablet, Rfl: 0   PROAIR HFA 108 (90 Base) MCG/ACT inhaler, Inhale 2 puffs into the lungs every 4 (four) hours as needed. (Patient not taking: Reported on 09/10/2020), Disp: , Rfl:    senna-docusate (SENOKOT-S) 8.6-50 MG tablet, Take 1 tablet by mouth 2 (two) times daily. (Patient not taking: Reported on 09/10/2020), Disp: 60 tablet, Rfl: 1  Physical exam:  Vitals:   09/10/20 1441  BP: 130/82  Pulse: 90  Resp: 18  Temp: 98.8 F (37.1 C)  Weight: 194 lb 4.8 oz  (88.1 kg)   Physical Exam Constitutional:      General: She is not in acute distress.    Appearance: She is obese.     Comments: Patient sits in the wheelchair  HENT:     Head: Normocephalic and atraumatic.  Eyes:     General: No scleral icterus. Cardiovascular:     Rate and Rhythm: Normal rate and regular rhythm.     Heart sounds: Normal heart sounds.  Pulmonary:     Effort: Pulmonary effort is normal. No respiratory distress.     Breath sounds: No wheezing.  Abdominal:     General: Bowel sounds are normal. There is no distension.     Palpations: Abdomen is soft.  Musculoskeletal:        General: No deformity. Normal range of motion.     Cervical back: Normal range of motion and neck supple.  Skin:    General: Skin is warm and dry.     Findings: No erythema or rash.  Neurological:     Mental Status: She is alert. Mental status is at baseline.     Cranial Nerves: No cranial nerve deficit.     Coordination: Coordination normal.  Psychiatric:        Mood and Affect: Mood normal.    CMP Latest Ref Rng & Units 09/10/2020  Glucose 70 - 99 mg/dL 409(W)  BUN 6 - 20 mg/dL 7  Creatinine 1.19 - 1.47 mg/dL 8.29  Sodium 562 - 130 mmol/L 135  Potassium 3.5 - 5.1 mmol/L 3.7  Chloride 98 - 111 mmol/L 105  CO2 22 - 32 mmol/L 24  Calcium 8.9 - 10.3 mg/dL 9.1  Total Protein 6.5 - 8.1 g/dL 7.2  Total Bilirubin 0.3 - 1.2 mg/dL 0.3  Alkaline Phos 38 - 126 U/L 86  AST 15 - 41 U/L 17  ALT 0 - 44 U/L 11   CBC Latest Ref Rng & Units 09/10/2020  WBC 4.0 - 10.5 K/uL 7.9  Hemoglobin 12.0 - 15.0 g/dL 11.4(L)  Hematocrit 36.0 - 46.0 % 35.0(L)  Platelets 150 - 400 K/uL 260    RADIOGRAPHIC STUDIES: I have personally reviewed the radiological images as listed and agreed with the findings in the report. LONG TERM MONITOR-LIVE TELEMETRY (3-14 DAYS)  Result Date: 08/31/2020  Predominant rhyhtm was normal sinus rhythm with average heart rate 83bpm and ranged from 58 to 142bpm.  Nonsustained  atrial tachycardia up to 10 beats with fastet run at 136bpm.  Occasional PACs and PVCs.  Patch Wear Time:  14 days and 0 hours (2022-06-17T13:35:39-0400 to 2022-07-01T13:35:39-0400) Patient had a min HR of 58 bpm, max HR of 142 bpm, and avg HR of 83 bpm. Predominant underlying rhythm was Sinus Rhythm. QRS morphology changes were present throughout recording. 2 Supraventricular Tachycardia runs occurred, the run with the fastest interval lasting 10 beats with a max rate of 136 bpm (avg 124 bpm); the run with the fastest interval was also the longest. Isolated SVEs were rare (<1.0%), SVE Couplets were rare (<1.0%), and SVE Triplets were rare (<1.0%). Isolated VEs were rare (<1.0%), VE Couplets were rare (<1.0%), and no VE Triplets were present. Ventricular Bigeminy and Trigeminy were present.     Assessment and plan 1. Thrombocytopenia (HCC)   2. Macrocytic anemia   3. 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  Labs are reviewed and discussed with patient. Platelet count is stably normal.  Observation.   #Anemia, Stable Hb 11.4 Folate deficiency, continue folic acid  Orders Placed This Encounter  Procedures   CBC with Differential/Platelet    Standing Status:   Standing    Number of Occurrences:   2    Standing Expiration Date:   09/10/2021   CBC with Differential/Platelet    Standing Status:   Future    Standing Expiration Date:   09/10/2021   Comprehensive metabolic panel    Standing Status:   Future    Standing Expiration Date:   09/10/2021   Follow-up Monthly CBC Lab md  in 12 weeks.     Rickard Patience, MD, PhD Hematology Oncology [facility] at [facility] Pager- [phone] 09/10/2020

## 2020-10-09 ENCOUNTER — Ambulatory Visit: Payer: Medicaid Other | Admitting: Physician Assistant

## 2020-10-10 LAB — PNH PROFILE (-HIGH SENSITIVITY): Viability:: 99

## 2020-11-03 NOTE — Progress Notes (Signed)
Cardiology Office Note    Date:  11/04/2020   ID:  Brandi Holland, DOB 1966-01-24, MRN [id number]  PCP:  Pcp, No  Cardiologist:  Lorine Bears, MD  Electrophysiologist:  None   Chief Complaint: Hospital follow-up  History of Present Illness:   Brandi Holland is a 55 y.o. female with history of cryptogenic stroke, aortic insufficiency, mitral regurgitation, fibromyalgia, peripheral neuropathy, anemia, thrombocytopenia, asthma, arthritis, prior tobacco use, and OSA who presents for hospital follow-up as outlined below.  She was admitted to [facility] in early 06/2020 with worsening dyspnea and lower extremity swelling felt to be new onset CHF with an elevated BNP of 493.  Symptoms improved with diuresis.  CTA chest was negative for PE.  Lower extremity ultrasound was negative for DVT bilaterally.  She left prior to completion of echo.  Following discharge from [facility], she was very sedentary and spent the majority of her time laying in bed, not eating or drinking much, and smoking up to 2 packs/day.    Due to continued progression of confusion, her husband ultimately called EMS.  She was encephalopathic, hypoxic, and hypotensive upon EMS presentation.  She subsequently underwent complex admission for acute encephalopathy, cryptogenic stroke, and acute respiratory failure with hypoxia requiring supplemental oxygen, with admission being complicated by anemia requiring 3 units of packed red blood cells and IV iron, thrombocytopenia requiring prednisone, and acute on chronic HFpEF requiring IV diuresis.  MRI of the brain showed multifocal bilateral acute ischemia with the largest areas being in the lobes, though there were lesions in multiple vascular territories bilaterally.  These findings were most consistent with cardiac or aortic embolic source.  There was no hemorrhage or mass-effect.  Surface echo showed an EF of 50 to 55%, no regional wall motion abnormalities, indeterminate LV diastolic function  parameters, normal RV systolic function and ventricular cavity size, mild biatrial enlargement, mild aortic insufficiency, mild aortic stenosis, and an estimated right atrial pressure 15 mmHg.  TEE showed an EF of 55 to 60%, no left atrial/left atrial appendage thrombus, mild mitral regurgitation, severe aortic insufficiency, and a negative agitated saline bubble contrast study.  High-sensitivity troponin was mildly elevated peaking at 904 and felt to be in the setting of her acute respiratory failure, AKI, transfusion dependent anemia, and CVA.  No further ischemic testing was recommended.  See discharge summary for further details of hospitalization.  Outpatient cardiac monitoring showed a predominant rhythm of sinus with 2 runs of SVT with the fastest and longest interval lasting just 10 beats.  She comes in accompanied by her husband today.  Since her hospital discharge she indicates that she has felt well.  She denies any chest pain, dyspnea, palpitations, dizziness, presyncope, syncope.  No lower extremity swelling, or abdominal distention.  She denies any residual deficits from her CVA.  At baseline, she indicates she lives a very sedentary life secondary to chronic back pain, and is on disability.  She is largely bed/chair bound in this setting.  She has not followed up with PCP or neurology since her hospital discharge.  She is due for follow-up with hematology next month.  She has not taken furosemide since her hospital discharge.  She does report adherence to aspirin and rosuvastatin.  Since her hospital discharge she has not smoked.  She does not have any issues or complaints at this time.   Labs independently reviewed: 08/2020 - Hgb 11.4, PLT 260, potassium 3.7, BUN 7, serum creatinine 0.82, albumin 3.5, AST/ALT normal, A1c 5.3,  magnesium 2.2, TSH normal, direct LDL 46, TC 76, TG 69, HDL 13  Past Medical History:  Diagnosis Date   Arthritis    Asthma    Fibromyalgia    OSA (obstructive  sleep apnea)    Peripheral neuropathy     Past Surgical History:  Procedure Laterality Date   TEE WITHOUT CARDIOVERSION N/A 07/23/2020   Procedure: TRANSESOPHAGEAL ECHOCARDIOGRAM (TEE);  Surgeon: Debbe Odea, MD;  Location: [facility] ORS;  Service: Cardiovascular;  Laterality: N/A;    Current Medications: Current Meds  Medication Sig   acetaminophen (TYLENOL) 325 MG tablet Take 2 tablets (650 mg total) by mouth every 6 (six) hours as needed for mild pain (or Fever >/= 101).   aspirin EC 81 MG EC tablet Take 1 tablet (81 mg total) by mouth daily. Swallow whole.   Ensure (ENSURE) Take 237 mLs by mouth as needed.   folic acid (FOLVITE) 1 MG tablet Take 1 tablet (1 mg total) by mouth daily.   PROAIR HFA 108 (90 Base) MCG/ACT inhaler Inhale 2 puffs into the lungs every 4 (four) hours as needed.   rosuvastatin (CRESTOR) 20 MG tablet Take 1 tablet (20 mg total) by mouth daily.    Allergies:   Clarithromycin and Sulfa antibiotics   Social History   Socioeconomic History   Marital status: Married    Spouse name: Not on file   Number of children: Not on file   Years of education: Not on file   Highest education level: Not on file  Occupational History   Not on file  Tobacco Use   Smoking status: Former    Types: Cigarettes    Quit date: 07/16/2020    Years since quitting: 0.3   Smokeless tobacco: Never  Vaping Use   Vaping Use: Never used  Substance and Sexual Activity   Alcohol use: Not Currently   Drug use: Not on file   Sexual activity: Not on file  Other Topics Concern   Not on file  Social History Narrative   Not on file   Social Determinants of Health   Financial Resource Strain: Not on file  Food Insecurity: Not on file  Transportation Needs: Not on file  Physical Activity: Not on file  Stress: Not on file  Social Connections: Not on file     Family History:  The patient's family history includes Cancer in her father and sister; Chorea in her mother and sister;  Dementia in her father; Emphysema in her mother; Heart disease in her sister; Hypertension in her mother; Osteoporosis in her sister; Thyroid disease in her mother; Ulcers in her mother.  ROS:   Review of Systems  Constitutional:  Positive for malaise/fatigue. Negative for chills, diaphoresis, fever and weight loss.  HENT:  Negative for congestion.   Eyes:  Negative for discharge and redness.  Respiratory:  Negative for cough, hemoptysis, sputum production, shortness of breath and wheezing.   Cardiovascular:  Negative for chest pain, palpitations, orthopnea, claudication, leg swelling and PND.  Gastrointestinal:  Negative for abdominal pain, blood in stool, heartburn, melena, nausea and vomiting.  Genitourinary:  Negative for hematuria.  Musculoskeletal:  Positive for back pain. Negative for falls and myalgias.  Skin:  Negative for rash.  Neurological:  Positive for weakness. Negative for dizziness, tingling, tremors, sensory change, speech change, focal weakness and loss of consciousness.  Endo/Heme/Allergies:  Does not bruise/bleed easily.  Psychiatric/Behavioral:  Negative for substance abuse. The patient has insomnia. The patient is not nervous/anxious.   All  other systems reviewed and are negative.   EKGs/Labs/Other Studies Reviewed:    Studies reviewed were summarized above. The additional studies were reviewed today:  2D echo 07/17/2020: 1. Left ventricular ejection fraction, by estimation, is 50 to 55%. The  left ventricle has low normal function. The left ventricle has no regional  wall motion abnormalities. Left ventricular diastolic parameters are  indeterminate.   2. Right ventricular systolic function is normal. The right ventricular  size is normal. Tricuspid regurgitation signal is inadequate for assessing  PA pressure.   3. Left atrial size was mildly dilated.   4. Right atrial size was mildly dilated.   5. The mitral valve is normal in structure. No evidence of mitral  valve  regurgitation. No evidence of mitral stenosis.   6. The aortic valve is normal in structure. Aortic valve regurgitation is  mild. Mild aortic valve stenosis.   7. The inferior vena cava is dilated in size with <50% respiratory  variability, suggesting right atrial pressure of 15 mmHg. __________  TEE 07/23/2020: 1. Left ventricular ejection fraction, by estimation, is 55 to 60%. The  left ventricle has normal function.   2. Right ventricular systolic function is normal. The right ventricular  size is normal.   3. No left atrial/left atrial appendage thrombus was detected.   4. The mitral valve is normal in structure. Mild mitral valve  regurgitation.   5. 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 The aortic valve is  tricuspid. Aortic valve regurgitation is severe.   6. Agitated saline contrast bubble study was negative, with no evidence  of any interatrial shunt. __________  Luci Bank patch 07/2020: Patient had a min HR of 58 bpm, max HR of 142 bpm, and avg HR of 83 bpm. Predominant underlying rhythm was Sinus Rhythm. QRS morphology changes were present throughout recording. 2 Supraventricular Tachycardia runs occurred, the run with the fastest interval lasting 10 beats with a max rate of 136 bpm (avg 124 bpm); the run with the fastest interval was also the longest. Isolated SVEs were rare (<1.0%), SVE Couplets were rare (<1.0%), and SVE Triplets were rare (<1.0%). Isolated VEs were rare (<1.0%), VE Couplets were rare (<1.0%), and no VE Triplets were present. Ventricular Bigeminy and Trigeminy were present.     EKG:  EKG is ordered today.  The EKG ordered today demonstrates NSR, 76 bpm, LVH, poor R wave progression along the precordial leads, nonspecific ST-T changes  Recent Labs: 07/17/2020: TSH 3.567 07/19/2020: Magnesium 2.2 07/26/2020: B Natriuretic Peptide 294.1 09/10/2020: ALT 11; BUN 7; Creatinine, Ser  0.82; Hemoglobin 11.4; Platelets 260; Potassium 3.7; Sodium 135  Recent Lipid Panel    Component Value Date/Time   CHOL 76 07/17/2020 0534   TRIG 69 07/17/2020 0534   HDL 13 (L) 07/17/2020 0534   CHOLHDL 5.8 07/17/2020 0534   VLDL 14 07/17/2020 0534   LDLCALC NOT CALCULATED 07/17/2020 0534   LDLDIRECT 46.4 07/17/2020 1237    PHYSICAL EXAM:    VS:  BP 130/70 (BP Location: Right Arm, Patient Position: Sitting, Cuff Size: Large)   Pulse 76   Ht 5' (1.524 m)   Wt 196 lb (88.9 kg)   SpO2 98%   BMI 38.28 kg/m   BMI: Body mass index is 38.28 kg/m.  Physical Exam Vitals reviewed.  Constitutional:      Appearance: She is well-developed.  HENT:     Head: Normocephalic and atraumatic.  Eyes:     General:        Right eye: No discharge.        Left eye: No discharge.  Neck:     Vascular: No JVD.  Cardiovascular:     Rate and Rhythm: Normal rate and regular rhythm.     Pulses:          Posterior tibial pulses are 2+ on the right side and 2+ on the left side.     Heart sounds: S1 normal and S2 normal. Heart sounds not distant. No midsystolic click and no opening snap. Murmur heard.  High-pitched blowing decrescendo early diastolic murmur is present with a grade of 1/4 at the upper right sternal border radiating to the apex.    No friction rub.  Pulmonary:     Effort: Pulmonary effort is normal. No respiratory distress.     Breath sounds: Normal breath sounds. No decreased breath sounds, wheezing or rales.  Chest:     Chest wall: No tenderness.  Abdominal:     General: There is no distension.     Palpations: Abdomen is soft.     Tenderness: There is no abdominal tenderness.  Musculoskeletal:     Cervical back: Normal range of motion.     Comments: 5 out of 5 bilateral upper and lower extremity strength  Skin:    General: Skin is warm and dry.     Nails: There is no clubbing.  Neurological:     Mental Status: She is alert and oriented to person, place, and time.   Psychiatric:        Speech: Speech normal.        Behavior: Behavior normal.        Thought Content: Thought content normal.        Judgment: Judgment normal.    Wt Readings from Last 3 Encounters:  11/04/20 196 lb (88.9 kg)  09/10/20 194 lb 4.8 oz (88.1 kg)  08/13/20 189 lb (85.7 kg)     ASSESSMENT & PLAN:   Cryptogenic stroke: No residual deficits.  MRI findings concerning for cardiac or aortic etiology.  She has been referred to neurology to establish care.  I have also referred her to EP for consideration of loop recorder.  TEE with bubble study during her admission in the summer showed no evidence of interatrial shunt.  She remains on aspirin and rosuvastatin.  Aortic valve insufficiency/mitral regurgitation: TEE in 07/2020 showed mild mitral regurgitation with severe aortic valve insufficiency.  Recommendation was made to repeat TEE as an outpatient following diuresis.  She prefers to avoid procedures requiring sedation at this time.  Given this, we will begin evaluation with a transthoracic echo to reevaluate her valvulopathy.  She has previously been referred to CVTS, though canceled her appointment.  Pending echo results, with possible repeat TEE, repeat referral to cardiothoracic surgery may be indicated. HLD: LDL 46 with normal LFT.  She remains on rosuvastatin.  Elevated high-sensitivity troponin: Felt to be in the context of her acute illness as outlined above.  Never with chest pain.  Echo demonstrated preserved LV systolic function.  High risk for ischemic heart disease given multiple risk factors.  Pending results on echo she will need ischemic testing.  She remains on aspirin and statin as outlined above.  Anemia/thrombocytopenia: Requiring 3 units of PRBC during her admission in 07/2020.  She has been instructed to follow-up with hematology as directed.  No symptoms concerning for ongoing bleed at this time.  Disposition: F/u with Dr. Kirke Corin or an APP in 4  months.   Medication Adjustments/Labs and Tests Ordered: Current medicines are reviewed at length with the patient today.  Concerns regarding medicines are outlined above. Medication changes, Labs and Tests ordered today are summarized above and listed in the Patient Instructions accessible in Encounters.   Signed, Eula Listen, PA-C 11/04/2020 4:34 PM     [facility] [place] [address] [place], [place] [address] [phone]

## 2020-11-04 ENCOUNTER — Other Ambulatory Visit: Payer: Self-pay

## 2020-11-04 ENCOUNTER — Ambulatory Visit (INDEPENDENT_AMBULATORY_CARE_PROVIDER_SITE_OTHER): Payer: Medicaid Other | Admitting: Physician Assistant

## 2020-11-04 ENCOUNTER — Encounter: Payer: Self-pay | Admitting: Physician Assistant

## 2020-11-04 VITALS — BP 130/70 | HR 76 | Ht 60.0 in | Wt 196.0 lb

## 2020-11-04 DIAGNOSIS — I340 Nonrheumatic mitral (valve) insufficiency: Secondary | ICD-10-CM | POA: Diagnosis not present

## 2020-11-04 DIAGNOSIS — I351 Nonrheumatic aortic (valve) insufficiency: Secondary | ICD-10-CM

## 2020-11-04 DIAGNOSIS — R778 Other specified abnormalities of plasma proteins: Secondary | ICD-10-CM

## 2020-11-04 DIAGNOSIS — I639 Cerebral infarction, unspecified: Secondary | ICD-10-CM

## 2020-11-04 DIAGNOSIS — D696 Thrombocytopenia, unspecified: Secondary | ICD-10-CM

## 2020-11-04 DIAGNOSIS — D649 Anemia, unspecified: Secondary | ICD-10-CM

## 2020-11-04 DIAGNOSIS — R7989 Other specified abnormal findings of blood chemistry: Secondary | ICD-10-CM

## 2020-11-04 NOTE — Patient Instructions (Signed)
Medication Instructions:   Your physician recommends that you continue on your current medications as directed. Please refer to the Current Medication list given to you today.  *If you need a refill on your cardiac medications before your next appointment, please call your pharmacy*   Lab Work:  None ordered  Testing/Procedures:  Your physician has requested that you have an echocardiogram. Echocardiography is a painless test that uses sound waves to create images of your heart. It provides your doctor with information about the size and shape of your heart and how well your heart's chambers and valves are working. This procedure takes approximately one hour. There are no restrictions for this procedure.   Follow-Up: At [facility], you and your health needs are our priority.  As part of our continuing mission to provide you with exceptional heart care, we have created designated Provider Care Teams.  These Care Teams include your primary Cardiologist (physician) and Advanced Practice Providers (APPs -  Physician Assistants and Nurse Practitioners) who all work together to provide you with the care you need, when you need it.  We recommend signing up for the patient portal called "MyChart".  Sign up information is provided on this After Visit Summary.  MyChart is used to connect with patients for Virtual Visits (Telemedicine).  Patients are able to view lab/test results, encounter notes, upcoming appointments, etc.  Non-urgent messages can be sent to your provider as well.   To learn more about what you can do with MyChart, go to [email or web address].    Your next appointment:   4 month(s)  The format for your next appointment:   In Person  Provider:   You may see Lorine Bears, MD or one of the following Advanced Practice Providers on your designated Care Team:    Eula Listen, PA-C   Other Instructions  You have been referred to EP (Electrophysiology) for cryptogenic stroke  evaluation.  You have been referred to Neurology for cryptogenic stroke evaluation.

## 2020-11-11 ENCOUNTER — Other Ambulatory Visit: Payer: Self-pay

## 2020-11-11 ENCOUNTER — Inpatient Hospital Stay: Payer: Medicaid Other | Attending: Oncology

## 2020-11-11 DIAGNOSIS — D696 Thrombocytopenia, unspecified: Secondary | ICD-10-CM | POA: Diagnosis not present

## 2020-11-11 LAB — CBC WITH DIFFERENTIAL/PLATELET
Abs Immature Granulocytes: 0.02 10*3/uL (ref 0.00–0.07)
Basophils Absolute: 0 10*3/uL (ref 0.0–0.1)
Basophils Relative: 0 %
Eosinophils Absolute: 0.2 10*3/uL (ref 0.0–0.5)
Eosinophils Relative: 3 %
HCT: 38.9 % (ref 36.0–46.0)
Hemoglobin: 12.6 g/dL (ref 12.0–15.0)
Immature Granulocytes: 0 %
Lymphocytes Relative: 34 %
Lymphs Abs: 2.5 10*3/uL (ref 0.7–4.0)
MCH: 31.5 pg (ref 26.0–34.0)
MCHC: 32.4 g/dL (ref 30.0–36.0)
MCV: 97.3 fL (ref 80.0–100.0)
Monocytes Absolute: 0.6 10*3/uL (ref 0.1–1.0)
Monocytes Relative: 8 %
Neutro Abs: 3.9 10*3/uL (ref 1.7–7.7)
Neutrophils Relative %: 55 %
Platelets: 238 10*3/uL (ref 150–400)
RBC: 4 MIL/uL (ref 3.87–5.11)
RDW: 11.8 % (ref 11.5–15.5)
WBC: 7.2 10*3/uL (ref 4.0–10.5)
nRBC: 0 % (ref 0.0–0.2)

## 2020-11-27 ENCOUNTER — Ambulatory Visit: Payer: Medicaid Other | Admitting: Cardiology

## 2020-12-02 NOTE — Progress Notes (Signed)
Electrophysiology Office Note:    Date:  12/03/2020   ID:  Brandi Holland, DOB Jul 31, 1965, MRN [id number]  PCP:  Oneita Hurt, No  [facility] Cardiologist:  Lorine Bears, MD  [facility] Electrophysiologist:  Lanier Prude, MD   Referring MD: Sondra Barges, PA-C   Chief Complaint: Cryptogenic stroke  History of Present Illness:    Brandi Holland is a 55 y.o. female who presents for an evaluation of cryptogenic stroke at the request of Eula Listen. Their medical history includes aortic insufficiency, mitral regurgitation, fibromyalgia, peripheral neuropathy, anemia, thrombocytopenia, tobacco abuse and sleep apnea.  The patient saw Eula Listen on November 04, 2020.  This was after a hospitalization in May 2022 at [facility].  During that hospitalization she was diagnosed with encephalopathy, anemia and cryptogenic stroke.  There is no evidence of atrial fibrillation during the hospitalization.     Past Medical History:  Diagnosis Date   Arthritis    Asthma    Fibromyalgia    OSA (obstructive sleep apnea)    Peripheral neuropathy     Past Surgical History:  Procedure Laterality Date   TEE WITHOUT CARDIOVERSION N/A 07/23/2020   Procedure: TRANSESOPHAGEAL ECHOCARDIOGRAM (TEE);  Surgeon: Debbe Odea, MD;  Location: [facility] ORS;  Service: Cardiovascular;  Laterality: N/A;    Current Medications: No outpatient medications have been marked as taking for the 12/03/20 encounter (Office Visit) with Lanier Prude, MD.     Allergies:   Clarithromycin and Sulfa antibiotics   Social History   Socioeconomic History   Marital status: Married    Spouse name: Not on file   Number of children: Not on file   Years of education: Not on file   Highest education level: Not on file  Occupational History   Not on file  Tobacco Use   Smoking status: Former    Types: Cigarettes    Quit date: 07/16/2020    Years since quitting: 0.3   Smokeless tobacco: Never  Vaping Use    Vaping Use: Never used  Substance and Sexual Activity   Alcohol use: Not Currently   Drug use: Not on file   Sexual activity: Not on file  Other Topics Concern   Not on file  Social History Narrative   Not on file   Social Determinants of Health   Financial Resource Strain: Not on file  Food Insecurity: Not on file  Transportation Needs: Not on file  Physical Activity: Not on file  Stress: Not on file  Social Connections: Not on file     Family History: The patient's family history includes Cancer in her father and sister; Chorea in her mother and sister; Dementia in her father; Emphysema in her mother; Heart disease in her sister; Hypertension in her mother; Osteoporosis in her sister; Thyroid disease in her mother; Ulcers in her mother.  ROS:   Please see the history of present illness.    All other systems reviewed and are negative.  EKGs/Labs/Other Studies Reviewed:    The following studies were reviewed today:  August 29, 2020 ZIO personally reviewed Sinus rhythm with 2 brief SVTs lasting 10 beats No atrial fibrillation     Recent Labs: 07/17/2020: TSH 3.567 07/19/2020: Magnesium 2.2 07/26/2020: B Natriuretic Peptide 294.1 09/10/2020: ALT 11; BUN 7; Creatinine, Ser 0.82; Potassium 3.7; Sodium 135 11/11/2020: Hemoglobin 12.6; Platelets 238  Recent Lipid Panel    Component Value Date/Time   CHOL 76 07/17/2020 0534   TRIG 69 07/17/2020 0534  HDL 13 (L) 07/17/2020 0534   CHOLHDL 5.8 07/17/2020 0534   VLDL 14 07/17/2020 0534   LDLCALC NOT CALCULATED 07/17/2020 0534   LDLDIRECT 46.4 07/17/2020 1237    Physical Exam:    VS:  BP 122/68 (BP Location: Right Arm, Patient Position: Sitting, Cuff Size: Normal)   Pulse 80   Wt 196 lb (88.9 kg)   SpO2 97%   BMI 38.28 kg/m     Wt Readings from Last 3 Encounters:  12/03/20 196 lb (88.9 kg)  11/04/20 196 lb (88.9 kg)  09/10/20 194 lb 4.8 oz (88.1 kg)     GEN: Chronically ill-appearing.  In no acute distress.  In  wheelchair..  Obese HEENT: Normal NECK: No JVD; No carotid bruits LYMPHATICS: No lymphadenopathy CARDIAC: RRR, soft diastolic murmur.  2 out of 6 systolic murmur. RESPIRATORY:  Clear to auscultation without rales, wheezing or rhonchi  ABDOMEN: Soft, non-tender, non-distended MUSCULOSKELETAL:  No edema; No deformity  SKIN: Warm and dry NEUROLOGIC:  Alert and oriented x 3 PSYCHIATRIC:  Normal affect       ASSESSMENT:    1. Cryptogenic stroke (HCC)   2. Mitral valve insufficiency, unspecified etiology   3. Aortic valve insufficiency, etiology of cardiac valve disease unspecified    PLAN:    In order of problems listed above:   #Cryptogenic stroke No evidence of atrial fibrillation thus far.  We discussed using a loop recorder for ongoing surveillance for atrial fibrillation and she is interested in proceeding.  Unfortunately, her insurance is out of network for [place].  We will give her the procedure code and she will check with her insurance company regarding coverage and out-of-pocket costs.  She will let us know if she wishes to proceed.  #Valvular heart disease Echo scheduled for tomorrow.  Follows with Eula Listen, PA-C.   Medication Adjustments/Labs and Tests Ordered: Current medicines are reviewed at length with the patient today.  Concerns regarding medicines are outlined above.  No orders of the defined types were placed in this encounter.  No orders of the defined types were placed in this encounter.    Signed, Rossie Muskrat. Lalla Brothers, MD, [facility], [facility] 12/03/2020 3:24 PM    Electrophysiology [facility]

## 2020-12-03 ENCOUNTER — Encounter: Payer: Self-pay | Admitting: Cardiology

## 2020-12-03 ENCOUNTER — Telehealth: Payer: Self-pay

## 2020-12-03 ENCOUNTER — Ambulatory Visit (INDEPENDENT_AMBULATORY_CARE_PROVIDER_SITE_OTHER): Payer: Medicaid Other | Admitting: Cardiology

## 2020-12-03 ENCOUNTER — Other Ambulatory Visit: Payer: Self-pay

## 2020-12-03 VITALS — BP 122/68 | HR 80 | Wt 196.0 lb

## 2020-12-03 DIAGNOSIS — I639 Cerebral infarction, unspecified: Secondary | ICD-10-CM

## 2020-12-03 DIAGNOSIS — I340 Nonrheumatic mitral (valve) insufficiency: Secondary | ICD-10-CM | POA: Diagnosis not present

## 2020-12-03 DIAGNOSIS — I351 Nonrheumatic aortic (valve) insufficiency: Secondary | ICD-10-CM

## 2020-12-03 DIAGNOSIS — D649 Anemia, unspecified: Secondary | ICD-10-CM

## 2020-12-03 NOTE — Patient Instructions (Addendum)
Medication Instructions:  Your physician recommends that you continue on your current medications as directed. Please refer to the Current Medication list given to you today. *If you need a refill on your cardiac medications before your next appointment, please call your pharmacy*  Lab Work: None ordered. If you have labs (blood work) drawn today and your tests are completely normal, you will receive your results only by: MyChart Message (if you have MyChart) OR A paper copy in the mail If you have any lab test that is abnormal or we need to change your treatment, we will call you to review the results.  Testing/Procedures: None ordered.  Follow-Up: At [facility], you and your health needs are our priority.  As part of our continuing mission to provide you with exceptional heart care, we have created designated Provider Care Teams.  These Care Teams include your primary Cardiologist (physician) and Advanced Practice Providers (APPs -  Physician Assistants and Nurse Practitioners) who all work together to provide you with the care you need, when you need it.  Your next appointment:   Your physician wants you to follow-up as needed with Dr. Lalla Brothers  The code for loop recorder implant is [phone]  I will contact your insurance and determine if there is a local provider than can provide in network care

## 2020-12-03 NOTE — Telephone Encounter (Signed)
Left message at phone number listed on DPR.  Advised Pt that her procedure for loop implant needs to be cancelled.  Pt's insurance is [employer].  Requested call back.

## 2020-12-03 NOTE — Telephone Encounter (Signed)
With assistance from precert-Pt is in network with [facility] in [place].  Outreach made to their cardiology department.  Was advised that Dr. Darrold Junker provided loop implant services.  Referral placed to Dr. Darrold Junker.  Left detailed message advising of referral.  Await further needs.

## 2020-12-04 ENCOUNTER — Other Ambulatory Visit: Payer: Medicaid Other

## 2020-12-09 NOTE — Telephone Encounter (Signed)
Pt referred to [facility] clinic.

## 2020-12-15 ENCOUNTER — Inpatient Hospital Stay: Payer: Medicaid Other | Attending: Oncology

## 2020-12-15 ENCOUNTER — Encounter: Payer: Self-pay | Admitting: Oncology

## 2020-12-15 ENCOUNTER — Inpatient Hospital Stay (HOSPITAL_BASED_OUTPATIENT_CLINIC_OR_DEPARTMENT_OTHER): Payer: Medicaid Other | Admitting: Oncology

## 2020-12-15 ENCOUNTER — Other Ambulatory Visit: Payer: Self-pay

## 2020-12-15 VITALS — BP 130/67 | HR 77 | Temp 98.8°F

## 2020-12-15 DIAGNOSIS — E538 Deficiency of other specified B group vitamins: Secondary | ICD-10-CM

## 2020-12-15 DIAGNOSIS — R7989 Other specified abnormal findings of blood chemistry: Secondary | ICD-10-CM | POA: Insufficient documentation

## 2020-12-15 DIAGNOSIS — D693 Immune thrombocytopenic purpura: Secondary | ICD-10-CM | POA: Diagnosis not present

## 2020-12-15 DIAGNOSIS — D649 Anemia, unspecified: Secondary | ICD-10-CM | POA: Insufficient documentation

## 2020-12-15 DIAGNOSIS — D696 Thrombocytopenia, unspecified: Secondary | ICD-10-CM

## 2020-12-15 LAB — CBC WITH DIFFERENTIAL/PLATELET
Abs Immature Granulocytes: 0.01 10*3/uL (ref 0.00–0.07)
Basophils Absolute: 0 10*3/uL (ref 0.0–0.1)
Basophils Relative: 1 %
Eosinophils Absolute: 0.2 10*3/uL (ref 0.0–0.5)
Eosinophils Relative: 3 %
HCT: 37.8 % (ref 36.0–46.0)
Hemoglobin: 12.4 g/dL (ref 12.0–15.0)
Immature Granulocytes: 0 %
Lymphocytes Relative: 36 %
Lymphs Abs: 2.4 10*3/uL (ref 0.7–4.0)
MCH: 31.4 pg (ref 26.0–34.0)
MCHC: 32.8 g/dL (ref 30.0–36.0)
MCV: 95.7 fL (ref 80.0–100.0)
Monocytes Absolute: 0.4 10*3/uL (ref 0.1–1.0)
Monocytes Relative: 7 %
Neutro Abs: 3.5 10*3/uL (ref 1.7–7.7)
Neutrophils Relative %: 53 %
Platelets: 229 10*3/uL (ref 150–400)
RBC: 3.95 MIL/uL (ref 3.87–5.11)
RDW: 11.9 % (ref 11.5–15.5)
WBC: 6.5 10*3/uL (ref 4.0–10.5)
nRBC: 0 % (ref 0.0–0.2)

## 2020-12-15 LAB — COMPREHENSIVE METABOLIC PANEL
ALT: 12 U/L (ref 0–44)
AST: 16 U/L (ref 15–41)
Albumin: 3.7 g/dL (ref 3.5–5.0)
Alkaline Phosphatase: 95 U/L (ref 38–126)
Anion gap: 9 (ref 5–15)
BUN: 13 mg/dL (ref 6–20)
CO2: 29 mmol/L (ref 22–32)
Calcium: 8.9 mg/dL (ref 8.9–10.3)
Chloride: 100 mmol/L (ref 98–111)
Creatinine, Ser: 1.01 mg/dL — ABNORMAL HIGH (ref 0.44–1.00)
GFR, Estimated: >60 mL/min (ref >60)
Glucose, Bld: 104 mg/dL — ABNORMAL HIGH (ref 70–99)
Potassium: 4.4 mmol/L (ref 3.5–5.1)
Sodium: 138 mmol/L (ref 135–145)
Total Bilirubin: 0.7 mg/dL (ref 0.3–1.2)
Total Protein: 7.9 g/dL (ref 6.5–8.1)

## 2020-12-15 MED ORDER — FOLIC ACID 400 MCG PO TABS: 400.0000 ug | ORAL_TABLET | Freq: Every day | ORAL | 2 refills | Status: AC

## 2020-12-15 NOTE — Progress Notes (Signed)
Hematology/Oncology Follow Up Note Brandi [facility]  [phone] Fax:([phone]  Patient Care Team: Pcp, No as PCP - General [place] Ouch, MD as PCP - Cardiology (Cardiology) Lanier Prude, MD as PCP - Electrophysiology (Cardiology)   Name of the patient: Brandi Holland  [id number]  [birth year]   REASON FOR VISIT Follow-up for anemia and thrombocytopenia  PERTINENT HEMATOLOGY HISTORY  07/16/2020 - 08/01/2020 patient was admitted due to acute respiratory failure, change of mental status severe hyponatremia.  CT angiogram was negative for PE.  Acute mental status changes was felt to be secondary to stroke on MRI.  Cryptogenic  source.   TEE was negative for vegetations.   During her hospitalization patient was found to have anemia and thrombocytopenia.  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  DRV VT negative, patient was started on prednisone 40 mg in the [place] and a tapering down gradually.  At discharge her thrombocytopenia has completely resolved and prednisone was decreased to 10 mg. Anemia also improved.  Work-up showed no schistocytes, MAH A.  Cryptogenic stroke, encephalopathy  INTERVAL HISTORY Brandi Holland is a 55 y.o. female who has above history reviewed by me today presents for follow up visit for management of ITP  Accompanied by her husband.  She is a poor historian. No bleeding events. No new complaints.    Review of Systems  Constitutional:  Negative for appetite change, chills, fatigue and fever.  HENT:   Negative for hearing loss and voice change.   Eyes:  Negative for eye problems.  Respiratory:  Negative for chest tightness and cough.   Cardiovascular:  Negative for chest pain.  Gastrointestinal:  Negative for abdominal distention, abdominal pain and blood in stool.  Endocrine: Negative for hot flashes.  Genitourinary:  Negative for difficulty  urinating and frequency.   Musculoskeletal:  Negative for arthralgias.  Skin:  Negative for itching and rash.  Neurological:  Negative for extremity weakness.  Hematological:  Negative for adenopathy.  Psychiatric/Behavioral:  Negative for confusion.      Allergies  Allergen Reactions   Clarithromycin Swelling   Sulfa Antibiotics Swelling     Past Medical History:  Diagnosis Date   Arthritis    Asthma    Fibromyalgia    OSA (obstructive sleep apnea)    Peripheral neuropathy      Past Surgical History:  Procedure Laterality Date   TEE WITHOUT CARDIOVERSION N/A 07/23/2020   Procedure: TRANSESOPHAGEAL ECHOCARDIOGRAM (TEE);  Surgeon: Debbe Odea, MD;  Location: [facility] ORS;  Service: Cardiovascular;  Laterality: N/A;    Social History   Socioeconomic History   Marital status: Married    Spouse name: Not on file   Number of children: Not on file   Years of education: Not on file   Highest education level: Not on file  Occupational History   Not on file  Tobacco Use   Smoking status: Former    Types: Cigarettes    Quit date: 07/16/2020    Years since quitting: 0.4   Smokeless tobacco: Never  Vaping Use   Vaping Use: Never used  Substance and Sexual Activity   Alcohol use: Not Currently   Drug use: Not on file   Sexual activity: Not on file  Other Topics Concern   Not on file  Social History Narrative   Not on file   Social Determinants of Health   Financial Resource Strain: Not on file  Food  Insecurity: Not on file  Transportation Needs: Not on file  Physical Activity: Not on file  Stress: Not on file  Social Connections: Not on file  Intimate Partner Violence: Not on file    Family History  Problem Relation Age of Onset   Chorea Mother    Emphysema Mother    Hypertension Mother    Thyroid disease Mother    Ulcers Mother    Dementia Father    Cancer Father    Cancer Sister    Chorea Sister    Heart disease Sister    Osteoporosis Sister       Current Outpatient Medications:    aspirin EC 81 MG EC tablet, Take 1 tablet (81 mg total) by mouth daily. Swallow whole., Disp: 30 tablet, Rfl: 11   folic acid (V-R FOLIC ACID) A999333 MCG tablet, Take 1 tablet (400 mcg total) by mouth daily., Disp: 90 tablet, Rfl: 2   rosuvastatin (CRESTOR) 20 MG tablet, Take 1 tablet (20 mg total) by mouth daily., Disp: 90 tablet, Rfl: 1   acetaminophen (TYLENOL) 325 MG tablet, Take 2 tablets (650 mg total) by mouth every 6 (six) hours as needed for mild pain (or Fever >/= 101). (Patient not taking: Reported on 12/15/2020), Disp: 90 tablet, Rfl: 0   Ensure (ENSURE), Take 237 mLs by mouth as needed. (Patient not taking: Reported on 12/15/2020), Disp: , Rfl:    PROAIR HFA 108 (90 Base) MCG/ACT inhaler, Inhale 2 puffs into the lungs every 4 (four) hours as needed. (Patient not taking: Reported on 12/15/2020), Disp: , Rfl:   Physical exam:  Vitals:   12/15/20 1459  BP: 130/67  Pulse: 77  Temp: 98.8 F (37.1 C)  TempSrc: Tympanic   Physical Exam Constitutional:      General: She is not in acute distress.    Appearance: She is obese.     Comments: Patient sits in the wheelchair  HENT:     Head: Normocephalic and atraumatic.  Eyes:     General: No scleral icterus. Cardiovascular:     Rate and Rhythm: Normal rate and regular rhythm.     Heart sounds: Normal heart sounds.  Pulmonary:     Effort: Pulmonary effort is normal. No respiratory distress.     Breath sounds: No wheezing.  Abdominal:     General: Bowel sounds are normal. There is no distension.     Palpations: Abdomen is soft.  Musculoskeletal:        General: No deformity. Normal range of motion.     Cervical back: Normal range of motion and neck supple.  Skin:    General: Skin is warm and dry.     Findings: No erythema or rash.  Neurological:     Mental Status: She is alert. Mental status is at baseline.     Cranial Nerves: No cranial nerve deficit.     Coordination: Coordination  normal.  Psychiatric:        Mood and Affect: Mood normal.    CMP Latest Ref Rng & Units 12/15/2020  Glucose 70 - 99 mg/dL 104(H)  BUN 6 - 20 mg/dL 13  Creatinine 0.44 - 1.00 mg/dL 1.01(H)  Sodium 135 - 145 mmol/L 138  Potassium 3.5 - 5.1 mmol/L 4.4  Chloride 98 - 111 mmol/L 100  CO2 22 - 32 mmol/L 29  Calcium 8.9 - 10.3 mg/dL 8.9  Total Protein 6.5 - 8.1 g/dL 7.9  Total Bilirubin 0.3 - 1.2 mg/dL 0.7  Alkaline Phos 38 -  126 U/L 95  AST 15 - 41 U/L 16  ALT 0 - 44 U/L 12   CBC Latest Ref Rng & Units 12/15/2020  WBC 4.0 - 10.5 K/uL 6.5  Hemoglobin 12.0 - 15.0 g/dL 12.4  Hematocrit 36.0 - 46.0 % 37.8  Platelets 150 - 400 K/uL 229    RADIOGRAPHIC STUDIES: I have personally reviewed the radiological images as listed and agreed with the findings in the report. No results found.   Assessment and plan 1. Thrombocytopenia ([place])   2. Folate deficiency   3. Elevated serum creatinine    #Thrombocytopenia is likely secondary to peripheral destruction-likely ITP S/p tapering course of prednisone.  Labs are reviewed and discussed with patient. Platelet count is stable.   Folate deficiency, continue folic acid, recommend her to take folic acid A999333 mcg daily. Elevated creatinine, encourage oral hydration, avoid nephrotoxins  Orders Placed This Encounter  Procedures   CBC with Differential/Platelet    Standing Status:   Future    Standing Expiration Date:   12/15/2021   Comprehensive metabolic panel    Standing Status:   Future    Standing Expiration Date:   12/15/2021   Follow-up  Lab md  in 6 months     Earlie Server, MD, PhD Hematology Oncology [place] at [facility]  12/15/2020

## 2021-03-06 NOTE — Progress Notes (Deleted)
Cardiology Office Note    Date:  03/06/2021   ID:  Brandi Holland, DOB March 10, 1965, MRN [id number]  PCP:  Pcp, No  Cardiologist:  Lorine Bears, MD  Electrophysiologist:  Lanier Prude, MD   Chief Complaint: Follow up  History of Present Illness:   Brandi Holland is a 55 y.o. female with history of cryptogenic stroke, aortic insufficiency, mitral regurgitation, fibromyalgia, peripheral neuropathy, anemia, thrombocytopenia, asthma, arthritis, prior tobacco use, and OSA who presents for ***.   She was admitted to [facility] in early 06/2020 with worsening dyspnea and lower extremity swelling felt to be new onset CHF with an elevated BNP of 493.  Symptoms improved with diuresis.  CTA chest was negative for PE.  Lower extremity ultrasound was negative for DVT bilaterally.  She left prior to completion of echo.    Following discharge from [facility], she was very sedentary and spent the majority of her time laying in bed, not eating or drinking much, and smoking up to 2 packs/day.  Due to continued progression of confusion, her husband ultimately called EMS.  She was encephalopathic, hypoxic, and hypotensive upon EMS presentation.  She subsequently underwent complex admission for acute encephalopathy, cryptogenic stroke, and acute respiratory failure with hypoxia requiring supplemental oxygen, with admission being complicated by anemia requiring 3 units of packed red blood cells and IV iron, thrombocytopenia requiring prednisone, and acute on chronic HFpEF requiring IV diuresis in 07/2020.  MRI of the brain showed multifocal bilateral acute ischemia with the largest areas being in the lobes, though there were lesions in multiple vascular territories bilaterally.  These findings were most consistent with cardiac or aortic embolic source.  There was no hemorrhage or mass-effect.  Surface echo showed an EF of 50 to 55%, no regional wall motion abnormalities, indeterminate LV diastolic function parameters, normal RV  systolic function and ventricular cavity size, mild biatrial enlargement, mild aortic insufficiency, mild aortic stenosis, and an estimated right atrial pressure 15 mmHg.  TEE showed an EF of 55 to 60%, no left atrial/left atrial appendage thrombus, mild mitral regurgitation, severe aortic insufficiency, and a negative agitated saline bubble contrast study.  High-sensitivity troponin was mildly elevated peaking at 904 and felt to be in the setting of her acute respiratory failure, AKI, transfusion dependent anemia, and CVA.  No further ischemic testing was recommended.     Outpatient cardiac monitoring showed a predominant rhythm of sinus with 2 runs of SVT with the fastest and longest interval lasting just 10 beats.  She was seen in hospital follow-up in 10/2020 and was doing well from a cardiac perspective.  She denied any residual deficits from her CVA.  She was largely bed/chair bound and lived a sedentary lifestyle.  She had not followed up with her PCP or neurology since her hospital discharge.  She preferred to defer TEE for further evaluation of her valvular heart disease.  It was also noted she previously canceled her CVTS appointment.  Given her preference, we recommended a transthoracic echo which remains pending at this time.  She was referred to EP, though they were out of network, therefore ILR was not implanted.  She has subsequently established with Dr. Darrold Junker, with [facility], with plans to pursue ILR.  ***   Labs independently reviewed: 11/2020 - Hgb 12.4, PLT 229, potassium 4.4, BUN 13, serum creatinine 1.01, BUN 3.7, AST/ALT normal 08/2020 - A1c 5.3, magnesium 2.2, TSH normal, direct LDL 46, TC 76, TG 69, HDL 13  Past Medical History:  Diagnosis Date   Arthritis    Asthma    Fibromyalgia    OSA (obstructive sleep apnea)    Peripheral neuropathy     Past Surgical History:  Procedure Laterality Date   TEE WITHOUT CARDIOVERSION N/A 07/23/2020   Procedure:  TRANSESOPHAGEAL ECHOCARDIOGRAM (TEE);  Surgeon: Kate Sable, MD;  Location: [facility] ORS;  Service: Cardiovascular;  Laterality: N/A;    Current Medications: No outpatient medications have been marked as taking for the 03/11/21 encounter (Appointment) with Rise Mu, PA-C.    Allergies:   Clarithromycin and Sulfa antibiotics   Social History   Socioeconomic History   Marital status: Married    Spouse name: Not on file   Number of children: Not on file   Years of education: Not on file   Highest education level: Not on file  Occupational History   Not on file  Tobacco Use   Smoking status: Former    Types: Cigarettes    Quit date: 07/16/2020    Years since quitting: 0.6   Smokeless tobacco: Never  Vaping Use   Vaping Use: Never used  Substance and Sexual Activity   Alcohol use: Not Currently   Drug use: Not on file   Sexual activity: Not on file  Other Topics Concern   Not on file  Social History Narrative   Not on file   Social Determinants of Health   Financial Resource Strain: Not on file  Food Insecurity: Not on file  Transportation Needs: Not on file  Physical Activity: Not on file  Stress: Not on file  Social Connections: Not on file     Family History:  The patient's family history includes Cancer in her father and sister; Chorea in her mother and sister; Dementia in her father; Emphysema in her mother; Heart disease in her sister; Hypertension in her mother; Osteoporosis in her sister; Thyroid disease in her mother; Ulcers in her mother.  ROS:   ROS   EKGs/Labs/Other Studies Reviewed:    Studies reviewed were summarized above. The additional studies were reviewed today:  2D echo 07/17/2020: 1. Left ventricular ejection fraction, by estimation, is 50 to 55%. The  left ventricle has low normal function. The left ventricle has no regional  wall motion abnormalities. Left ventricular diastolic parameters are  indeterminate.   2. Right ventricular  systolic function is normal. The right ventricular  size is normal. Tricuspid regurgitation signal is inadequate for assessing  PA pressure.   3. Left atrial size was mildly dilated.   4. Right atrial size was mildly dilated.   5. The mitral valve is normal in structure. No evidence of mitral valve  regurgitation. No evidence of mitral stenosis.   6. The aortic valve is normal in structure. Aortic valve regurgitation is  mild. Mild aortic valve stenosis.   7. The inferior vena cava is dilated in size with <50% respiratory  variability, suggesting right atrial pressure of 15 mmHg. __________   TEE 07/23/2020: 1. Left ventricular ejection fraction, by estimation, is 55 to 60%. The  left ventricle has normal function.   2. Right ventricular systolic function is normal. The right ventricular  size is normal.   3. No left atrial/left atrial appendage thrombus was detected.   4. The mitral valve is normal in structure. Mild mitral valve  regurgitation.   5. 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 The aortic valve is  tricuspid. Aortic  valve regurgitation is severe.   6. Agitated saline contrast bubble study was negative, with no evidence  of any interatrial shunt. __________   Elwyn Reach patch 07/2020: Patient had a min HR of 58 bpm, max HR of 142 bpm, and avg HR of 83 bpm. Predominant underlying rhythm was Sinus Rhythm. QRS morphology changes were present throughout recording. 2 Supraventricular Tachycardia runs occurred, the run with the fastest interval lasting 10 beats with a max rate of 136 bpm (avg 124 bpm); the run with the fastest interval was also the longest. Isolated SVEs were rare (<1.0%), SVE Couplets were rare (<1.0%), and SVE Triplets were rare (<1.0%). Isolated VEs were rare (<1.0%), VE Couplets were rare (<1.0%), and no VE Triplets were present. Ventricular Bigeminy and Trigeminy were present.   EKG:   EKG is ordered today.  The EKG ordered today demonstrates ***  Recent Labs: 07/17/2020: TSH 3.567 07/19/2020: Magnesium 2.2 07/26/2020: B Natriuretic Peptide 294.1 12/15/2020: ALT 12; BUN 13; Creatinine, Ser 1.01; Hemoglobin 12.4; Platelets 229; Potassium 4.4; Sodium 138  Recent Lipid Panel    Component Value Date/Time   CHOL 76 07/17/2020 0534   TRIG 69 07/17/2020 0534   HDL 13 (L) 07/17/2020 0534   CHOLHDL 5.8 07/17/2020 0534   VLDL 14 07/17/2020 0534   LDLCALC NOT CALCULATED 07/17/2020 0534   LDLDIRECT 46.4 07/17/2020 1237    PHYSICAL EXAM:    VS:  There were no vitals taken for this visit.  BMI: There is no height or weight on file to calculate BMI.  Physical Exam  Wt Readings from Last 3 Encounters:  12/03/20 196 lb (88.9 kg)  11/04/20 196 lb (88.9 kg)  09/10/20 194 lb 4.8 oz (88.1 kg)     ASSESSMENT & PLAN:   Cryptogenic stroke:  Aortic valve insufficiency/mitral regurgitation:  History of elevated high-sensitivity troponin:  HLD: LDL 46 in 08/2020.  Anemia/thrombocytopenia: Stable and followed by hematology.   {Are you ordering a CV Procedure (e.g. stress test, cath, DCCV, TEE, etc)?   Press F2        :[id number]     Disposition: F/u with Dr. Fletcher Anon or an APP in ***, and EP as directed.   Medication Adjustments/Labs and Tests Ordered: Current medicines are reviewed at length with the patient today.  Concerns regarding medicines are outlined above. Medication changes, Labs and Tests ordered today are summarized above and listed in the Patient Instructions accessible in Encounters.   Signed, Christell Faith, PA-C 03/06/2021 2:01 PM     [facility] [address] [place] [address] [place], [place] [address] [phone]

## 2021-03-11 ENCOUNTER — Ambulatory Visit: Payer: Medicaid Other | Admitting: Physician Assistant

## 2021-04-21 ENCOUNTER — Telehealth: Payer: Self-pay | Admitting: Cardiology

## 2021-04-21 NOTE — Telephone Encounter (Signed)
Patient has not responded to attempts to schedule with [facility] neuro for referral.   ? ?Attempted to contact to discuss no ans.  Lmov to call office.  ?

## 2021-05-21 NOTE — Telephone Encounter (Signed)
FYI -  Closing referral

## 2021-06-08 ENCOUNTER — Telehealth: Payer: Self-pay | Admitting: Oncology

## 2021-06-08 ENCOUNTER — Inpatient Hospital Stay: Payer: Medicaid Other | Admitting: Oncology

## 2021-06-08 ENCOUNTER — Inpatient Hospital Stay: Payer: Medicaid Other

## 2021-06-08 NOTE — Telephone Encounter (Signed)
Received message from [facility] that patient wanted to cancel her 4/25 appt today. I attempted to reach the patient twice today. Left VM with patient to contact us to reschedule appts. ?

## 2021-07-14 ENCOUNTER — Encounter: Payer: Self-pay | Admitting: Oncology

## 2021-07-14 ENCOUNTER — Inpatient Hospital Stay: Payer: Medicaid Other | Attending: Oncology

## 2021-07-14 ENCOUNTER — Inpatient Hospital Stay (HOSPITAL_BASED_OUTPATIENT_CLINIC_OR_DEPARTMENT_OTHER): Payer: Medicaid Other | Admitting: Oncology

## 2021-07-14 VITALS — BP 130/81 | HR 81 | Temp 98.2°F | Resp 16 | Wt 236.0 lb

## 2021-07-14 DIAGNOSIS — Z79899 Other long term (current) drug therapy: Secondary | ICD-10-CM | POA: Insufficient documentation

## 2021-07-14 DIAGNOSIS — E538 Deficiency of other specified B group vitamins: Secondary | ICD-10-CM | POA: Insufficient documentation

## 2021-07-14 DIAGNOSIS — D649 Anemia, unspecified: Secondary | ICD-10-CM | POA: Diagnosis present

## 2021-07-14 DIAGNOSIS — Z87891 Personal history of nicotine dependence: Secondary | ICD-10-CM | POA: Insufficient documentation

## 2021-07-14 DIAGNOSIS — R7989 Other specified abnormal findings of blood chemistry: Secondary | ICD-10-CM

## 2021-07-14 DIAGNOSIS — D696 Thrombocytopenia, unspecified: Secondary | ICD-10-CM | POA: Diagnosis not present

## 2021-07-14 LAB — COMPREHENSIVE METABOLIC PANEL
ALT: 23 U/L (ref 0–44)
AST: 27 U/L (ref 15–41)
Albumin: 3.9 g/dL (ref 3.5–5.0)
Alkaline Phosphatase: 94 U/L (ref 38–126)
Anion gap: 5 (ref 5–15)
BUN: 14 mg/dL (ref 6–20)
CO2: 28 mmol/L (ref 22–32)
Calcium: 8.7 mg/dL — ABNORMAL LOW (ref 8.9–10.3)
Chloride: 104 mmol/L (ref 98–111)
Creatinine, Ser: 1.08 mg/dL — ABNORMAL HIGH (ref 0.44–1.00)
GFR, Estimated: 60 mL/min — ABNORMAL LOW (ref >60)
Glucose, Bld: 114 mg/dL — ABNORMAL HIGH (ref 70–99)
Potassium: 4.3 mmol/L (ref 3.5–5.1)
Sodium: 137 mmol/L (ref 135–145)
Total Bilirubin: 0.3 mg/dL (ref 0.3–1.2)
Total Protein: 8.2 g/dL — ABNORMAL HIGH (ref 6.5–8.1)

## 2021-07-14 LAB — CBC WITH DIFFERENTIAL/PLATELET
Abs Immature Granulocytes: 0.03 10*3/uL (ref 0.00–0.07)
Basophils Absolute: 0.1 10*3/uL (ref 0.0–0.1)
Basophils Relative: 1 %
Eosinophils Absolute: 0.1 10*3/uL (ref 0.0–0.5)
Eosinophils Relative: 2 %
HCT: 37.3 % (ref 36.0–46.0)
Hemoglobin: 12.2 g/dL (ref 12.0–15.0)
Immature Granulocytes: 0 %
Lymphocytes Relative: 26 %
Lymphs Abs: 1.9 10*3/uL (ref 0.7–4.0)
MCH: 31.6 pg (ref 26.0–34.0)
MCHC: 32.7 g/dL (ref 30.0–36.0)
MCV: 96.6 fL (ref 80.0–100.0)
Monocytes Absolute: 0.4 10*3/uL (ref 0.1–1.0)
Monocytes Relative: 5 %
Neutro Abs: 4.7 10*3/uL (ref 1.7–7.7)
Neutrophils Relative %: 66 %
Platelets: 228 10*3/uL (ref 150–400)
RBC: 3.86 MIL/uL — ABNORMAL LOW (ref 3.87–5.11)
RDW: 13.5 % (ref 11.5–15.5)
WBC: 7.2 10*3/uL (ref 4.0–10.5)
nRBC: 0 % (ref 0.0–0.2)

## 2021-07-14 NOTE — Progress Notes (Unsigned)
Pt in for follow up, denies any concerns today. 

## 2021-07-15 NOTE — Progress Notes (Signed)
Hematology/Oncology Progress note [phone] [phone]      Patient Care Team: Pcp, No as PCP - General Wellington Hampshire, MD as PCP - Cardiology (Cardiology) Vickie Epley, MD as PCP - Electrophysiology (Cardiology)   Name of the patient: Brandi Holland  [id number]  May 08, 1965   REASON FOR VISIT Follow-up for anemia and thrombocytopenia  PERTINENT HEMATOLOGY HISTORY  07/16/2020 - 08/01/2020 patient was admitted due to acute respiratory failure, change of mental status severe hyponatremia.  CT angiogram was negative for PE.  Acute mental status changes was felt to be secondary to stroke on MRI.  Cryptogenic  source.   TEE was negative for vegetations.   During her hospitalization patient was found to have anemia and thrombocytopenia.  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  DRV VT negative, patient was started on prednisone 40 mg in the hospital and a tapering down gradually.  At discharge her thrombocytopenia has completely resolved and prednisone was decreased to 10 mg. Anemia also improved.  Work-up showed no schistocytes, MAH A.  Cryptogenic stroke, encephalopathy  INTERVAL HISTORY Brandi Holland is a 56 y.o. female who has above history reviewed by me today presents for follow up visit for management of ITP  Patient was accompanied by her husband. She reports feeling well.  Denies any bleeding events.She is a poor historian    Review of Systems  Constitutional:  Negative for appetite change, chills, fatigue and fever.  HENT:   Negative for hearing loss and voice change.   Eyes:  Negative for eye problems.  Respiratory:  Negative for chest tightness and cough.   Cardiovascular:  Negative for chest pain.  Gastrointestinal:  Negative for abdominal distention, abdominal pain and blood in stool.  Endocrine: Negative for hot flashes.  Genitourinary:  Negative for difficulty  urinating and frequency.   Musculoskeletal:  Negative for arthralgias.  Skin:  Negative for itching and rash.  Neurological:  Negative for extremity weakness.  Hematological:  Negative for adenopathy.  Psychiatric/Behavioral:  Negative for confusion.      Allergies  Allergen Reactions   Clarithromycin Swelling   Sulfa Antibiotics Swelling     Past Medical History:  Diagnosis Date   Arthritis    Asthma    Fibromyalgia    OSA (obstructive sleep apnea)    Peripheral neuropathy      Past Surgical History:  Procedure Laterality Date   TEE WITHOUT CARDIOVERSION N/A 07/23/2020   Procedure: TRANSESOPHAGEAL ECHOCARDIOGRAM (TEE);  Surgeon: Kate Sable, MD;  Location: [facility] ORS;  Service: Cardiovascular;  Laterality: N/A;    Social History   Socioeconomic History   Marital status: Married    Spouse name: Not on file   Number of children: Not on file   Years of education: Not on file   Highest education level: Not on file  Occupational History   Not on file  Tobacco Use   Smoking status: Former    Types: Cigarettes    Quit date: 07/16/2020    Years since quitting: 0.9   Smokeless tobacco: Never  Vaping Use   Vaping Use: Never used  Substance and Sexual Activity   Alcohol use: Not Currently   Drug use: Not on file   Sexual activity: Not on file  Other Topics Concern   Not on file  Social History Narrative   Not on file   Social Determinants of Health   Financial Resource Strain: Not on file  Food Insecurity: Not on file  Transportation Needs: Not on file  Physical Activity: Not on file  Stress: Not on file  Social Connections: Not on file  Intimate Partner Violence: Not on file    Family History  Problem Relation Age of Onset   Chorea Mother    Emphysema Mother    Hypertension Mother    Thyroid disease Mother    Ulcers Mother    Dementia Father    Cancer Father    Cancer Sister    Chorea Sister    Heart disease Sister    Osteoporosis Sister       Current Outpatient Medications:    aspirin EC 81 MG EC tablet, Take 1 tablet (81 mg total) by mouth daily. Swallow whole., Disp: 30 tablet, Rfl: 11   folic acid (V-R FOLIC ACID) A999333 MCG tablet, Take 1 tablet (400 mcg total) by mouth daily., Disp: 90 tablet, Rfl: 2   acetaminophen (TYLENOL) 325 MG tablet, Take 2 tablets (650 mg total) by mouth every 6 (six) hours as needed for mild pain (or Fever >/= 101). (Patient not taking: Reported on 12/15/2020), Disp: 90 tablet, Rfl: 0   Ensure (ENSURE), Take 237 mLs by mouth as needed. (Patient not taking: Reported on 12/15/2020), Disp: , Rfl:    PROAIR HFA 108 (90 Base) MCG/ACT inhaler, Inhale 2 puffs into the lungs every 4 (four) hours as needed. (Patient not taking: Reported on 12/15/2020), Disp: , Rfl:    rosuvastatin (CRESTOR) 20 MG tablet, Take 1 tablet (20 mg total) by mouth daily. (Patient not taking: Reported on 07/14/2021), Disp: 90 tablet, Rfl: 1  Physical exam:  Vitals:   07/14/21 1450  BP: 130/81  Pulse: 81  Resp: 16  Temp: 98.2 F (36.8 C)  TempSrc: Tympanic  SpO2: 96%  Weight: 236 lb (107 kg)   Physical Exam Constitutional:      General: She is not in acute distress.    Appearance: She is obese.     Comments: Patient sits in the wheelchair  HENT:     Head: Normocephalic and atraumatic.  Eyes:     General: No scleral icterus. Cardiovascular:     Rate and Rhythm: Normal rate and regular rhythm.     Heart sounds: Normal heart sounds.  Pulmonary:     Effort: Pulmonary effort is normal. No respiratory distress.     Breath sounds: No wheezing.  Abdominal:     General: Bowel sounds are normal. There is no distension.     Palpations: Abdomen is soft.  Musculoskeletal:        General: No deformity. Normal range of motion.     Cervical back: Normal range of motion and neck supple.  Skin:    General: Skin is warm and dry.     Findings: No erythema or rash.  Neurological:     Mental Status: She is alert. Mental status is  at baseline.     Cranial Nerves: No cranial nerve deficit.     Coordination: Coordination normal.  Psychiatric:        Mood and Affect: Mood normal.       Latest Ref Rng & Units 07/14/2021    2:07 PM  CMP  Glucose 70 - 99 mg/dL 114    BUN 6 - 20 mg/dL 14    Creatinine 0.44 - 1.00 mg/dL 1.08    Sodium 135 - 145 mmol/L 137    Potassium 3.5 - 5.1 mmol/L 4.3    Chloride 98 -  111 mmol/L 104    CO2 22 - 32 mmol/L 28    Calcium 8.9 - 10.3 mg/dL 8.7    Total Protein 6.5 - 8.1 g/dL 8.2    Total Bilirubin 0.3 - 1.2 mg/dL 0.3    Alkaline Phos 38 - 126 U/L 94    AST 15 - 41 U/L 27    ALT 0 - 44 U/L 23        Latest Ref Rng & Units 07/14/2021    2:07 PM  CBC  WBC 4.0 - 10.5 K/uL 7.2    Hemoglobin 12.0 - 15.0 g/dL 12.2    Hematocrit 36.0 - 46.0 % 37.3    Platelets 150 - 400 K/uL 228      RADIOGRAPHIC STUDIES: I have personally reviewed the radiological images as listed and agreed with the findings in the report. No results found.   Assessment and plan 1. Thrombocytopenia ([place])   2. Folate deficiency   3. 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 Labs reviewed and discussed with patient The platelet count is stable. Labs are reviewed and discussed with patient. Platelet count is normal at 228.  Continue observation.  Elevated creatinine, likely secondary to chronic kidney disease.  Encourage oral hydration.  Avoid nephrotoxins. History of folate deficiency, repeat folate level last year was normal.  Will check folate acid level at next visit. Orders Placed This Encounter  Procedures   CBC with Differential/Platelet    Standing Status:   Future    Standing Expiration Date:   07/15/2022   Comprehensive metabolic panel    Standing Status:   Future    Standing Expiration Date:   07/15/2022   Follow-up 1 year    Earlie Server, MD, PhD Hematology  Oncology  07/15/2021

## 2022-06-14 ENCOUNTER — Telehealth: Payer: Self-pay | Admitting: Physician Assistant

## 2022-06-14 NOTE — Telephone Encounter (Signed)
Patient came in the office and would like a note stating she has had a stroke. Patient was summoned for jury duty and needs a letter stating this. Please call to discuss.

## 2022-07-15 ENCOUNTER — Inpatient Hospital Stay: Payer: Medicaid Other | Attending: Oncology

## 2022-07-15 ENCOUNTER — Encounter: Payer: Self-pay | Admitting: Oncology

## 2022-07-15 ENCOUNTER — Inpatient Hospital Stay (HOSPITAL_BASED_OUTPATIENT_CLINIC_OR_DEPARTMENT_OTHER): Payer: Medicaid Other | Admitting: Oncology

## 2022-07-15 VITALS — BP 161/65 | HR 71 | Temp 98.3°F | Resp 18 | Wt 246.8 lb

## 2022-07-15 DIAGNOSIS — Z0000 Encounter for general adult medical examination without abnormal findings: Secondary | ICD-10-CM

## 2022-07-15 DIAGNOSIS — D649 Anemia, unspecified: Secondary | ICD-10-CM | POA: Diagnosis not present

## 2022-07-15 DIAGNOSIS — D696 Thrombocytopenia, unspecified: Secondary | ICD-10-CM

## 2022-07-15 DIAGNOSIS — D693 Immune thrombocytopenic purpura: Secondary | ICD-10-CM | POA: Diagnosis present

## 2022-07-15 DIAGNOSIS — E538 Deficiency of other specified B group vitamins: Secondary | ICD-10-CM | POA: Diagnosis not present

## 2022-07-15 DIAGNOSIS — R7989 Other specified abnormal findings of blood chemistry: Secondary | ICD-10-CM

## 2022-07-15 LAB — COMPREHENSIVE METABOLIC PANEL
ALT: 13 U/L (ref 0–44)
AST: 21 U/L (ref 15–41)
Albumin: 3.5 g/dL (ref 3.5–5.0)
Alkaline Phosphatase: 74 U/L (ref 38–126)
Anion gap: 8 (ref 5–15)
BUN: 10 mg/dL (ref 6–20)
CO2: 25 mmol/L (ref 22–32)
Calcium: 8.7 mg/dL — ABNORMAL LOW (ref 8.9–10.3)
Chloride: 101 mmol/L (ref 98–111)
Creatinine, Ser: 0.89 mg/dL (ref 0.44–1.00)
GFR, Estimated: >60 mL/min (ref >60)
Glucose, Bld: 122 mg/dL — ABNORMAL HIGH (ref 70–99)
Potassium: 3.7 mmol/L (ref 3.5–5.1)
Sodium: 134 mmol/L — ABNORMAL LOW (ref 135–145)
Total Bilirubin: 0.4 mg/dL (ref 0.3–1.2)
Total Protein: 7.6 g/dL (ref 6.5–8.1)

## 2022-07-15 LAB — CBC WITH DIFFERENTIAL/PLATELET
Abs Immature Granulocytes: 0.02 10*3/uL (ref 0.00–0.07)
Basophils Absolute: 0 10*3/uL (ref 0.0–0.1)
Basophils Relative: 1 %
Eosinophils Absolute: 0.2 10*3/uL (ref 0.0–0.5)
Eosinophils Relative: 3 %
HCT: 36.7 % (ref 36.0–46.0)
Hemoglobin: 12 g/dL (ref 12.0–15.0)
Immature Granulocytes: 0 %
Lymphocytes Relative: 30 %
Lymphs Abs: 1.8 10*3/uL (ref 0.7–4.0)
MCH: 31.1 pg (ref 26.0–34.0)
MCHC: 32.7 g/dL (ref 30.0–36.0)
MCV: 95.1 fL (ref 80.0–100.0)
Monocytes Absolute: 0.3 10*3/uL (ref 0.1–1.0)
Monocytes Relative: 4 %
Neutro Abs: 3.6 10*3/uL (ref 1.7–7.7)
Neutrophils Relative %: 62 %
Platelets: 216 10*3/uL (ref 150–400)
RBC: 3.86 MIL/uL — ABNORMAL LOW (ref 3.87–5.11)
RDW: 12.9 % (ref 11.5–15.5)
WBC: 5.9 10*3/uL (ref 4.0–10.5)
nRBC: 0 % (ref 0.0–0.2)

## 2022-07-15 LAB — FOLATE: Folate: 13.8 ng/mL (ref >5.9)

## 2022-07-15 NOTE — Assessment & Plan Note (Signed)
Encourage patient to establish care with primary care provider.  A list of local primary care providers offices was provided to patient. She is overdue for colonoscopy. Refer to GI.

## 2022-07-15 NOTE — Progress Notes (Signed)
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 Responded to steroid.  Labs reviewed and discussed with patient The platelet count is stable. Platelet count is within normal limits.    Folate deficiency Resolved. Folate within normal limits  Healthcare maintenance Encourage patient to establish care with primary care provider.  A list of local primary care providers offices was provided to patient. She is overdue for colonoscopy. Refer to GI.    Orders Placed This Encounter  Procedures   Ambulatory referral to Gastroenterology    Referral Priority:   Routine    Referral Type:   Consultation    Referral Reason:   Specialty Services Required    Referred to Provider:   Stanton Kidney, MD    Number of Visits Requested:   1   Patient is discharged from my clinic. I recommend patient to establish care with primary care physician. Patient may re-establish care in the future if clinically indicated.  All questions were answered. The patient knows to call the clinic with any problems, questions or concerns.  Rickard Patience, MD, PhD [facility] Hematology Oncology 07/15/2022    PERTINENT HEMATOLOGY HISTORY  07/16/2020 - 08/01/2020 patient was admitted due to acute respiratory failure, change of mental status severe hyponatremia.  CT angiogram was negative for PE.  Acute mental status changes was felt to be secondary to stroke on MRI.  Cryptogenic  source.   TEE was negative for vegetations.   During her hospitalization patient was found to have anemia and thrombocytopenia.  Hematology oncology was  consulted Work-up showed that thrombocytopenia is likely due to peripheral destruction.  DRV VT negative, patient was started on prednisone 40 mg in the hospital and a tapering down gradually.  At discharge her thrombocytopenia has completely resolved and prednisone was decreased to 10 mg. Anemia also improved.  Work-up showed no schistocytes, MAH A.  Cryptogenic stroke, encephalopathy  INTERVAL HISTORY Brandi Holland is a 57 y.o. female who has above history reviewed by me today presents for follow up visit for management of ITP  Patient was accompanied by her husband. She reports feeling well.  Denies any bleeding events.She is a poor historian    Review of Systems  Constitutional:  Negative for appetite change, chills, fatigue and fever.  HENT:   Negative for hearing loss and voice change.   Eyes:  Negative for eye problems.  Respiratory:  Negative for chest tightness and cough.   Cardiovascular:  Negative for chest pain.  Gastrointestinal:  Negative for abdominal distention, abdominal pain and blood in stool.  Endocrine: Negative for hot flashes.  Genitourinary:  Negative for difficulty urinating and frequency.   Musculoskeletal:  Negative for arthralgias.  Skin:  Negative for itching and rash.  Neurological:  Negative for extremity weakness.  Hematological:  Negative for adenopathy.  Psychiatric/Behavioral:  Negative for confusion.       Allergies  Allergen Reactions   Clarithromycin Swelling   Sulfa Antibiotics Swelling     Past Medical History:  Diagnosis Date   Arthritis    Asthma    Fibromyalgia    OSA (obstructive sleep apnea)    Peripheral neuropathy  Past Surgical History:  Procedure Laterality Date   TEE WITHOUT CARDIOVERSION N/A 07/23/2020   Procedure: TRANSESOPHAGEAL ECHOCARDIOGRAM (TEE);  Surgeon: Debbe Odea, MD;  Location: [facility] ORS;  Service: Cardiovascular;  Laterality: N/A;    Social History   Socioeconomic History   Marital  status: Married    Spouse name: Not on file   Number of children: Not on file   Years of education: Not on file   Highest education level: Not on file  Occupational History   Not on file  Tobacco Use   Smoking status: Former    Types: Cigarettes    Quit date: 07/16/2020    Years since quitting: 1.9   Smokeless tobacco: Never  Vaping Use   Vaping Use: Never used  Substance and Sexual Activity   Alcohol use: Not Currently   Drug use: Not on file   Sexual activity: Not on file  Other Topics Concern   Not on file  Social History Narrative   Not on file   Social Determinants of Health   Financial Resource Strain: Not on file  Food Insecurity: Not on file  Transportation Needs: Not on file  Physical Activity: Not on file  Stress: Not on file  Social Connections: Not on file  Intimate Partner Violence: Not on file    Family History  Problem Relation Age of Onset   Chorea Mother    Emphysema Mother    Hypertension Mother    Thyroid disease Mother    Ulcers Mother    Dementia Father    Cancer Father    Cancer Sister    Chorea Sister    Heart disease Sister    Osteoporosis Sister      Current Outpatient Medications:    aspirin EC 81 MG EC tablet, Take 1 tablet (81 mg total) by mouth daily. Swallow whole., Disp: 30 tablet, Rfl: 11   folic acid (V-R FOLIC ACID) 400 MCG tablet, Take 1 tablet (400 mcg total) by mouth daily., Disp: 90 tablet, Rfl: 2   acetaminophen (TYLENOL) 325 MG tablet, Take 2 tablets (650 mg total) by mouth every 6 (six) hours as needed for mild pain (or Fever >/= 101). (Patient not taking: Reported on 12/15/2020), Disp: 90 tablet, Rfl: 0   Ensure (ENSURE), Take 237 mLs by mouth as needed. (Patient not taking: Reported on 12/15/2020), Disp: , Rfl:    PROAIR HFA 108 (90 Base) MCG/ACT inhaler, Inhale 2 puffs into the lungs every 4 (four) hours as needed. (Patient not taking: Reported on 12/15/2020), Disp: , Rfl:    rosuvastatin (CRESTOR) 20 MG tablet, Take 1  tablet (20 mg total) by mouth daily. (Patient not taking: Reported on 07/14/2021), Disp: 90 tablet, Rfl: 1  Physical exam:  Vitals:   07/15/22 1426  BP: (!) 161/65  Pulse: 71  Resp: 18  Temp: 98.3 F (36.8 C)  TempSrc: Tympanic  SpO2: 94%  Weight: 246 lb 12.8 oz (111.9 kg)   Physical Exam Constitutional:      General: She is not in acute distress.    Appearance: She is obese.     Comments: Patient sits in the wheelchair  HENT:     Head: Normocephalic and atraumatic.  Eyes:     General: No scleral icterus. Cardiovascular:     Rate and Rhythm: Normal rate and regular rhythm.     Heart sounds: Normal heart sounds.  Pulmonary:     Effort: Pulmonary effort is normal. No respiratory distress.     Breath  sounds: No wheezing.  Abdominal:     General: Bowel sounds are normal. There is no distension.     Palpations: Abdomen is soft.  Musculoskeletal:        General: No deformity. Normal range of motion.     Cervical back: Normal range of motion and neck supple.  Skin:    General: Skin is warm and dry.     Findings: No erythema or rash.  Neurological:     Mental Status: She is alert. Mental status is at baseline.     Cranial Nerves: No cranial nerve deficit.     Coordination: Coordination normal.  Psychiatric:        Mood and Affect: Mood normal.        Latest Ref Rng & Units 07/15/2022    1:50 PM  CMP  Glucose 70 - 99 mg/dL 161   BUN 6 - 20 mg/dL 10   Creatinine 0.96 - 1.00 mg/dL 0.45   Sodium 409 - 811 mmol/L 134   Potassium 3.5 - 5.1 mmol/L 3.7   Chloride 98 - 111 mmol/L 101   CO2 22 - 32 mmol/L 25   Calcium 8.9 - 10.3 mg/dL 8.7   Total Protein 6.5 - 8.1 g/dL 7.6   Total Bilirubin 0.3 - 1.2 mg/dL 0.4   Alkaline Phos 38 - 126 U/L 74   AST 15 - 41 U/L 21   ALT 0 - 44 U/L 13       Latest Ref Rng & Units 07/15/2022    1:50 PM  CBC  WBC 4.0 - 10.5 K/uL 5.9   Hemoglobin 12.0 - 15.0 g/dL 91.4   Hematocrit 78.2 - 46.0 % 36.7   Platelets 150 - 400 K/uL 216      RADIOGRAPHIC STUDIES: I have personally reviewed the radiological images as listed and agreed with the findings in the report. No results found.

## 2022-07-15 NOTE — Assessment & Plan Note (Signed)
Resolved. Folate within normal limits

## 2022-07-15 NOTE — Assessment & Plan Note (Signed)
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 Responded to steroid.  Labs reviewed and discussed with patient The platelet count is stable. Platelet count is within normal limits.

## 2024-03-06 ENCOUNTER — Other Ambulatory Visit: Payer: Self-pay | Admitting: Radiation Oncology

## 2024-03-06 DIAGNOSIS — S3992XA Unspecified injury of lower back, initial encounter: Secondary | ICD-10-CM

## 2024-03-16 ENCOUNTER — Ambulatory Visit
Admission: RE | Admit: 2024-03-16 | Discharge: 2024-03-16 | Disposition: A | Source: Ambulatory Visit | Attending: Radiation Oncology | Admitting: Radiation Oncology

## 2024-03-16 DIAGNOSIS — S3992XA Unspecified injury of lower back, initial encounter: Secondary | ICD-10-CM | POA: Diagnosis present
